# Patient Record
Sex: Male | Born: 1945 | Race: White | Hispanic: No | Marital: Married | Smoking: Current every day smoker
Health system: Southern US, Community
[De-identification: ages and names within clinical notes are randomized; demographics above are authoritative.]

## PROBLEM LIST (undated history)

## (undated) DIAGNOSIS — I10 Essential (primary) hypertension: Secondary | ICD-10-CM

## (undated) DIAGNOSIS — H269 Unspecified cataract: Secondary | ICD-10-CM

## (undated) DIAGNOSIS — I739 Peripheral vascular disease, unspecified: Secondary | ICD-10-CM

## (undated) DIAGNOSIS — M1990 Unspecified osteoarthritis, unspecified site: Secondary | ICD-10-CM

## (undated) DIAGNOSIS — K635 Polyp of colon: Secondary | ICD-10-CM

## (undated) DIAGNOSIS — E785 Hyperlipidemia, unspecified: Secondary | ICD-10-CM

## (undated) DIAGNOSIS — J302 Other seasonal allergic rhinitis: Secondary | ICD-10-CM

## (undated) DIAGNOSIS — C801 Malignant (primary) neoplasm, unspecified: Secondary | ICD-10-CM

## (undated) DIAGNOSIS — F17200 Nicotine dependence, unspecified, uncomplicated: Secondary | ICD-10-CM

## (undated) DIAGNOSIS — J439 Emphysema, unspecified: Secondary | ICD-10-CM

## (undated) HISTORY — DX: Emphysema, unspecified: J43.9

## (undated) HISTORY — DX: Peripheral vascular disease, unspecified: I73.9

## (undated) HISTORY — PX: HEMORRHOID BANDING: SHX5850

## (undated) HISTORY — DX: Unspecified osteoarthritis, unspecified site: M19.90

## (undated) HISTORY — DX: Nicotine dependence, unspecified, uncomplicated: F17.200

## (undated) HISTORY — DX: Polyp of colon: K63.5

## (undated) HISTORY — DX: Unspecified cataract: H26.9

## (undated) HISTORY — PX: CATARACT EXTRACTION, BILATERAL: SHX1313

## (undated) HISTORY — DX: Essential (primary) hypertension: I10

## (undated) HISTORY — DX: Other seasonal allergic rhinitis: J30.2

## (undated) HISTORY — DX: Hyperlipidemia, unspecified: E78.5

## (undated) HISTORY — DX: Malignant (primary) neoplasm, unspecified: C80.1

---

## 1978-12-10 HISTORY — PX: PILONIDAL CYST EXCISION: SHX744

## 1988-12-10 HISTORY — PX: KNEE ARTHROSCOPY: SUR90

## 1988-12-10 HISTORY — PX: OTHER SURGICAL HISTORY: SHX169

## 1993-12-10 DIAGNOSIS — C801 Malignant (primary) neoplasm, unspecified: Secondary | ICD-10-CM

## 1993-12-10 HISTORY — DX: Malignant (primary) neoplasm, unspecified: C80.1

## 1997-12-10 HISTORY — PX: INGUINAL HERNIA REPAIR: SUR1180

## 1997-12-10 HISTORY — PX: HERNIA REPAIR: SHX51

## 1999-01-13 ENCOUNTER — Encounter: Payer: Self-pay | Admitting: Neurology

## 1999-01-13 ENCOUNTER — Ambulatory Visit (HOSPITAL_COMMUNITY): Admission: RE | Admit: 1999-01-13 | Discharge: 1999-01-13 | Payer: Self-pay | Admitting: Neurology

## 2001-09-24 ENCOUNTER — Ambulatory Visit (HOSPITAL_COMMUNITY): Admission: RE | Admit: 2001-09-24 | Discharge: 2001-09-24 | Payer: Self-pay | Admitting: Orthopaedic Surgery

## 2001-09-24 ENCOUNTER — Encounter: Payer: Self-pay | Admitting: Orthopaedic Surgery

## 2002-01-23 ENCOUNTER — Ambulatory Visit (HOSPITAL_COMMUNITY): Admission: RE | Admit: 2002-01-23 | Discharge: 2002-01-23 | Payer: Self-pay | Admitting: *Deleted

## 2002-01-23 ENCOUNTER — Encounter (INDEPENDENT_AMBULATORY_CARE_PROVIDER_SITE_OTHER): Payer: Self-pay | Admitting: Specialist

## 2008-12-10 HISTORY — PX: KNEE ARTHROSCOPY: SUR90

## 2009-02-17 ENCOUNTER — Ambulatory Visit (HOSPITAL_BASED_OUTPATIENT_CLINIC_OR_DEPARTMENT_OTHER): Admission: RE | Admit: 2009-02-17 | Discharge: 2009-02-18 | Payer: Self-pay | Admitting: Orthopaedic Surgery

## 2011-03-22 LAB — POCT HEMOGLOBIN-HEMACUE: Hemoglobin: 15.7 g/dL (ref 13.0–17.0)

## 2011-04-24 NOTE — Op Note (Signed)
NAMERAHIEM, SCHELLINGER NO.:  [id number]   MEDICAL RECORD NO.:  [id number]          PATIENT TYPE:  AMB   LOCATION:  DSC                          FACILITY:  [facility]   PHYSICIAN:  Claude Manges. 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   PROCEDURE:  1. Diagnostic arthroscopy, left knee.  2. Partial medial and lateral meniscectomy.  3. Debridement of 3 compartments with synovectomy.   SURGEON:  Claude Manges. Cleophas Dunker, MD   ANESTHESIA:  General.   COMPLICATIONS:  None.   HISTORY:  A 65 year old gentleman visited the office recently with  recurrent episodes of left knee pain.  His work involves bending,  stooping, and squatting on a repetitive basis and feels like that is  partly responsible for the recurrent pain and swelling.  He has not had  any recent injury or trauma.  I did see him in 2066 with an MRI scan  revealing large effusion with tears of both medial and lateral menisci  without surgical intervention, but he is having recurrent pain to the  point of compromise and it is suggested that we consider arthroscopy.  He does have some evidence of degenerative change on his x-rays.   PROCEDURE:  The patient was comfortable on the operating table.  He was  placed under general anesthesia.  The left lower exam was placed in a  thigh holder.  Leg was then prepped with DuraPrep from thigh holder to  the The ankle.  Sterile draping was performed.   Diagnostic arthroscopy was performed using a medial and lateral  parapatellar tendon puncture site.   Arthroscope was initially placed  laterally.  There was a large, clear yellow joint effusion.  Diagnostic  arthroscopy revealed diffuse beefy red fronds of synovium in all 3  compartments and the intercondylar region.  There was diffuse  chondromalacia of the patella without exposed bone and without tilting.  I did not see any loose bodies.  I used the ArthroCare wand to perform a  synovectomy in the superior pouch and was able to control bleeding with  that as well.  I performed synovectomy in both gutters and the superior  pouch and debrided the patella with the Cuda shaver.   The medial compartment revealed tearing of the meniscus.  There was a  flap tear anteriorly that I debrided with the Cuda shaver and a tear  posteriorly was debrided with the basket forceps and then debrided the  entire remaining meniscus with ArthroCare wand to taper the surface.  There was an area of exposed subchondral bone about the size of a dime  posteromedially in the tibial plateau and diffuse articular cartilage  thinning of the femoral condyle.  There was also diffuse  synovitis that  I debrided with the ArthroCare wand.   The ACL appeared to be intact.  There was a negative anterior drawer  sign.   The lateral compartment revealed some fraying of the lateral meniscus  and a tear of the very posterior horn root that I debrided with the Cuda  shaver.  There was also some synovitis but less than it was medially and  this was debrided with the ArthroCare wand.  There was less synovitis  and degenerative change laterally than there was medially.  The joint  was then reinspected without evidence of loose material.  The puncture  sites were left open and infiltrated with Marcaine with epinephrine.  A  sterile bulky dressing was applied followed by an Ace bandage.   I did record the procedure with a disk and given it to the patient.   PLAN:  Crutches as necessary.  Oxycodone for pain.  Office in 1  week.      Claude Manges. Cleophas Dunker, M.D.  Electronically Signed     PWW/MEDQ  D:  02/17/2009  T:  02/18/2009  Job:  [id number]

## 2011-04-27 NOTE — Procedures (Signed)
[facility]  Patient:    [name], Edward Thornton Visit Number: [id number] MRN: [id number]          Service Type: END Location: ENDO Attending Physician:  Sabino Gasser Dictated by:   Sabino Gasser, M.D. Proc. Date: 01/23/02 Admit Date:  01/23/2002                             Procedure Report  PROCEDURE:  Colonoscopy.  INDICATIONS FOR PROCEDURE:  Rectal bleeding.  ANESTHESIA:  Demerol 100, Versed 10 mg.  DESCRIPTION OF PROCEDURE:  With the patient mildly sedated in the left lateral decubitus position, the Olympus videoscopic colonoscope was inserted in the rectum and passed under direct vision to the cecum identified by the ileocecal valve and appendiceal orifice both of which were photographed. From this point, the colonoscope was slowly withdrawn taking circumferential views of the entire colonic mucosa stopping first at 20 cm from the anal verge at which point, a small polyp was seen, photographed and removed using snare cautery technique on a setting of 20:20 blended current with pullback then to the rectum which appeared normal on direct view and showed four small polyps on retroflexed view all of which were removed some by snare cautery technique and some by hot biopsy forceps technique all with a setting of 20:20 blended current. There was good hemostasis. All the tissue was retrieved. The endoscope was straightened and withdrawn. The patients vital signs and pulse oximeter remained stable. The patient tolerated the procedure well without apparent complications.  FINDINGS:  Multiple polyps in the rectum and at 20 cm from the anal verge. Await biopsy report. The patient will call me for results and followup with me as an outpatient. Dictated by:   Sabino Gasser, M.D. Attending Physician:  Sabino Gasser DD:  01/23/02 TD:  01/23/02 Job: [id number]

## 2011-12-11 HISTORY — PX: COLONOSCOPY: SHX174

## 2012-06-05 ENCOUNTER — Encounter: Payer: Self-pay | Admitting: Gastroenterology

## 2012-07-11 ENCOUNTER — Encounter: Payer: Self-pay | Admitting: Gastroenterology

## 2012-07-11 ENCOUNTER — Ambulatory Visit (AMBULATORY_SURGERY_CENTER): Payer: Medicare Other | Admitting: *Deleted

## 2012-07-11 VITALS — Ht 73.0 in | Wt 194.6 lb

## 2012-07-11 DIAGNOSIS — Z1211 Encounter for screening for malignant neoplasm of colon: Secondary | ICD-10-CM

## 2012-07-11 MED ORDER — MOVIPREP 100 G PO SOLR: ORAL | Status: DC

## 2012-07-11 NOTE — Progress Notes (Signed)
Pt had colonoscopy in 2006 at [facility].  Pt does not remember MD name.  Family hx of colon cancer in father.  Talked with Medical Records associate at [facility]; records will have to come from warehouse.  They will call us on Tuesday 8/6 to let us status of obtaining records.  Scheduled for colonoscopy Friday 8/9 with Dr. Russella Dar.

## 2012-07-14 ENCOUNTER — Telehealth: Payer: Self-pay | Admitting: Gastroenterology

## 2012-07-14 NOTE — Telephone Encounter (Signed)
Pt notified that Movi prep is the number 1 choice of prep that Dr Christella Hartigan uses and it reduces the likely hood of having to repeat the colon because of not being cleaned out enough.  I left a 20 dollar rebate at the front desk for the pt to pick up.  The pt thanked me for my help

## 2012-07-23 ENCOUNTER — Telehealth: Payer: Self-pay | Admitting: Gastroenterology

## 2012-07-23 NOTE — Telephone Encounter (Signed)
Colonsocopy Dr. Virginia Rochester 01/2002 done for rectal bleeding; multiple polyps in rectum and at 20cm from anus; path showed all hyperplastic polyps

## 2012-07-24 ENCOUNTER — Telehealth: Payer: Self-pay | Admitting: Gastroenterology

## 2012-07-24 NOTE — Telephone Encounter (Signed)
See phone note from 07/14/12. Edward Thornton has already spoke with patient and left a rebate at the front desk for patient to pick up. The insurance company already denied the prescription for Movi prep which is why we left a voucher out front for patient to pick up.

## 2012-07-24 NOTE — Telephone Encounter (Signed)
See note from 07/24/12

## 2012-07-25 ENCOUNTER — Other Ambulatory Visit: Payer: Self-pay | Admitting: Gastroenterology

## 2012-07-25 ENCOUNTER — Ambulatory Visit (AMBULATORY_SURGERY_CENTER): Payer: Medicare Other | Admitting: Gastroenterology

## 2012-07-25 ENCOUNTER — Encounter: Payer: Self-pay | Admitting: Gastroenterology

## 2012-07-25 VITALS — BP 130/72 | HR 61 | Temp 94.9°F | Resp 18 | Ht 73.0 in | Wt 194.0 lb

## 2012-07-25 DIAGNOSIS — Z1211 Encounter for screening for malignant neoplasm of colon: Secondary | ICD-10-CM

## 2012-07-25 DIAGNOSIS — K649 Unspecified hemorrhoids: Secondary | ICD-10-CM

## 2012-07-25 DIAGNOSIS — K5730 Diverticulosis of large intestine without perforation or abscess without bleeding: Secondary | ICD-10-CM

## 2012-07-25 MED ORDER — SODIUM CHLORIDE 0.9 % IV SOLN: 500.0000 mL | INTRAVENOUS | Status: DC

## 2012-07-25 NOTE — Progress Notes (Signed)
Patient did not experience any of the following events: a burn prior to discharge; a fall within the facility; wrong site/side/patient/procedure/implant event; or a hospital transfer or hospital admission upon discharge from the facility. (G8907) Patient did not have preoperative order for IV antibiotic SSI prophylaxis. (G8918)  

## 2012-07-25 NOTE — Op Note (Signed)
[facility] [address] [place], [place]  [address]  COLONOSCOPY PROCEDURE REPORT  PATIENT:  Edward Thornton, Edward Thornton  MR#:  [id number] BIRTHDATE:  Apr 26, 1946, 66 yrs. old  GENDER:  male ENDOSCOPIST:  Rachael Fee, MD REF. BY:  Juline Patch, M.D. PROCEDURE DATE:  07/25/2012 PROCEDURE:  Colonoscopy 19147 ASA CLASS:  Class II INDICATIONS:  hyperplastic polyps 2003 MEDICATIONS:   Fentanyl 50 mcg IV, These medications were titrated to patient response per physician's verbal order, Versed 6 mg IV  DESCRIPTION OF PROCEDURE:   After the risks benefits and alternatives of the procedure were thoroughly explained, informed consent was obtained.  Digital rectal exam was performed and revealed no rectal masses.   The LB CF-[id number] endoscope was introduced through the anus and advanced to the cecum, which was identified by both the appendix and ileocecal valve, without limitations.  The quality of the prep was good..  The instrument was then slowly withdrawn as the colon was fully examined. <<PROCEDUREIMAGES>> FINDINGS:  Mild diverticulosis was found in the sigmoid to descending colon segments (see image1).  Internal and external Hemorrhoids were found.  This was otherwise a normal examination of the colon (see image3, image4, and image6).   Retroflexed views in the rectum revealed no abnormalities. COMPLICATIONS:  None  ENDOSCOPIC IMPRESSION: 1) Mild diverticulosis in the sigmoid to descending colon segments 2) Internal and external hemorrhoids 3) Otherwise normal examination; no polyps or cancers  RECOMMENDATIONS: 1) You should continue to follow colorectal cancer screening guidelines for "routine risk" patients with a repeat colonoscopy in 10 years. There is no need for FOBT (stool) testing for at least 5 years.  REPEAT EXAM:  10 years  ______________________________ Rachael Fee, MD  n. eSIGNED:   Rachael Fee at 07/25/2012 08:55 AM  Norvel Richards,  [phone]

## 2012-07-25 NOTE — Patient Instructions (Addendum)
One of your biggest health concerns is your smoking.  This increases your risk for most cancers and serious cardiovascular diseases such as strokes, heart attacks.  You should try your best to stop.  If you need assistance, please contact your PCP or Smoking Cessation Class at [facility] [phone]) or [place] (1-800-QUIT-NOW).   YOU HAD AN ENDOSCOPIC PROCEDURE TODAY AT [facility]: Refer to the procedure report that was given to you for any specific questions about what was found during the examination.  If the procedure report does not answer your questions, please call your gastroenterologist to clarify.  If you requested that your care partner not be given the details of your procedure findings, then the procedure report has been included in a sealed envelope for you to review at your convenience later.  YOU SHOULD EXPECT: Some feelings of bloating in the abdomen. Passage of more gas than usual.  Walking can help get rid of the air that was put into your GI tract during the procedure and reduce the bloating. If you had a lower endoscopy (such as a colonoscopy or flexible sigmoidoscopy) you may notice spotting of blood in your stool or on the toilet paper. If you underwent a bowel prep for your procedure, then you may not have a normal bowel movement for a few days.  DIET: Your first meal following the procedure should be a light meal and then it is ok to progress to your normal diet.  A half-sandwich or bowl of soup is an example of a good first meal.  Heavy or fried foods are harder to digest and may make you feel nauseous or bloated.  Likewise meals heavy in dairy and vegetables can cause extra gas to form and this can also increase the bloating.  Drink plenty of fluids but you should avoid alcoholic beverages for 24 hours.  ACTIVITY: Your care partner should take you home directly after the procedure.  You should plan to take it easy, moving slowly for the rest of  the day.  You can resume normal activity the day after the procedure however you should NOT DRIVE or use heavy machinery for 24 hours (because of the sedation medicines used during the test).    SYMPTOMS TO REPORT IMMEDIATELY: A gastroenterologist can be reached at any hour.  During normal business hours, 8:30 AM to 5:00 PM Monday through Friday, call [phone].  After hours and on weekends, please call the GI answering service at [phone] who will take a message and have the physician on call contact you.   Following lower endoscopy (colonoscopy or flexible sigmoidoscopy):  Excessive amounts of blood in the stool  Significant tenderness or worsening of abdominal pains  Swelling of the abdomen that is new, acute  Fever of 100F or higher  Following upper endoscopy (EGD)  Vomiting of blood or coffee ground material  New chest pain or pain under the shoulder blades  Painful or persistently difficult swallowing  New shortness of breath  Fever of 100F or higher  Black, tarry-looking stools  FOLLOW UP: If any biopsies were taken you will be contacted by phone or by letter within the next 1-3 weeks.  Call your gastroenterologist if you have not heard about the biopsies in 3 weeks.  Our staff will call the home number listed on your records the next business day following your procedure to check on you and address any questions or concerns that you may have at that time regarding  the information given to you following your procedure. This is a courtesy call and so if there is no answer at the home number and we have not heard from you through the emergency physician on call, we will assume that you have returned to your regular daily activities without incident.  SIGNATURES/CONFIDENTIALITY: You and/or your care partner have signed paperwork which will be entered into your electronic medical record.  These signatures attest to the fact that that the information above on your After Visit  Summary has been reviewed and is understood.  Full responsibility of the confidentiality of this discharge information lies with you and/or your care-partner.   HANDOUTS ON HEMORRHOIDS AND DIVERTICULOSIS, HIGH FIBER DIET

## 2012-07-28 ENCOUNTER — Telehealth: Payer: Self-pay | Admitting: *Deleted

## 2012-07-28 NOTE — Telephone Encounter (Signed)
Left message that called for f/u 

## 2012-11-24 ENCOUNTER — Other Ambulatory Visit: Payer: Self-pay | Admitting: Internal Medicine

## 2012-11-24 DIAGNOSIS — F17200 Nicotine dependence, unspecified, uncomplicated: Secondary | ICD-10-CM

## 2012-11-28 ENCOUNTER — Ambulatory Visit
Admission: RE | Admit: 2012-11-28 | Discharge: 2012-11-28 | Disposition: A | Discharge status: Home / Self Care | Payer: Medicare Other | Source: Ambulatory Visit | Attending: Internal Medicine | Admitting: Internal Medicine

## 2012-11-28 DIAGNOSIS — F17200 Nicotine dependence, unspecified, uncomplicated: Secondary | ICD-10-CM

## 2012-12-08 ENCOUNTER — Other Ambulatory Visit (HOSPITAL_COMMUNITY): Payer: Self-pay | Admitting: Internal Medicine

## 2012-12-08 DIAGNOSIS — R0602 Shortness of breath: Secondary | ICD-10-CM

## 2012-12-11 ENCOUNTER — Ambulatory Visit (HOSPITAL_COMMUNITY)
Admission: RE | Admit: 2012-12-11 | Discharge: 2012-12-11 | Disposition: A | Discharge status: Home / Self Care | Payer: Medicare Other | Source: Ambulatory Visit | Attending: Cardiovascular Disease | Admitting: Cardiovascular Disease

## 2012-12-11 DIAGNOSIS — R0602 Shortness of breath: Secondary | ICD-10-CM | POA: Insufficient documentation

## 2013-05-15 ENCOUNTER — Other Ambulatory Visit: Payer: Self-pay | Admitting: Dermatology

## 2014-06-28 ENCOUNTER — Other Ambulatory Visit: Payer: Self-pay | Admitting: Internal Medicine

## 2014-06-28 DIAGNOSIS — F17200 Nicotine dependence, unspecified, uncomplicated: Secondary | ICD-10-CM

## 2014-06-29 ENCOUNTER — Other Ambulatory Visit: Payer: Self-pay | Admitting: Internal Medicine

## 2014-06-29 DIAGNOSIS — M542 Cervicalgia: Secondary | ICD-10-CM

## 2014-06-29 DIAGNOSIS — F17200 Nicotine dependence, unspecified, uncomplicated: Secondary | ICD-10-CM

## 2014-07-02 ENCOUNTER — Ambulatory Visit
Admission: RE | Admit: 2014-07-02 | Discharge: 2014-07-02 | Disposition: A | Discharge status: Home / Self Care | Payer: Medicare Other | Source: Ambulatory Visit | Attending: Internal Medicine | Admitting: Internal Medicine

## 2014-07-02 ENCOUNTER — Encounter (INDEPENDENT_AMBULATORY_CARE_PROVIDER_SITE_OTHER): Payer: Self-pay

## 2014-07-02 ENCOUNTER — Other Ambulatory Visit: Payer: Self-pay | Admitting: Internal Medicine

## 2014-07-02 DIAGNOSIS — F17200 Nicotine dependence, unspecified, uncomplicated: Secondary | ICD-10-CM

## 2014-07-02 DIAGNOSIS — M542 Cervicalgia: Secondary | ICD-10-CM

## 2014-07-05 ENCOUNTER — Other Ambulatory Visit: Payer: Self-pay | Admitting: Internal Medicine

## 2014-07-05 DIAGNOSIS — E041 Nontoxic single thyroid nodule: Secondary | ICD-10-CM

## 2014-07-09 ENCOUNTER — Ambulatory Visit
Admission: RE | Admit: 2014-07-09 | Discharge: 2014-07-09 | Disposition: A | Discharge status: Home / Self Care | Payer: Medicare Other | Source: Ambulatory Visit | Attending: Internal Medicine | Admitting: Internal Medicine

## 2014-07-09 DIAGNOSIS — E041 Nontoxic single thyroid nodule: Secondary | ICD-10-CM

## 2014-07-12 ENCOUNTER — Other Ambulatory Visit: Payer: Self-pay | Admitting: Internal Medicine

## 2014-07-12 DIAGNOSIS — E041 Nontoxic single thyroid nodule: Secondary | ICD-10-CM

## 2014-07-15 ENCOUNTER — Other Ambulatory Visit (HOSPITAL_COMMUNITY)
Admission: RE | Admit: 2014-07-15 | Discharge: 2014-07-15 | Disposition: A | Discharge status: Home / Self Care | Payer: Medicare Other | Source: Ambulatory Visit | Attending: Interventional Radiology | Admitting: Interventional Radiology

## 2014-07-15 ENCOUNTER — Ambulatory Visit
Admission: RE | Admit: 2014-07-15 | Discharge: 2014-07-15 | Disposition: A | Discharge status: Home / Self Care | Payer: Medicare Other | Source: Ambulatory Visit | Attending: Internal Medicine | Admitting: Internal Medicine

## 2014-07-15 DIAGNOSIS — E041 Nontoxic single thyroid nodule: Secondary | ICD-10-CM | POA: Insufficient documentation

## 2015-08-23 ENCOUNTER — Encounter (INDEPENDENT_AMBULATORY_CARE_PROVIDER_SITE_OTHER): Payer: Self-pay

## 2015-08-23 ENCOUNTER — Encounter: Payer: Self-pay | Admitting: Internal Medicine

## 2015-08-23 ENCOUNTER — Ambulatory Visit (INDEPENDENT_AMBULATORY_CARE_PROVIDER_SITE_OTHER): Payer: Medicare Other | Admitting: Internal Medicine

## 2015-08-23 ENCOUNTER — Other Ambulatory Visit (INDEPENDENT_AMBULATORY_CARE_PROVIDER_SITE_OTHER): Payer: Medicare Other

## 2015-08-23 VITALS — BP 112/70 | HR 74 | Temp 98.2°F | Resp 17 | Ht 72.0 in | Wt 189.0 lb

## 2015-08-23 DIAGNOSIS — E785 Hyperlipidemia, unspecified: Secondary | ICD-10-CM

## 2015-08-23 DIAGNOSIS — Z0000 Encounter for general adult medical examination without abnormal findings: Secondary | ICD-10-CM

## 2015-08-23 DIAGNOSIS — I10 Essential (primary) hypertension: Secondary | ICD-10-CM | POA: Diagnosis not present

## 2015-08-23 DIAGNOSIS — Z23 Encounter for immunization: Secondary | ICD-10-CM

## 2015-08-23 LAB — COMPREHENSIVE METABOLIC PANEL
ALK PHOS: 62 U/L (ref 39–117)
ALT: 15 U/L (ref 0–53)
AST: 14 U/L (ref 0–37)
Albumin: 4.2 g/dL (ref 3.5–5.2)
BILIRUBIN TOTAL: 0.9 mg/dL (ref 0.2–1.2)
BUN: 18 mg/dL (ref 6–23)
CO2: 32 meq/L (ref 19–32)
Calcium: 9.9 mg/dL (ref 8.4–10.5)
Chloride: 102 mEq/L (ref 96–112)
Creatinine, Ser: 0.93 mg/dL (ref 0.40–1.50)
GFR: 85.75 mL/min (ref >60.00)
GLUCOSE: 78 mg/dL (ref 70–99)
Potassium: 3.8 mEq/L (ref 3.5–5.1)
SODIUM: 140 meq/L (ref 135–145)
TOTAL PROTEIN: 7.2 g/dL (ref 6.0–8.3)

## 2015-08-23 LAB — LIPID PANEL
CHOL/HDL RATIO: 4
Cholesterol: 148 mg/dL (ref 0–200)
HDL: 40.4 mg/dL (ref >39.00)
LDL Cholesterol: 76 mg/dL (ref 0–99)
NONHDL: 107.15
Triglycerides: 155 mg/dL — ABNORMAL HIGH (ref 0.0–149.0)
VLDL: 31 mg/dL (ref 0.0–40.0)

## 2015-08-23 NOTE — Patient Instructions (Signed)
It was a pleasure to meet you today.   We will check the labs and call you back with the results even if everything is normal.   We have given you the flu shot.  Think about stopping smoking as this is one of the best investments in your future health that you can make.   Smoking Cessation Quitting smoking is important to your health and has many advantages. However, it is not always easy to quit since nicotine is a very addictive drug. Oftentimes, people try 3 times or more before being able to quit. This document explains the best ways for you to prepare to quit smoking. Quitting takes hard work and a lot of effort, but you can do it. ADVANTAGES OF QUITTING SMOKING  You will live longer, feel better, and live better.  Your body will feel the impact of quitting smoking almost immediately.  Within 20 minutes, blood pressure decreases. Your pulse returns to its normal level.  After 8 hours, carbon monoxide levels in the blood return to normal. Your oxygen level increases.  After 24 hours, the chance of having a heart attack starts to decrease. Your breath, hair, and body stop smelling like smoke.  After 48 hours, damaged nerve endings begin to recover. Your sense of taste and smell improve.  After 72 hours, the body is virtually free of nicotine. Your bronchial tubes relax and breathing becomes easier.  After 2 to 12 weeks, lungs can hold more air. Exercise becomes easier and circulation improves.  The risk of having a heart attack, stroke, cancer, or lung disease is greatly reduced.  After 1 year, the risk of coronary heart disease is cut in half.  After 5 years, the risk of stroke falls to the same as a nonsmoker.  After 10 years, the risk of lung cancer is cut in half and the risk of other cancers decreases significantly.  After 15 years, the risk of coronary heart disease drops, usually to the level of a nonsmoker.  If you are pregnant, quitting smoking will improve your  chances of having a healthy baby.  The people you live with, especially any children, will be healthier.  You will have extra money to spend on things other than cigarettes. QUESTIONS TO THINK ABOUT BEFORE ATTEMPTING TO QUIT You may want to talk about your answers with your health care provider.  Why do you want to quit?  If you tried to quit in the past, what helped and what did not?  What will be the most difficult situations for you after you quit? How will you plan to handle them?  Who can help you through the tough times? Your family? Friends? A health care provider?  What pleasures do you get from smoking? What ways can you still get pleasure if you quit? Here are some questions to ask your health care provider:  How can you help me to be successful at quitting?  What medicine do you think would be best for me and how should I take it?  What should I do if I need more help?  What is smoking withdrawal like? How can I get information on withdrawal? GET READY  Set a quit date.  Change your environment by getting rid of all cigarettes, ashtrays, matches, and lighters in your home, car, or work. Do not let people smoke in your home.  Review your past attempts to quit. Think about what worked and what did not. GET SUPPORT AND ENCOURAGEMENT You have a  better chance of being successful if you have help. You can get support in many ways.  Tell your family, friends, and coworkers that you are going to quit and need their support. Ask them not to smoke around you.  Get individual, group, or telephone counseling and support. Programs are available at [employer] and health centers. Call your local health department for information about programs in your area.  Spiritual beliefs and practices may help some smokers quit.  Download a "quit meter" on your computer to keep track of quit statistics, such as how long you have gone without smoking, cigarettes not smoked, and money  saved.  Get a self-help book about quitting smoking and staying off tobacco. [place] yourself from urges to smoke. Talk to someone, go for a walk, or occupy your time with a task.  Change your normal routine. Take a different route to work. Drink tea instead of coffee. Eat breakfast in a different place.  Reduce your stress. Take a hot bath, exercise, or read a book.  Plan something enjoyable to do every day. Reward yourself for not smoking.  Explore interactive web-based programs that specialize in helping you quit. GET MEDICINE AND USE IT CORRECTLY Medicines can help you stop smoking and decrease the urge to smoke. Combining medicine with the above behavioral methods and support can greatly increase your chances of successfully quitting smoking.  Nicotine replacement therapy helps deliver nicotine to your body without the negative effects and risks of smoking. Nicotine replacement therapy includes nicotine gum, lozenges, inhalers, nasal sprays, and skin patches. Some may be available over-the-counter and others require a prescription.  Antidepressant medicine helps people abstain from smoking, but how this works is unknown. This medicine is available by prescription.  Nicotinic receptor partial agonist medicine simulates the effect of nicotine in your brain. This medicine is available by prescription. Ask your health care provider for advice about which medicines to use and how to use them based on your health history. Your health care provider will tell you what side effects to look out for if you choose to be on a medicine or therapy. Carefully read the information on the package. Do not use any other product containing nicotine while using a nicotine replacement product.  RELAPSE OR DIFFICULT SITUATIONS Most relapses occur within the first 3 months after quitting. Do not be discouraged if you start smoking again. Remember, most people try several times  before finally quitting. You may have symptoms of withdrawal because your body is used to nicotine. You may crave cigarettes, be irritable, feel very hungry, cough often, get headaches, or have difficulty concentrating. The withdrawal symptoms are only temporary. They are strongest when you first quit, but they will go away within 10-14 days. To reduce the chances of relapse, try to:  Avoid drinking alcohol. Drinking lowers your chances of successfully quitting.  Reduce the amount of caffeine you consume. Once you quit smoking, the amount of caffeine in your body increases and can give you symptoms, such as a rapid heartbeat, sweating, and anxiety.  Avoid smokers because they can make you want to smoke.  Do not let weight gain distract you. Many smokers will gain weight when they quit, usually less than 10 pounds. Eat a healthy diet and stay active. You can always lose the weight gained after you quit.  Find ways to improve your mood other than smoking. FOR MORE INFORMATION  www.smokefree.gov  Document Released: 11/20/2001 Document Revised: 04/12/2014 Document  Reviewed: 03/06/2012 Exit[employer] Patient Information 2015 [place], [place]. This information is not intended to replace advice given to you by your health care provider. Make sure you discuss any questions you have with your health care provider.

## 2015-08-26 ENCOUNTER — Encounter: Payer: Self-pay | Admitting: Internal Medicine

## 2015-08-26 DIAGNOSIS — I10 Essential (primary) hypertension: Secondary | ICD-10-CM | POA: Insufficient documentation

## 2015-08-26 DIAGNOSIS — E785 Hyperlipidemia, unspecified: Secondary | ICD-10-CM | POA: Insufficient documentation

## 2015-08-26 NOTE — Assessment & Plan Note (Signed)
Continue lipitor 20 mg daily and O3FA. Checking lipid panel today and adjust as indicated.

## 2015-08-26 NOTE — Progress Notes (Signed)
   Subjective:    Patient ID: Edward Thornton, male    DOB: [birth year], 69 y.o.   MRN: [id number]  HPI The patient is a 69 YO man coming in for continuation of his medical conditions with new provider. He has hyperlipidemia (taking lipitor without problems or complications), his blood pressure (taking losartan/hctz and amlodipine and BP at goal, no known complications). He denies any new complaints.   PMH, [facility], social history reviewed and updated.   Review of Systems  Constitutional: Negative for fever, activity change, appetite change, fatigue and unexpected weight change.  HENT: Negative.   Eyes: Negative.   Respiratory: Negative for cough, chest tightness, shortness of breath and wheezing.   Cardiovascular: Negative for chest pain, palpitations and leg swelling.  Gastrointestinal: Negative for nausea, abdominal pain, diarrhea, constipation and abdominal distention.  Musculoskeletal: Negative.   Skin: Negative.   Neurological: Negative.   Psychiatric/Behavioral: Negative.       Objective:   Physical Exam  Constitutional: He is oriented to person, place, and time. He appears well-developed and well-nourished.  HENT:  Head: Normocephalic and atraumatic.  Eyes: EOM are normal.  Neck: Normal range of motion.  Cardiovascular: Normal rate and regular rhythm.   No murmur heard. Pulmonary/Chest: Effort normal and breath sounds normal. No respiratory distress. He has no wheezes. He has no rales.  Abdominal: Soft. Bowel sounds are normal. He exhibits no distension. There is no tenderness. There is no rebound.  Musculoskeletal: He exhibits no edema.  Neurological: He is alert and oriented to person, place, and time. Coordination normal.  Skin: Skin is warm and dry.  Psychiatric: He has a normal mood and affect.   Filed Vitals:   08/23/15 0903  BP: 112/70  Pulse: 74  Temp: 98.2 F (36.8 C)  TempSrc: Oral  Resp: 17  Height: 6' (1.829 m)  Weight: 189 lb (85.73 kg)  SpO2: 94%       Assessment & Plan:  Flu shot given at visit.

## 2015-08-26 NOTE — Assessment & Plan Note (Signed)
BP at goal on losartan/hctz and amlodipine. Checking labs today and adjust as indicated. No side effects and no known complications.

## 2015-11-08 ENCOUNTER — Telehealth: Payer: Self-pay | Admitting: *Deleted

## 2015-11-08 MED ORDER — ATORVASTATIN CALCIUM 20 MG PO TABS: 20.0000 mg | ORAL_TABLET | Freq: Every day | ORAL | Status: DC

## 2015-11-08 NOTE — Telephone Encounter (Signed)
Received call pt states [facility] has been trying to get renewal on his Astorvastatin but haven't received response back from md. Inform pt will send new rx to [facility] .Edward Thornton

## 2016-01-18 ENCOUNTER — Telehealth: Payer: Self-pay | Admitting: *Deleted

## 2016-01-18 MED ORDER — LOSARTAN POTASSIUM-HCTZ 100-25 MG PO TABS: 1.0000 | ORAL_TABLET | Freq: Every day | ORAL | Status: DC

## 2016-01-18 MED ORDER — FISH OIL 1200 MG PO CAPS: 1200.0000 mg | ORAL_CAPSULE | Freq: Every day | ORAL | Status: DC

## 2016-01-18 MED ORDER — ATORVASTATIN CALCIUM 20 MG PO TABS: 20.0000 mg | ORAL_TABLET | Freq: Every day | ORAL | Status: DC

## 2016-01-18 MED ORDER — VITAMIN D 1000 UNITS PO TABS: 1000.0000 [IU] | ORAL_TABLET | Freq: Two times a day (BID) | ORAL | Status: DC

## 2016-01-18 NOTE — Telephone Encounter (Signed)
Left msg on triage stating mail service has change currently using [facility] needing updated scripts sent to them...Edward Thornton

## 2016-01-27 ENCOUNTER — Telehealth: Payer: Self-pay | Admitting: Internal Medicine

## 2016-01-27 ENCOUNTER — Other Ambulatory Visit: Payer: Self-pay | Admitting: Geriatric Medicine

## 2016-01-27 MED ORDER — AMLODIPINE BESYLATE 10 MG PO TABS: 10.0000 mg | ORAL_TABLET | Freq: Every day | ORAL | Status: DC

## 2016-01-27 NOTE — Telephone Encounter (Signed)
Sent to pharmacy 

## 2016-01-27 NOTE — Telephone Encounter (Signed)
Pt called in and need refill on his amLODipine (NORVASC) 10 MG tablet [place]:[id number]  .  This needs to be [facility] to [facility]

## 2016-03-14 DIAGNOSIS — D485 Neoplasm of uncertain behavior of skin: Secondary | ICD-10-CM | POA: Diagnosis not present

## 2016-03-14 DIAGNOSIS — L738 Other specified follicular disorders: Secondary | ICD-10-CM | POA: Diagnosis not present

## 2016-03-14 DIAGNOSIS — D692 Other nonthrombocytopenic purpura: Secondary | ICD-10-CM | POA: Diagnosis not present

## 2016-03-14 DIAGNOSIS — L821 Other seborrheic keratosis: Secondary | ICD-10-CM | POA: Diagnosis not present

## 2016-03-14 DIAGNOSIS — C44519 Basal cell carcinoma of skin of other part of trunk: Secondary | ICD-10-CM | POA: Diagnosis not present

## 2016-03-14 DIAGNOSIS — Z85828 Personal history of other malignant neoplasm of skin: Secondary | ICD-10-CM | POA: Diagnosis not present

## 2016-04-24 DIAGNOSIS — Z961 Presence of intraocular lens: Secondary | ICD-10-CM | POA: Diagnosis not present

## 2016-04-24 DIAGNOSIS — H52203 Unspecified astigmatism, bilateral: Secondary | ICD-10-CM | POA: Diagnosis not present

## 2016-08-01 NOTE — Progress Notes (Signed)
Subjective:   Edward Thornton is a 70 y.o. male who presents for Medicare Annual/Subsequent preventive examination.   C/o of dizziness this am/ came in with wife Took 2 (50Mg) dramamine this am thinking it may help Last night had blurred vision and dizziness; may have fallen if he was standing; resolved; A couple of months ago; similar; dizzy and very nauseated the next day  -Had dry heaves when he raises his head. Better this am but still feels dizzy;  bP 156/ 91 per the wife this am States he drinks 4 beers a week but not daily; Breath ? Sweet;  Rate regular;  Apt with Terri Piedra at 10:45 to evaluate    HRA assessment completed during this visit with Edward Thornton  The Patient was informed that the wellness visit is to identify future health risk and educate and initiate measures that can reduce risk for increased disease through the lifespan.    NO ROS; Medicare Wellness Visit  Psychosocial:  (mother had stroke; HD) Father had stroke and HD  Tobacco current every day smoker; Dr. Sharlet Salina discussed  EtOH 3 cans of beer  PMH  HTN: BP moderately elevated;  Hyperlipidemia-HDL 40; LDL 76  meds not taking Omega 3 fatty acids; Krill oil   Medications reviewed for issues; compliance; otc meds  BMI: 25   Diet Eats fairly healthy; does eat sweets excessively Fried bacon on occasion; Eat breakfast; eats fruit sand vegetables   Dental work: regular   Exercise;   Has been very active all of his life Hasn't walking; still works 2 days a week;is walking up when  [facility];  May go back to swimming x 1 per week   [place] term plan reviewed / does not have to go upstairs  Home: level; barriers; or needs identified as bathroom railing or other review; Shower is in tub; discussed long term planning to age in place  Encourage rails  Fall hx; 0   Given education on "Fall Prevention in the Home" for more safety tips the patient can apply as  appropriate.    Risk for Depression reviewed: Any emotional problems? Anxious, depressed, irritable, sad or blue?  No Denies feeling depressed or hopeless; voices pleasure in daily life How many social activities have you been engaged in within the last 2 weeks? No    Cognitive; terrible at remembering names  Manages checkbook, medications; no failures of task Ad8 score reviewed for issues;  Issues making decisions; no  Less interest in hobbies / activities" no  Repeats questions, stories; family complaining: NO  Trouble using ordinary gadgets; microwave; computer: no  Forgets the month or year: no  Mismanaging finances: no  Missing apt: no but does write them down  Daily problems with thinking of memory NO Ad8 score is 0  MMSE not appropriate unless AD8 score is > 2   Advanced Directive addressed; yes will bring a copy    Counseling Health Maintenance Gaps:  Hepatitis C /educated; deferred to next blood draw  [facility] [place] [facility] orange [facility]   PCV 13  Due/ [facility] confirms prevnar 05/2009 [facility] confirms PSV23 given 06/05/2012 Tdap  / due/ [facility] confirms Tdap given 06/05/2005 Flu shot /due  Colonoscopy; due 07/2022 EKG: 05/2012 Prostate cancer screening: 2016; to be repeated   Hearing: has hearing aids / [facility]   Ophthalmology exam/ dr. Luberta Mutter Glaucoma screening Diabetic  retinopathy if appropriate   Immunizations Due: (Vaccines reviewed and educated regarding any overdue)   Current Care Team reviewed and updated  Cardiac Risk Factors include: advanced age (>71mn, >>37women);dyslipidemia;hypertension;male gender;smoking/ tobacco exposure     Objective:    Vitals: BP 130/60   Pulse 78   Temp 98.1 F (36.7 C)   Ht _0  (1.854 m)   Wt 191 lb 4 oz (86.8 kg)   SpO2 95%   BMI 25.23 kg/m   Body mass index is 25.23 kg/m.  Tobacco History  Smoking Status  . Current Every Day Smoker  . Packs/day: 1.00  . Years:  50.00  . Types: Cigarettes  . Start date: 12/10/1964  Smokeless Tobacco  . Never Used     Ready to quit: Not Answered Counseling given: Not Answered   Past Medical History:  Diagnosis Date  . Arthritis   . [place]   mycosis fungodes  . Hyperlipidemia   . Hypertension    Past Surgical History:  Procedure Laterality Date  . adominal hernia  1990  . HERNIA REPAIR Left 12/10/1997  . KNEE ARTHROSCOPY  1990   right  . KNEE ARTHROSCOPY  2010   left  . PILONIDAL CYST EXCISION  1980   Family History  Problem Relation Age of Onset  . Stroke Mother   . Heart disease Mother   . Stroke Father   . Heart disease Father   . Colon cancer Neg Hx   . Stomach cancer Neg Hx    History  Sexual Activity  . Sexual activity: Not on file    Outpatient Encounter Prescriptions as of 08/02/2016  Medication Sig  . amLODipine (NORVASC) 10 MG tablet Take 1 tablet (10 mg total) by mouth daily.  .Marland Kitchenaspirin 81 MG tablet Take 81 mg by mouth daily.  .Marland Kitchenatorvastatin (LIPITOR) 20 MG tablet Take 1 tablet (20 mg total) by mouth daily.  . cholecalciferol (VITAMIN D) 1000 units tablet Take 1 tablet (1,000 Units total) by mouth 2 (two) times daily. Takes 2 tablets twice daily  . losartan-hydrochlorothiazide (HYZAAR) 100-25 MG tablet Take 1 tablet by mouth daily.  . Omega-3 Fatty Acids (FISH OIL) 1200 MG CAPS Take 1 capsule (1,200 mg total) by mouth daily. Take 1 capsule daily   No facility-administered encounter medications on file as of 08/02/2016.     Activities of Daily Living In your present state of health, do you have any difficulty performing the following activities: 08/02/2016  Hearing? Y  Vision? Y  Difficulty concentrating or making decisions? N  Walking or climbing stairs? N  Dressing or bathing? N  Doing errands, shopping? N  Preparing Food and eating ? N  Using the Toilet? N  In the past six months, have you accidently leaked urine? N  Do you have problems with loss of bowel  control? N  Managing your Medications? N  Managing your Finances? N  Housekeeping or managing your Housekeeping? N  Some recent data might be hidden    Patient Care Team: EHoyt Koch MD as PCP - General (Internal Medicine)   Assessment:    Established and updated Risk reviewed and appropriate referral made or health recommendations: Discussed smoking cessation and given resources;  Educated on increasing exercise. May start swimming [facility]  Assessed and given patient safety information to review as well as fall safety  To see GTerri PiedraPA for dizziness has he presented today  Exercise Activities and Dietary recommendations Current Exercise Habits: Home exercise routine, Type of exercise: walking, Time (Minutes): > 60, Frequency (Times/Week): 2, Weekly Exercise (Minutes/Week): 0  Goals    . Exercise 150 minutes per week (moderate activity)          You walks a lot 2 days a week Will get pedometer;  Older adults aged 41 or older, who are generally fit and have no health conditions that limit their mobility, should try to be active daily and should do: at least 150 minutes of moderate aerobic activity such as cycling or walking every week, and  strength exercises on two or more days a week that work all the major muscles (legs, hips, back, abdomen, chest, shoulders and arms).      OR  75 minutes of vigorous aerobic activity such as running or a game of singles tennis every week, and  strength exercises on two or more days a week that work all the major muscles (legs, hips, back, abdomen, chest, shoulders and arms).     OR  a mix of moderate and vigorous aerobic activity every week. For example, two 30-minute runs, plus 30 minutes of fast walking, equates to 150 minutes of moderate aerobic activity, and  strength exercises on two or more days a week that work all the major muscles (legs, hips, back, abdomen, chest, shoulders and  arms).  A rule of thumb is that one minute of vigorous activity provides the same health benefits as two minutes of moderate activity. You should also try to break up long periods of sitting with light activity, as sedentary behaviour is now considered an independent risk factor for ill health, no matter how much exercise you do. Find out why sitting is bad for your health. Older adults at risk of falls, such as people with weak legs, poor balance and some medical conditions, should do exercises to improve balance and co-ordination on at least two days a week. Examples include yoga, tai chi and dancing.        Fall Risk Fall Risk  08/02/2016  Falls in the past year? No   Depression Screen PHQ 2/9 Scores 08/02/2016  PHQ - 2 Score 0    Cognitive Testing MMSE - Mini Mental State Exam 08/02/2016  Not completed: (No Data)   Ad8 score 0 ; no issues with executive function  Immunization History  Administered Date(s) Administered  . Influenza,inj,Quad PF,36+ Mos 08/23/2015  . Influenza-Unspecified 10/02/2011, 09/14/2013, 09/29/2014  . Pneumococcal-Unspecified 06/05/2009  . Td 06/05/2005  . Zoster 12/10/2008   Screening Tests Health Maintenance  Topic Date Due  . Hepatitis C Screening  11/23/1946  . PNA vac Low Risk Adult (1 of 2 - PCV13) 02/24/2012  . TETANUS/TDAP  06/06/2015  . INFLUENZA VACCINE  07/10/2016  . COLONOSCOPY  07/25/2022  . ZOSTAVAX  Completed      Plan:     Consider swimming!! Discussed getting a pedometer to track exercise   [facility] cessation at [facility]: [phone]-QUIT-NOW [phone]  To see Earnstine Regal for c/o of dizziness.   During the course of the visit the patient was educated and counseled about the following appropriate screening and preventive services:   Vaccines to include Pneumoccal, Influenza, Hepatitis B, Td, Zostavax, HCV/ [facility] listing vaccines; will add to the MRN  Electrocardiogram/ deferred  Cardiovascular Disease/ denies hx / on bp  med   Colorectal cancer screening  07/2022  Diabetes screening/ neg for family hx; last bs normal   Prostate  Cancer Screening/ deferred  Glaucoma screening/ annual  Nutrition counseling / BMI 25   Smoking cessation counseling/ yes;   Always thinks about quitting; discussed coaching if interested and given the telephone number   Patient Instructions (the written plan) was given to the patient.    Wynetta Fines, RN  08/02/2016

## 2016-08-02 ENCOUNTER — Ambulatory Visit (INDEPENDENT_AMBULATORY_CARE_PROVIDER_SITE_OTHER): Payer: Medicare Other

## 2016-08-02 ENCOUNTER — Encounter: Payer: Self-pay | Admitting: Family

## 2016-08-02 ENCOUNTER — Other Ambulatory Visit (INDEPENDENT_AMBULATORY_CARE_PROVIDER_SITE_OTHER): Payer: Medicare Other

## 2016-08-02 ENCOUNTER — Ambulatory Visit (INDEPENDENT_AMBULATORY_CARE_PROVIDER_SITE_OTHER): Payer: Medicare Other | Admitting: Family

## 2016-08-02 VITALS — BP 162/82 | HR 72 | Ht 73.0 in

## 2016-08-02 VITALS — BP 130/60 | HR 78 | Temp 98.1°F | Ht 73.0 in | Wt 191.2 lb

## 2016-08-02 DIAGNOSIS — Z0000 Encounter for general adult medical examination without abnormal findings: Secondary | ICD-10-CM

## 2016-08-02 DIAGNOSIS — R42 Dizziness and giddiness: Secondary | ICD-10-CM | POA: Diagnosis not present

## 2016-08-02 DIAGNOSIS — H8113 Benign paroxysmal vertigo, bilateral: Secondary | ICD-10-CM

## 2016-08-02 DIAGNOSIS — H8110 Benign paroxysmal vertigo, unspecified ear: Secondary | ICD-10-CM | POA: Insufficient documentation

## 2016-08-02 LAB — BASIC METABOLIC PANEL
BUN: 17 mg/dL (ref 6–23)
CHLORIDE: 102 meq/L (ref 96–112)
CO2: 30 mEq/L (ref 19–32)
CREATININE: 0.98 mg/dL (ref 0.40–1.50)
Calcium: 9.3 mg/dL (ref 8.4–10.5)
GFR: 80.5 mL/min (ref >60.00)
Glucose, Bld: 85 mg/dL (ref 70–99)
POTASSIUM: 3.7 meq/L (ref 3.5–5.1)
Sodium: 138 mEq/L (ref 135–145)

## 2016-08-02 LAB — CBC
HEMATOCRIT: 48 % (ref 39.0–52.0)
HEMOGLOBIN: 16.6 g/dL (ref 13.0–17.0)
MCHC: 34.7 g/dL (ref 30.0–36.0)
MCV: 92.8 fl (ref 78.0–100.0)
Platelets: 284 10*3/uL (ref 150.0–400.0)
RBC: 5.17 Mil/uL (ref 4.22–5.81)
RDW: 13.1 % (ref 11.5–15.5)
WBC: 9.6 10*3/uL (ref 4.0–10.5)

## 2016-08-02 MED ORDER — MECLIZINE HCL 25 MG PO TABS: 25.0000 mg | ORAL_TABLET | Freq: Three times a day (TID) | ORAL | 0 refills | Status: DC | PRN

## 2016-08-02 NOTE — Assessment & Plan Note (Addendum)
Symptoms and exam consistent with BPPV. Neurological and ear nose and throat exam otherwise normal. In office EKG shows normal sinus rhythm. Start meclizine. Referral to neuro rehabilitation placed. Follow-up if symptoms worsen or do not improve.

## 2016-08-02 NOTE — Progress Notes (Signed)
Subjective:    Patient ID: Edward Thornton, male    DOB: 1946-05-04, 70 y.o.   MRN: [id number]  Chief Complaint  Patient presents with  . Dizziness    took 2 dramamine, has had an episode of extreme dizziness that caused him to have blurred vision    HPI:  Edward Thornton is a 70 y.o. male who  has a past medical history of Arthritis; Cancer ([place]) (1995); Hyperlipidemia; and Hypertension. and presents today for an acute office visit.   This is a new problem. Associated symptom of dizziness has been going on since this morning with some improvement with the modifying factors of dramamine. Has had episodes previously. Does note that he had an brief instance of blurred vision which lasted about 10-15 seconds and has not occurred since. No previous history of head trauma. Dizziness is described as the room is spinning. No nausea or vomiting. Aggravated by movement.   No Known Allergies   Outpatient Medications Prior to Visit  Medication Sig Dispense Refill  . amLODipine (NORVASC) 10 MG tablet Take 1 tablet (10 mg total) by mouth daily. 90 tablet 3  . aspirin 81 MG tablet Take 81 mg by mouth daily.    Marland Kitchen atorvastatin (LIPITOR) 20 MG tablet Take 1 tablet (20 mg total) by mouth daily. 90 tablet 2  . cholecalciferol (VITAMIN D) 1000 units tablet Take 1 tablet (1,000 Units total) by mouth 2 (two) times daily. Takes 2 tablets twice daily 180 tablet 2  . losartan-hydrochlorothiazide (HYZAAR) 100-25 MG tablet Take 1 tablet by mouth daily. 90 tablet 2  . Omega-3 Fatty Acids (FISH OIL) 1200 MG CAPS Take 1 capsule (1,200 mg total) by mouth daily. Take 1 capsule daily 90 capsule 3   No facility-administered medications prior to visit.       Past Surgical History:  Procedure Laterality Date  . adominal hernia  1990  . HERNIA REPAIR Left 12/10/1997  . KNEE ARTHROSCOPY  1990   right  . KNEE ARTHROSCOPY  2010   left  . PILONIDAL CYST EXCISION  1980      Past Medical History:  Diagnosis Date  .  Arthritis   . 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 Hyperlipidemia   . Hypertension       Review of Systems  Constitutional: Negative for chills and fever.  Respiratory: Negative for chest tightness and shortness of breath.   Cardiovascular: Negative for chest pain, palpitations and leg swelling.  Neurological: Positive for dizziness. Negative for seizures, syncope and light-headedness.      Objective:    BP (!) 162/82 (BP Location: Left Arm, Cuff Size: Normal)   Pulse 72   Ht 6\' 1"  (1.854 m)   BMI 25.23 kg/m  Nursing note and vital signs reviewed.  Physical Exam  Constitutional: He is oriented to person, place, and time. He appears well-developed and well-nourished. No distress.  Eyes: Conjunctivae and EOM are normal. Pupils are equal, round, and reactive to light.  Cardiovascular: Normal rate, regular rhythm, normal heart sounds and intact distal pulses.   Pulmonary/Chest: Effort normal and breath sounds normal.  Neurological: He is alert and oriented to person, place, and time.  Positive modified Dix-Hallpike with nystagmus noted with head straight and lying down. Some symptoms with head to the left and right.  Skin: Skin is warm and dry.  Psychiatric: He has a normal mood and affect. His behavior is normal. Judgment and thought content normal.  Assessment & Plan:   Problem List Items Addressed This Visit      Nervous and Auditory   BPV (benign positional vertigo)    Symptoms and exam consistent with BPPV. Neurological and ear nose and throat exam otherwise normal. In office EKG shows normal sinus rhythm. Start meclizine. Referral to neuro rehabilitation placed. Follow-up if symptoms worsen or do not improve.      Relevant Medications   meclizine (ANTIVERT) 25 MG tablet   Other Relevant Orders   Referral to Neuro Rehab   Basic Metabolic Panel (BMET)   CBC    Other Visit Diagnoses    Dizziness    -  Primary   Relevant Orders   EKG 12-Lead (Completed)         I am having [name] start on meclizine. I am also having him maintain his aspirin, atorvastatin, cholecalciferol, losartan-hydrochlorothiazide, Fish Oil, and amLODipine.   Meds ordered this encounter  Medications  . meclizine (ANTIVERT) 25 MG tablet    Sig: Take 1 tablet (25 mg total) by mouth 3 (three) times daily as needed for dizziness.    Dispense:  30 tablet    Refill:  0    Order Specific Question:   Supervising Provider    Answer:   Pricilla Holm A [id number]     Follow-up: No Follow-up on file.  Edward Po, FNP

## 2016-08-02 NOTE — Patient Instructions (Addendum)
Thank you for choosing [facility].  SUMMARY AND INSTRUCTIONS:  Medication:  Your prescription(s) have been submitted to your pharmacy or been printed and provided for you. Please take as directed and contact our office if you believe you are having problem(s) with the medication(s) or have any questions.  Labs:  Please stop by the lab on the lower level of the building for your blood work. Your results will be released to [place] (or called to you) after review, usually within 72 hours after test completion. If any changes need to be made, you will be notified at that same time.  1.) The lab is open from 7:30am to 5:30 pm Monday-Friday 2.) No appointment is necessary 3.) Fasting (if needed) is 6-8 hours after food and drink; black coffee and water are okay    Referrals:  They will call to schedule your appointment with physical therapy.  Follow up:  If your symptoms worsen or fail to improve, please contact our office for further instruction, or in case of emergency go directly to the emergency room at the closest medical facility.     Benign Positional Vertigo Vertigo is the feeling that you or your surroundings are moving when they are not. Benign positional vertigo is the most common form of vertigo. The cause of this condition is not serious (is benign). This condition is triggered by certain movements and positions (is positional). This condition can be dangerous if it occurs while you are doing something that could endanger you or others, such as driving.  CAUSES In many cases, the cause of this condition is not known. It may be caused by a disturbance in an area of the inner ear that helps your brain to sense movement and balance. This disturbance can be caused by a viral infection (labyrinthitis), head injury, or repetitive motion. RISK FACTORS This condition is more likely to develop in:  Women.  People who are 13 years of age or older. SYMPTOMS Symptoms of this  condition usually happen when you move your head or your eyes in different directions. Symptoms may start suddenly, and they usually last for less than a minute. Symptoms may include:  Loss of balance and falling.  Feeling like you are spinning or moving.  Feeling like your surroundings are spinning or moving.  Nausea and vomiting.  Blurred vision.  Dizziness.  Involuntary eye movement (nystagmus). Symptoms can be mild and cause only slight annoyance, or they can be severe and interfere with daily life. Episodes of benign positional vertigo may return (recur) over time, and they may be triggered by certain movements. Symptoms may improve over time. DIAGNOSIS This condition is usually diagnosed by medical history and a physical exam of the head, neck, and ears. You may be referred to a health care provider who specializes in ear, nose, and throat (ENT) problems (otolaryngologist) or a provider who specializes in disorders of the nervous system (neurologist). You may have additional testing, including:  MRI.  A CT scan.  Eye movement tests. Your health care provider may ask you to change positions quickly while he or she watches you for symptoms of benign positional vertigo, such as nystagmus. Eye movement may be tested with an electronystagmogram (ENG), caloric stimulation, the Dix-Hallpike test, or the roll test.  An electroencephalogram (EEG). This records electrical activity in your brain.  Hearing tests. TREATMENT Usually, your health care provider will treat this by moving your head in specific positions to adjust your inner ear back to normal. Surgery may  be needed in severe cases, but this is rare. In some cases, benign positional vertigo may resolve on its own in 2-4 weeks. HOME CARE INSTRUCTIONS Safety  Move slowly.Avoid sudden body or head movements.  Avoid driving.  Avoid operating heavy machinery.  Avoid doing any tasks that would be dangerous to you or others if a  vertigo episode would occur.  If you have trouble walking or keeping your balance, try using a cane for stability. If you feel dizzy or unstable, sit down right away.  Return to your normal activities as told by your health care provider. Ask your health care provider what activities are safe for you. General Instructions  Take over-the-counter and prescription medicines only as told by your health care provider.  Avoid certain positions or movements as told by your health care provider.  Drink enough fluid to keep your urine clear or pale yellow.  Keep all follow-up visits as told by your health care provider. This is important. SEEK MEDICAL CARE IF:  You have a fever.  Your condition gets worse or you develop new symptoms.  Your family or friends notice any behavioral changes.  Your nausea or vomiting gets worse.  You have numbness or a "pins and needles" sensation. SEEK IMMEDIATE MEDICAL CARE IF:  You have difficulty speaking or moving.  You are always dizzy.  You faint.  You develop severe headaches.  You have weakness in your legs or arms.  You have changes in your hearing or vision.  You develop a stiff neck.  You develop sensitivity to light.   This information is not intended to replace advice given to you by your health care provider. Make sure you discuss any questions you have with your health care provider.   Document Released: 09/03/2006 Document Revised: 08/17/2015 Document Reviewed: 03/21/2015 Elsevier Interactive Patient Education [employer].

## 2016-08-02 NOTE — Progress Notes (Signed)
Medical screening examination/treatment/procedure(s) were performed by non-physician practitioner and as supervising physician I was immediately available for consultation/collaboration. I agree with above. Hoyt Koch, MD  Needs acute visit today for dizziness symptoms.

## 2016-08-02 NOTE — Patient Instructions (Addendum)
[name] , Thank you for taking time to come for your Medicare Wellness Visit. I appreciate your ongoing commitment to your health goals. Please review the following plan we discussed and let me know if I can assist you in the future.   Apt with Terri Piedra today to review for dizziness  Consider swimming!!  Smoking cessation at [facility]: [phone] Classes offered a couple of times a month;  Will work with the patient as far as registration and location  Meds may help; chatix (Varenicline); Zyban (Bupropion SR); Nicotine Replacement (gum; lozenges; patches; etc.) 30 pack yr smoking hx: Educated regarding LDCT; To discuss with MD at next fup. Also educated on AAA screening for men 65-75 who have smoked 1-800-QUIT-NOW (1-502-352-8712   Personal safety issues reviewed:  1.  for risk such as safe community; Can start community watch per [facility] 2.  smoke detector and carbon monoxide detector should be in the home 3.  If you have firearms; keep them in a safe place 4. protection when in the sun; Always wear sunscreen or a hat; It is good to see Dermatologist or have your doctor check your skin annually  5. Driving safety; Keep in the right lane; stay 3 car lengths behind the car in front of you on the highway; look 3 times prior to pulling out; carry your cell phone everywhere you go!   Learn about the Yellow Dot program! Taken from the [facility] office online:  The program consist of a yellow dot that would be placed on the back left window of a motor vehicle and/or on the front door of a residence. The applicant then completes a document containing emergency information along with a photograph of the person of which the history is about. After completion the citizen would then place a copy of the Emergency Information Sheet, of which they have completed inside the glove compartment and/or on the refrigerator within the residence. The yellow dot identifies that  the information is available to First Responders and the common designated locations allows them to know where to retrieve the vital medical information. This information allows First Responders to know how to handle a patient that may have a certain medical condition(s) and what medications the patient may be prescribed. Often times when the patient does not have a family member present this information is unknown. Even patients involved in minor motor vehicle accidents in which the air bag has been deployed are stunned and cannot recall vital medical information for the emergency personnel. This program is also going one step further by allowing patients suffering from Dementia, Alzheimer's, Autism and Cognitive impairments to return a copy of the Information to the [place] and we will add this information to the [place]. This will allow dispatchers to notify first responders of such situations as they are being dispatched to a residence. With this information being available to first responders it allows them to assist people in automobile accidents or those that may have an at home injury or an illness when time is of the essence. This often is referred to as the "[employer] of Emergency Care". Citizens requesting the Yellow Dot Packages should contact Master Corporal Nunzio Cobbs at the [facility] [phone] for the first week of the program and beginning the week after Easter citizens should contact their Scientist, physiological.    These are the goals we discussed: Goals    . Exercise 150 minutes  per week (moderate activity)          You walks a lot 2 days a week Will get pedometer;  Older adults aged 70 or older, who are generally fit and have no health conditions that limit their mobility, should try to be active daily and should do: at least 150 minutes of moderate aerobic activity such as cycling or walking every week, and   strength exercises on two or more days a week that work all the major muscles (legs, hips, back, abdomen, chest, shoulders and arms).      OR  75 minutes of vigorous aerobic activity such as running or a game of singles tennis every week, and  strength exercises on two or more days a week that work all the major muscles (legs, hips, back, abdomen, chest, shoulders and arms).     OR  a mix of moderate and vigorous aerobic activity every week. For example, two 30-minute runs, plus 30 minutes of fast walking, equates to 150 minutes of moderate aerobic activity, and  strength exercises on two or more days a week that work all the major muscles (legs, hips, back, abdomen, chest, shoulders and arms).  A rule of thumb is that one minute of vigorous activity provides the same health benefits as two minutes of moderate activity. You should also try to break up long periods of sitting with light activity, as sedentary behaviour is now considered an independent risk factor for ill health, no matter how much exercise you do. Find out why sitting is bad for your health. Older adults at risk of falls, such as people with weak legs, poor balance and some medical conditions, should do exercises to improve balance and co-ordination on at least two days a week. Examples include yoga, tai chi and dancing.         This is a list of the screening recommended for you and due dates:  Health Maintenance  Topic Date Due  .  Hepatitis C: One time screening is recommended by Center for Disease Control  (CDC) for  adults born from [birth year] through 1964.   1946/07/29  . Pneumonia vaccines (1 of 2 - PCV13) 02/24/2012  . Tetanus Vaccine  06/06/2015  . Flu Shot  07/10/2016  . Colon Cancer Screening  07/25/2022  . Shingles Vaccine  Completed       Fall Prevention in the Home  Falls can cause injuries. They can happen to people of all ages. There are many things you can do to make your home safe and to help prevent  falls.  WHAT CAN I DO ON THE OUTSIDE OF MY HOME?  Regularly fix the edges of walkways and driveways and fix any cracks.  Remove anything that might make you trip as you walk through a door, such as a raised step or threshold.  Trim any bushes or trees on the path to your home.  Use bright outdoor lighting.  Clear any walking paths of anything that might make someone trip, such as rocks or tools.  Regularly check to see if handrails are loose or broken. Make sure that both sides of any steps have handrails.  Any raised decks and porches should have guardrails on the edges.  Have any leaves, snow, or ice cleared regularly.  Use sand or salt on walking paths during winter.  Clean up any spills in your garage right away. This includes oil or grease spills. WHAT CAN I DO IN THE BATHROOM?   Use night  lights.  Install grab bars by the toilet and in the tub and shower. Do not use towel bars as grab bars.  Use non-skid mats or decals in the tub or shower.  If you need to sit down in the shower, use a plastic, non-slip stool.  Keep the floor dry. Clean up any water that spills on the floor as soon as it happens.  Remove soap buildup in the tub or shower regularly.  Attach bath mats securely with double-sided non-slip rug tape.  Do not have throw rugs and other things on the floor that can make you trip. WHAT CAN I DO IN THE BEDROOM?  Use night lights.  Make sure that you have a light by your bed that is easy to reach.  Do not use any sheets or blankets that are too big for your bed. They should not hang down onto the floor.  Have a firm chair that has side arms. You can use this for support while you get dressed.  Do not have throw rugs and other things on the floor that can make you trip. WHAT CAN I DO IN THE KITCHEN?  Clean up any spills right away.  Avoid walking on wet floors.  Keep items that you use a lot in easy-to-reach places.  If you need to reach something  above you, use a strong step stool that has a grab bar.  Keep electrical cords out of the way.  Do not use floor polish or wax that makes floors slippery. If you must use wax, use non-skid floor wax.  Do not have throw rugs and other things on the floor that can make you trip. WHAT CAN I DO WITH MY STAIRS?  Do not leave any items on the stairs.  Make sure that there are handrails on both sides of the stairs and use them. Fix handrails that are broken or loose. Make sure that handrails are as long as the stairways.  Check any carpeting to make sure that it is firmly attached to the stairs. Fix any carpet that is loose or worn.  Avoid having throw rugs at the top or bottom of the stairs. If you do have throw rugs, attach them to the floor with carpet tape.  Make sure that you have a light switch at the top of the stairs and the bottom of the stairs. If you do not have them, ask someone to add them for you. WHAT ELSE CAN I DO TO HELP PREVENT FALLS?  Wear shoes that:  Do not have high heels.  Have rubber bottoms.  Are comfortable and fit you well.  Are closed at the toe. Do not wear sandals.  If you use a stepladder:  Make sure that it is fully opened. Do not climb a closed stepladder.  Make sure that both sides of the stepladder are locked into place.  Ask someone to hold it for you, if possible.  Clearly mark and make sure that you can see:  Any grab bars or handrails.  First and last steps.  Where the edge of each step is.  Use tools that help you move around (mobility aids) if they are needed. These include:  Canes.  Walkers.  Scooters.  Crutches.  Turn on the lights when you go into a dark area. Replace any light bulbs as soon as they burn out.  Set up your furniture so you have a clear path. Avoid moving your furniture around.  If any of your floors  are uneven, fix them.  If there are any pets around you, be aware of where they are.  Review your  medicines with your doctor. Some medicines can make you feel dizzy. This can increase your chance of falling. Ask your doctor what other things that you can do to help prevent falls.   This information is not intended to replace advice given to you by your health care provider. Make sure you discuss any questions you have with your health care provider.   Document Released: 09/22/2009 Document Revised: 04/12/2015 Document Reviewed: 12/31/2014 Elsevier Interactive Patient Education 2016 [place] Maintenance, Male A healthy lifestyle and preventative care can promote health and wellness.  Maintain regular health, dental, and eye exams.  Eat a healthy diet. Foods like vegetables, fruits, whole grains, low-fat dairy products, and lean protein foods contain the nutrients you need and are low in calories. Decrease your intake of foods high in solid fats, added sugars, and salt. Get information about a proper diet from your health care provider, if necessary.  Regular physical exercise is one of the most important things you can do for your health. Most adults should get at least 150 minutes of moderate-intensity exercise (any activity that increases your heart rate and causes you to sweat) each week. In addition, most adults need muscle-strengthening exercises on 2 or more days a week.   Maintain a healthy weight. The body mass index (BMI) is a screening tool to identify possible weight problems. It provides an estimate of body fat based on height and weight. Your health care provider can find your BMI and can help you achieve or maintain a healthy weight. For males 20 years and older:  A BMI below 18.5 is considered underweight.  A BMI of 18.5 to 24.9 is normal.  A BMI of 25 to 29.9 is considered overweight.  A BMI of 30 and above is considered obese.  Maintain normal blood lipids and cholesterol by exercising and minimizing your intake of saturated fat. Eat a balanced diet with  plenty of fruits and vegetables. Blood tests for lipids and cholesterol should begin at age 49 and be repeated every 5 years. If your lipid or cholesterol levels are high, you are over age 24, or you are at high risk for heart disease, you may need your cholesterol levels checked more frequently.Ongoing high lipid and cholesterol levels should be treated with medicines if diet and exercise are not working.  If you smoke, find out from your health care provider how to quit. If you do not use tobacco, do not start.  Lung cancer screening is recommended for adults aged 79-80 years who are at high risk for developing lung cancer because of a history of smoking. A yearly low-dose CT scan of the lungs is recommended for people who have at least a 30-pack-year history of smoking and are current smokers or have quit within the past 15 years. A pack year of smoking is smoking an average of 1 pack of cigarettes a day for 1 year (for example, a 30-pack-year history of smoking could mean smoking 1 pack a day for 30 years or 2 packs a day for 15 years). Yearly screening should continue until the smoker has stopped smoking for at least 15 years. Yearly screening should be stopped for people who develop a health problem that would prevent them from having lung cancer treatment.  If you choose to drink alcohol, do not have more than 2 drinks per day. One  drink is considered to be 12 oz (360 mL) of beer, 5 oz (150 mL) of wine, or 1.5 oz (45 mL) of liquor.  Avoid the use of street drugs. Do not share needles with anyone. Ask for help if you need support or instructions about stopping the use of drugs.  High blood pressure causes heart disease and increases the risk of stroke. High blood pressure is more likely to develop in:  People who have blood pressure in the end of the normal range (100-139/85-89 mm Hg).  People who are overweight or obese.  People who are African American.  If you are 36-37 years of age, have  your blood pressure checked every 3-5 years. If you are 23 years of age or older, have your blood pressure checked every year. You should have your blood pressure measured twice--once when you are at a hospital or clinic, and once when you are not at a hospital or clinic. Record the average of the two measurements. To check your blood pressure when you are not at a hospital or clinic, you can use:  An automated blood pressure machine at a pharmacy.  A home blood pressure monitor.  If you are 75-75 years old, ask your health care provider if you should take aspirin to prevent heart disease.  Diabetes screening involves taking a blood sample to check your fasting blood sugar level. This should be done once every 3 years after age 72 if you are at a normal weight and without risk factors for diabetes. Testing should be considered at a younger age or be carried out more frequently if you are overweight and have at least 1 risk factor for diabetes.  Colorectal cancer can be detected and often prevented. Most routine colorectal cancer screening begins at the age of 10 and continues through age 36. However, your health care provider may recommend screening at an earlier age if you have risk factors for colon cancer. On a yearly basis, your health care provider may provide home test kits to check for hidden blood in the stool. A small camera at the end of a tube may be used to directly examine the colon (sigmoidoscopy or colonoscopy) to detect the earliest forms of colorectal cancer. Talk to your health care provider about this at age 47 when routine screening begins. A direct exam of the colon should be repeated every 5-10 years through age 6, unless early forms of precancerous polyps or small growths are found.  People who are at an increased risk for hepatitis B should be screened for this virus. You are considered at high risk for hepatitis B if:  You were born in a country where hepatitis B occurs often.  Talk with your health care provider about which countries are considered high risk.  Your parents were born in a high-risk country and you have not received a shot to protect against hepatitis B (hepatitis B vaccine).  You have HIV or AIDS.  You use needles to inject street drugs.  You live with, or have sex with, someone who has hepatitis B.  You are a man who has sex with other men (MSM).  You get hemodialysis treatment.  You take certain medicines for conditions like cancer, organ transplantation, and autoimmune conditions.  Hepatitis C blood testing is recommended for all people born from [birth year] through 1964 and any individual with known risk factors for hepatitis C.  Healthy men should no longer receive prostate-specific antigen (PSA) blood tests as part of routine  cancer screening. Talk to your health care provider about prostate cancer screening.  Testicular cancer screening is not recommended for adolescents or adult males who have no symptoms. Screening includes self-exam, a health care provider exam, and other screening tests. Consult with your health care provider about any symptoms you have or any concerns you have about testicular cancer.  Practice safe sex. Use condoms and avoid high-risk sexual practices to reduce the spread of sexually transmitted infections (STIs).  You should be screened for STIs, including gonorrhea and chlamydia if:  You are sexually active and are younger than 24 years.  You are older than 24 years, and your health care provider tells you that you are at risk for this type of infection.  Your sexual activity has changed since you were last screened, and you are at an increased risk for chlamydia or gonorrhea. Ask your health care provider if you are at risk.  If you are at risk of being infected with HIV, it is recommended that you take a prescription medicine daily to prevent HIV infection. This is called pre-exposure prophylaxis (PrEP). You are  considered at risk if:  You are a man who has sex with other men (MSM).  You are a heterosexual man who is sexually active with multiple partners.  You take drugs by injection.  You are sexually active with a partner who has HIV.  Talk with your health care provider about whether you are at high risk of being infected with HIV. If you choose to begin PrEP, you should first be tested for HIV. You should then be tested every 3 months for as long as you are taking PrEP.  Use sunscreen. Apply sunscreen liberally and repeatedly throughout the day. You should seek shade when your shadow is shorter than you. Protect yourself by wearing long sleeves, pants, a wide-brimmed hat, and sunglasses year round whenever you are outdoors.  Tell your health care provider of new moles or changes in moles, especially if there is a change in shape or color. Also, tell your health care provider if a mole is larger than the size of a pencil eraser.  A one-time screening for abdominal aortic aneurysm (AAA) and surgical repair of large AAAs by ultrasound is recommended for men aged 65-75 years who are current or former smokers.  Stay current with your vaccines (immunizations).   This information is not intended to replace advice given to you by your health care provider. Make sure you discuss any questions you have with your health care provider.   Document Released: 05/24/2008 Document Revised: 12/17/2014 Document Reviewed: 04/23/2011 Elsevier Interactive Patient Education [employer].

## 2016-08-07 ENCOUNTER — Other Ambulatory Visit: Payer: Self-pay | Admitting: Internal Medicine

## 2016-08-10 DIAGNOSIS — L820 Inflamed seborrheic keratosis: Secondary | ICD-10-CM | POA: Diagnosis not present

## 2016-08-14 ENCOUNTER — Telehealth: Payer: Self-pay | Admitting: Internal Medicine

## 2016-08-14 NOTE — Telephone Encounter (Signed)
Pt called in and has some question about his last visit with Marya Amsler, Can you call him when you get a chance?

## 2016-08-14 NOTE — Telephone Encounter (Signed)
Returned pts call. Was concerned about referral to neuro rehab. Checked on the status and it has now been forwarded to neuro rehabs work queue. Pt is aware that it could take a couple more days.

## 2016-08-22 ENCOUNTER — Encounter: Payer: Self-pay | Admitting: Physical Therapy

## 2016-08-22 ENCOUNTER — Ambulatory Visit: Payer: Medicare Other | Attending: Family | Admitting: Physical Therapy

## 2016-08-22 DIAGNOSIS — H8111 Benign paroxysmal vertigo, right ear: Secondary | ICD-10-CM | POA: Diagnosis not present

## 2016-08-22 DIAGNOSIS — R42 Dizziness and giddiness: Secondary | ICD-10-CM | POA: Insufficient documentation

## 2016-08-22 NOTE — Patient Instructions (Signed)
Tip Card 1.The goal of habituation training is to assist in decreasing symptoms of vertigo, dizziness, or nausea provoked by specific head and body motions. 2.These exercises may initially increase symptoms; however, be persistent and work through symptoms. With repetition and time, the exercises will assist in reducing or eliminating symptoms. 3.Exercises should be stopped and discussed with the therapist if you experience any of the following: - Sudden change or fluctuation in hearing - New onset of ringing in the ears, or increase in current intensity - Any fluid discharge from the ear - Severe pain in neck or back - Extreme nausea  Copyright  VHI. All rights reserved.  Sit to Side-Lying   Sit on edge of bed. Lie down onto the right side and hold until dizziness stops, plus 20 seconds.  Return to sitting and wait until dizziness stops, plus 20 seconds.  Repeat to the left side. Repeat sequence 5 times per session. Do 2 sessions per day.  Copyright  VHI. All rights reserved.   

## 2016-08-22 NOTE — Therapy (Addendum)
[place] [address] [place], [place], [address] Phone: [phone]   Fax:  [phone]  Physical Therapy Evaluation and Discharge Summary  Patient Details  Name: Edward Thornton MRN: [id number] Date of Birth: Sep 20, 1946 Referring Provider: Mauricio Po, FNP  Encounter Date: 08/22/2016      PT End of Session - 08/22/16 1557    Visit Number 1   Number of Visits 3  eval + 2 visits   Date for PT Re-Evaluation 09/21/16   Authorization Type [employer] Medicare   Authorization Time Period G Codes required   PT Start Time 1443   PT Stop Time 1540   PT Time Calculation (min) 57 min   Activity Tolerance Patient tolerated treatment well   Behavior During Therapy [facility] for tasks assessed/performed      Past Medical History:  Diagnosis Date  . Arthritis   . 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 Hyperlipidemia   . Hypertension     Past Surgical History:  Procedure Laterality Date  . adominal hernia  1990  . HERNIA REPAIR Left 12/10/1997  . KNEE ARTHROSCOPY  1990   right  . KNEE ARTHROSCOPY  2010   left  . PILONIDAL CYST EXCISION  1980    There were no vitals filed for this visit.       Subjective Assessment - 08/22/16 1444    Subjective Vertigo can come on suddenly, but more often can come on gradually. Happens when pt on ladder. "A couple of times during the first bout that bothered me the worst - this was back in the summer - I went to bed and looked at the ceiling and it was spinning. The next morning I was as sick as I've ever been and I could barely walk."   Pertinent History Likes to be called "Edward Thornton".  PMH significant for: HTN, HLD, OA, T-Cell Lymphoma in 1995 (in remission since 1997)   Patient Stated Goals "I don't know a thing about it...so I'm curious to see if this is what I need."   Currently in Pain? No/denies            [facility] PT Assessment - 08/22/16 0001      Assessment   Medical Diagnosis  BPV (benign positional vertigo), bilateral   Referring Provider Mauricio Po, FNP   Onset Date/Surgical Date 08/11/15     Precautions   Precautions None     Restrictions   Weight Bearing Restrictions No     Balance Screen   Has the patient fallen in the past 6 months No   Has the patient had a decrease in activity level because of a fear of falling?  No   Is the patient reluctant to leave their home because of a fear of falling?  No     Home Environment   Living Environment Private residence   Living Arrangements Spouse/significant other   Type of [place]     Prior Function   Level of [place] Retired            Vestibular Assessment - 08/22/16 0001      Vestibular Assessment   General Observation no meclizine for weeks     Symptom Behavior   Type of Dizziness Spinning   Frequency of Dizziness sometimes happens days in a row; but sometimes weeks to months between episodes   Duration of Dizziness within a minute   Aggravating Factors Looking up to the  ceiling;Activity in general   Relieving Factors Closing eyes;Comments;Head stationary  getting out of provoking position     Occulomotor Exam   Occulomotor Alignment Normal   Spontaneous Absent   Gaze-induced Absent   Smooth Pursuits Intact   Saccades Intact   Comment (+) R Head Thrust Test     Vestibulo-Occular Reflex   VOR 1 Head Only (x 1 viewing) Symptomatic with horizontal x1 viewing.   Comment Convergence appears WNL.     Positional Testing   Dix-Hallpike Dix-Hallpike Right   Sidelying Test Sidelying Right;Sidelying Left   Horizontal Canal Testing Horizontal Canal Right;Horizontal Canal Left;Horizontal Canal Right Intensity;Horizontal Canal Left Intensity     Dix-Hallpike Right   Dix-Hallpike Right Duration 15 seconds   Dix-Hallpike Right Symptoms Upbeat, right rotatory nystagmus     Sidelying Right   Sidelying Right Duration 10 seconds   Sidelying Right Symptoms  Upbeat, right rotatory nystagmus     Sidelying Left   Sidelying Left Duration NA   Sidelying Left Symptoms No nystagmus     Horizontal Canal Right   Horizontal Canal Right Duration NA   Horizontal Canal Right Symptoms Normal     Horizontal Canal Left   Horizontal Canal Left Duration NA   Horizontal Canal Left Symptoms Normal               [facility] Adult PT Treatment/Exercise - 08/22/16 0001      Bed Mobility   Bed Mobility Rolling Right;Rolling Left;Sit to Supine;Supine to Sit   Rolling Right 7: Independent   Rolling Left 7: Independent   Supine to Sit 7: Independent     Transfers   Transfers Sit to Stand;Stand to Sit   Sit to Stand 6: Modified independent (Device/Increase time)   Stand to Sit 6: Modified independent (Device/Increase time)     Ambulation/Gait   Ambulation/Gait Yes   Ambulation/Gait Assistance 6: Modified independent (Device/Increase time)   Ambulation Distance (Feet) 50 Feet  x2   Assistive device None   Gait Pattern Within Functional Limits         Vestibular Treatment/Exercise - 08/22/16 0001      Vestibular Treatment/Exercise   Vestibular Treatment Provided Canalith Repositioning;Habituation   Canalith Repositioning Epley Manuever Right   Habituation Exercises Laruth Bouchard Daroff      EPLEY MANUEVER RIGHT   Number of Reps  2   Overall Response Improved Symptoms   Response Details  After R Epley x2, R Sidelying Test: (-) and asymptomatic     Nestor Lewandowsky   Number of Reps  2   Symptom Description  With verbal/tactile cueing for technique, pt effectively performed Nestor Lewandowsky x2 reps               PT Education - 08/22/16 1545    Education provided Yes   Education Details Pt eval findings, goals, and POC. Explained BPPV, what to expect after this session. Initiated habituation for HEP.    Person(s) Educated Patient   Methods Explanation;Demonstration;Handout   Comprehension Verbalized understanding;Returned demonstration           PT Short Term Goals - 08/22/16 1601      PT SHORT TERM GOAL #1   Title STG's = LTG's           PT Long Term Goals - 08/22/16 1602      PT LONG TERM GOAL #1   Title Positional vertigo testing will be negative to indicate resolved BPPV.  (Target: 09/19/16)   Status New  PT LONG TERM GOAL #2   Title Pt will decrease DHI score from 16 to 0 to indicate significant decrease in pt-perceived disability due to dizziness.  (09/19/16)   Status New     PT LONG TERM GOAL #3   Title Pt will independently initiated habituation HEP to maximize functional gains made in PT.  (09/19/16)   Status New     PT LONG TERM GOAL #4   Title Assess strength and balance, if indicated, after vertigo clears.  (09/19/16)   Status New               Plan - 08/29/16 1558    Clinical Impression Statement Pt is a 70 y/o M referred to vestibular PT to address vertigo.  PMH significant for: HTN, HLD, OA, T-Cell Lymphoma in 1995 (in remission since 1997). PT evaluation reveals the following: (+) R Head Thrust Test; impaired VOR; R Dix-Hallpike Test with R upbeating, torsional nystagmus x15 seconds accompanied by vertigo. Based on findings, unable to rule out R posterior canalithiasis and vestibular hypofunction. Therefore, treated with R Epley maneuver x2. Upon reassessment, R Sidelying Test (-) and asymptomatic. Initiated HEP for habituation. Pt will benefit from skilled outpatient PT for up to 2 sessions over a 4-week period to address said impairments.    Rehab Potential Good   Clinical Impairments Affecting Rehab Potential favorable response to initial treatment   PT Frequency Other (comment)  2 visits total   PT Duration 4 weeks   PT Treatment/Interventions ADLs/Self Care Home Management;Canalith Repostioning;Vestibular;Therapeutic activities;Patient/family education;Neuromuscular re-education;Balance training;Therapeutic exercise   PT Next Visit Plan Reassess for BPPV (R PC) and treat prn. Progress  vestibular HEP (consider adding gaze stabilization), as appropriate   Consulted and Agree with Plan of Care Patient      Patient will benefit from skilled therapeutic intervention in order to improve the following deficits and impairments:  Dizziness  Visit Diagnosis: BPPV (benign paroxysmal positional vertigo), right - Plan: PT plan of care cert/re-cert  Dizziness and giddiness - Plan: PT plan of care cert/re-cert      G-Codes - 88/91/69 1605    Functional Assessment Tool Used DHI = 16   Functional Limitation Self care   Self Care Current Status ([id number]) At least 1 percent but less than 20 percent impaired, limited or restricted   Self Care Goal Status (U8280) 0 percent impaired, limited or restricted       Problem List Patient Active Problem List   Diagnosis Date Noted  . BPV (benign positional vertigo) 08/02/2016  . Essential hypertension 08/26/2015  . Hyperlipidemia 08/26/2015    Billie Ruddy, PT, DPT [facility] [address] [place], [place], [address] Phone: [phone]   Fax:  [phone] 08-29-2016, 4:07 PM  Name: JAMELL LAYMON MRN: [id number] Date of Birth: [birth year]    Addendum: PHYSICAL THERAPY DISCHARGE SUMMARY  Visits from Start of Care: 1  Current functional level related to goals / functional outcomes: Unknown, as patient did not return to PT after initial evaluation/treatment.    Remaining deficits: Unknown, as patient did not return to PT after initial evaluation/treatment.   Education / Equipment: See above.  Plan: Patient agrees to discharge.  Patient goals were not met. Patient is being discharged due to being pleased with the current functional level.  ?????            G-Codes - 29-Aug-2016 1605    Functional Assessment Tool Used DHI not captured; pt cancelled remaining PT visits  due to resolved symptoms   Functional Limitation Self care   Self Care Goalt Status [phone]) 0 percent impaired,  limited or restricted   Self Care Discharge Status [phone]) At least 1 percent but less than 20 percent impaired, limited or restricted      Billie Ruddy, PT, [place] [address] [place], [place], [address] Phone: [phone]   Fax:  [phone] 10/25/16, 8:42 AM

## 2016-08-24 ENCOUNTER — Other Ambulatory Visit (INDEPENDENT_AMBULATORY_CARE_PROVIDER_SITE_OTHER): Payer: Medicare Other

## 2016-08-24 ENCOUNTER — Ambulatory Visit (INDEPENDENT_AMBULATORY_CARE_PROVIDER_SITE_OTHER): Payer: Medicare Other | Admitting: Internal Medicine

## 2016-08-24 ENCOUNTER — Encounter: Payer: Self-pay | Admitting: Internal Medicine

## 2016-08-24 VITALS — BP 130/68 | HR 71 | Temp 97.9°F | Ht 73.0 in | Wt 194.0 lb

## 2016-08-24 DIAGNOSIS — Z0000 Encounter for general adult medical examination without abnormal findings: Secondary | ICD-10-CM

## 2016-08-24 DIAGNOSIS — Z23 Encounter for immunization: Secondary | ICD-10-CM | POA: Diagnosis not present

## 2016-08-24 DIAGNOSIS — Z1159 Encounter for screening for other viral diseases: Secondary | ICD-10-CM

## 2016-08-24 DIAGNOSIS — I10 Essential (primary) hypertension: Secondary | ICD-10-CM | POA: Diagnosis not present

## 2016-08-24 DIAGNOSIS — E785 Hyperlipidemia, unspecified: Secondary | ICD-10-CM

## 2016-08-24 LAB — LIPID PANEL
CHOL/HDL RATIO: 4
Cholesterol: 144 mg/dL (ref 0–200)
HDL: 38.1 mg/dL — AB (ref >39.00)
LDL CALC: 70 mg/dL (ref 0–99)
NONHDL: 106.01
TRIGLYCERIDES: 178 mg/dL — AB (ref 0.0–149.0)
VLDL: 35.6 mg/dL (ref 0.0–40.0)

## 2016-08-24 NOTE — Progress Notes (Signed)
   Subjective:    Patient ID: Edward Thornton, male    DOB: May 20, 1946, 70 y.o.   MRN: [id number]  HPI The patient is a 70 YO man coming in for CPE. Already had medicare wellness.   PMH, [facility], social history reviewed and updated.   Review of Systems  Constitutional: Negative for activity change, appetite change, fatigue, fever and unexpected weight change.  HENT: Negative.   Eyes: Negative.   Respiratory: Negative for cough, chest tightness, shortness of breath and wheezing.   Cardiovascular: Negative for chest pain, palpitations and leg swelling.  Gastrointestinal: Negative for abdominal distention, abdominal pain, constipation, diarrhea and nausea.  Musculoskeletal: Negative.   Skin: Negative.   Neurological: Negative.   Psychiatric/Behavioral: Negative.       Objective:   Physical Exam  Constitutional: He is oriented to person, place, and time. He appears well-developed and well-nourished.  HENT:  Head: Normocephalic and atraumatic.  Eyes: EOM are normal.  Neck: Normal range of motion.  Cardiovascular: Normal rate and regular rhythm.   No murmur heard. Pulmonary/Chest: Effort normal and breath sounds normal. No respiratory distress. He has no wheezes. He has no rales.  Abdominal: Soft. Bowel sounds are normal. He exhibits no distension. There is no tenderness. There is no rebound.  Musculoskeletal: He exhibits no edema.  Neurological: He is alert and oriented to person, place, and time. Coordination normal.  Skin: Skin is warm and dry.  Psychiatric: He has a normal mood and affect.   Vitals:   08/24/16 1043  BP: 130/68  Pulse: 71  Temp: 97.9 F (36.6 C)  TempSrc: Oral  SpO2: 94%  Weight: 194 lb (88 kg)  Height: 6\' 1"  (1.854 m)      Assessment & Plan:  Flu shot given at visit.

## 2016-08-24 NOTE — Assessment & Plan Note (Signed)
Checking labs today, adjust as needed. Counseled about sun safety and mole surveillance. Colonoscopy due in 2023. Declines his pneumonia and tetanus shots today. Checking hep c screening.

## 2016-08-24 NOTE — Patient Instructions (Signed)
We are checking the blood work today and have given you the flu shot.   Health Maintenance, Male A healthy lifestyle and preventative care can promote health and wellness.  Maintain regular health, dental, and eye exams.  Eat a healthy diet. Foods like vegetables, fruits, whole grains, low-fat dairy products, and lean protein foods contain the nutrients you need and are low in calories. Decrease your intake of foods high in solid fats, added sugars, and salt. Get information about a proper diet from your health care provider, if necessary.  Regular physical exercise is one of the most important things you can do for your health. Most adults should get at least 150 minutes of moderate-intensity exercise (any activity that increases your heart rate and causes you to sweat) each week. In addition, most adults need muscle-strengthening exercises on 2 or more days a week.   Maintain a healthy weight. The body mass index (BMI) is a screening tool to identify possible weight problems. It provides an estimate of body fat based on height and weight. Your health care provider can find your BMI and can help you achieve or maintain a healthy weight. For males 20 years and older:  A BMI below 18.5 is considered underweight.  A BMI of 18.5 to 24.9 is normal.  A BMI of 25 to 29.9 is considered overweight.  A BMI of 30 and above is considered obese.  Maintain normal blood lipids and cholesterol by exercising and minimizing your intake of saturated fat. Eat a balanced diet with plenty of fruits and vegetables. Blood tests for lipids and cholesterol should begin at age 73 and be repeated every 5 years. If your lipid or cholesterol levels are high, you are over age 17, or you are at high risk for heart disease, you may need your cholesterol levels checked more frequently.Ongoing high lipid and cholesterol levels should be treated with medicines if diet and exercise are not working.  If you smoke, find out from  your health care provider how to quit. If you do not use tobacco, do not start.  Lung cancer screening is recommended for adults aged 62-80 years who are at high risk for developing lung cancer because of a history of smoking. A yearly low-dose CT scan of the lungs is recommended for people who have at least a 30-pack-year history of smoking and are current smokers or have quit within the past 15 years. A pack year of smoking is smoking an average of 1 pack of cigarettes a day for 1 year (for example, a 30-pack-year history of smoking could mean smoking 1 pack a day for 30 years or 2 packs a day for 15 years). Yearly screening should continue until the smoker has stopped smoking for at least 15 years. Yearly screening should be stopped for people who develop a health problem that would prevent them from having lung cancer treatment.  If you choose to drink alcohol, do not have more than 2 drinks per day. One drink is considered to be 12 oz (360 mL) of beer, 5 oz (150 mL) of wine, or 1.5 oz (45 mL) of liquor.  Avoid the use of street drugs. Do not share needles with anyone. Ask for help if you need support or instructions about stopping the use of drugs.  High blood pressure causes heart disease and increases the risk of stroke. High blood pressure is more likely to develop in:  People who have blood pressure in the end of the normal range (100-139/85-89  mm Hg).  People who are overweight or obese.  People who are African American.  If you are 25-36 years of age, have your blood pressure checked every 3-5 years. If you are 33 years of age or older, have your blood pressure checked every year. You should have your blood pressure measured twice--once when you are at a hospital or clinic, and once when you are not at a hospital or clinic. Record the average of the two measurements. To check your blood pressure when you are not at a hospital or clinic, you can use:  An automated blood pressure machine  at a pharmacy.  A home blood pressure monitor.  If you are 30-28 years old, ask your health care provider if you should take aspirin to prevent heart disease.  Diabetes screening involves taking a blood sample to check your fasting blood sugar level. This should be done once every 3 years after age 53 if you are at a normal weight and without risk factors for diabetes. Testing should be considered at a younger age or be carried out more frequently if you are overweight and have at least 1 risk factor for diabetes.  Colorectal cancer can be detected and often prevented. Most routine colorectal cancer screening begins at the age of 6 and continues through age 8. However, your health care provider may recommend screening at an earlier age if you have risk factors for colon cancer. On a yearly basis, your health care provider may provide home test kits to check for hidden blood in the stool. A small camera at the end of a tube may be used to directly examine the colon (sigmoidoscopy or colonoscopy) to detect the earliest forms of colorectal cancer. Talk to your health care provider about this at age 92 when routine screening begins. A direct exam of the colon should be repeated every 5-10 years through age 69, unless early forms of precancerous polyps or small growths are found.  People who are at an increased risk for hepatitis B should be screened for this virus. You are considered at high risk for hepatitis B if:  You were born in a country where hepatitis B occurs often. Talk with your health care provider about which countries are considered high risk.  Your parents were born in a high-risk country and you have not received a shot to protect against hepatitis B (hepatitis B vaccine).  You have HIV or AIDS.  You use needles to inject street drugs.  You live with, or have sex with, someone who has hepatitis B.  You are a man who has sex with other men (MSM).  You get hemodialysis  treatment.  You take certain medicines for conditions like cancer, organ transplantation, and autoimmune conditions.  Hepatitis C blood testing is recommended for all people born from [birth year] through 1965 and any individual with known risk factors for hepatitis C.  Healthy men should no longer receive prostate-specific antigen (PSA) blood tests as part of routine cancer screening. Talk to your health care provider about prostate cancer screening.  Testicular cancer screening is not recommended for adolescents or adult males who have no symptoms. Screening includes self-exam, a health care provider exam, and other screening tests. Consult with your health care provider about any symptoms you have or any concerns you have about testicular cancer.  Practice safe sex. Use condoms and avoid high-risk sexual practices to reduce the spread of sexually transmitted infections (STIs).  You should be screened for STIs, including gonorrhea  and chlamydia if:  You are sexually active and are younger than 24 years.  You are older than 24 years, and your health care provider tells you that you are at risk for this type of infection.  Your sexual activity has changed since you were last screened, and you are at an increased risk for chlamydia or gonorrhea. Ask your health care provider if you are at risk.  If you are at risk of being infected with HIV, it is recommended that you take a prescription medicine daily to prevent HIV infection. This is called pre-exposure prophylaxis (PrEP). You are considered at risk if:  You are a man who has sex with other men (MSM).  You are a heterosexual man who is sexually active with multiple partners.  You take drugs by injection.  You are sexually active with a partner who has HIV.  Talk with your health care provider about whether you are at high risk of being infected with HIV. If you choose to begin PrEP, you should first be tested for HIV. You should then be tested  every 3 months for as long as you are taking PrEP.  Use sunscreen. Apply sunscreen liberally and repeatedly throughout the day. You should seek shade when your shadow is shorter than you. Protect yourself by wearing long sleeves, pants, a wide-brimmed hat, and sunglasses year round whenever you are outdoors.  Tell your health care provider of new moles or changes in moles, especially if there is a change in shape or color. Also, tell your health care provider if a mole is larger than the size of a pencil eraser.  A one-time screening for abdominal aortic aneurysm (AAA) and surgical repair of large AAAs by ultrasound is recommended for men aged 8-75 years who are current or former smokers.  Stay current with your vaccines (immunizations).   This information is not intended to replace advice given to you by your health care provider. Make sure you discuss any questions you have with your health care provider.   Document Released: 05/24/2008 Document Revised: 12/17/2014 Document Reviewed: 04/23/2011 Elsevier Interactive Patient Education [employer].

## 2016-08-24 NOTE — Assessment & Plan Note (Signed)
BP at goal on his losartan/hctz and amlodipine.

## 2016-08-25 LAB — HEPATITIS C ANTIBODY: HCV AB: NEGATIVE

## 2016-08-27 ENCOUNTER — Ambulatory Visit: Payer: Medicare Other | Admitting: Internal Medicine

## 2016-09-04 ENCOUNTER — Encounter: Payer: Self-pay | Admitting: Physical Therapy

## 2016-09-05 ENCOUNTER — Telehealth: Payer: Self-pay | Admitting: Internal Medicine

## 2016-09-05 NOTE — Telephone Encounter (Signed)
Patient states he is suppose to be referred for a lung scan.  Please follow up in regard.

## 2016-09-06 NOTE — Telephone Encounter (Signed)
I didn't see anything about a referral in the last office note and I also didn't see where any lung problems were found. Was this something that you wanted done?

## 2016-09-06 NOTE — Telephone Encounter (Signed)
I thought he was going to check and see if the [place] would do this?

## 2016-09-12 NOTE — Telephone Encounter (Signed)
Tried to reach patient. No answer.  

## 2016-09-20 ENCOUNTER — Telehealth: Payer: Self-pay | Admitting: Emergency Medicine

## 2016-09-20 DIAGNOSIS — F17200 Nicotine dependence, unspecified, uncomplicated: Secondary | ICD-10-CM

## 2016-09-20 NOTE — Telephone Encounter (Signed)
Patients wife called and said that during the last office visit with you on 08/24/16, you discussed a lung scan due to the fact that the patient has been a long term smoker. I looked back at the last visit and checked the chart and I didn't see any orders. Do you remember discussing this with the patient?

## 2016-09-20 NOTE — Telephone Encounter (Signed)
I think he was going to check to see if the [place] would do this.

## 2016-09-20 NOTE — Telephone Encounter (Signed)
Pts wife called and has question about a scan her husband is suppose to have done. She asked that you give her a call back about this. Thanks.

## 2016-09-24 NOTE — Telephone Encounter (Signed)
Patient does not want to go through the [place], he wants you to handle it.

## 2016-09-25 NOTE — Telephone Encounter (Signed)
Placed order for referral and he will hear back in the next 1-2 weeks.

## 2016-09-25 NOTE — Addendum Note (Signed)
Addended by: Pricilla Holm A on: 09/25/2016 12:47 PM   Modules accepted: Orders

## 2016-09-26 NOTE — Telephone Encounter (Signed)
Are you able to do this in Dr. Nathanial Millman absence?

## 2016-09-26 NOTE — Addendum Note (Signed)
Addended by: Pricilla Holm A on: 09/26/2016 12:32 PM   Modules accepted: Orders

## 2016-09-26 NOTE — Telephone Encounter (Signed)
[facility] to be sure pt qualifies and they will schedule CT. The referral needs to be REF [id number].

## 2016-09-27 NOTE — Addendum Note (Signed)
Addended by: Mauricio Po D on: 09/27/2016 09:37 AM   Modules accepted: Orders

## 2016-09-27 NOTE — Telephone Encounter (Signed)
CT scan cancelled. Order for lung cancer screening already placed which should take care of it.

## 2016-10-01 ENCOUNTER — Ambulatory Visit: Payer: Medicare Other

## 2016-10-25 ENCOUNTER — Ambulatory Visit (INDEPENDENT_AMBULATORY_CARE_PROVIDER_SITE_OTHER): Payer: Medicare Other | Admitting: Internal Medicine

## 2016-10-25 ENCOUNTER — Encounter: Payer: Self-pay | Admitting: Internal Medicine

## 2016-10-25 DIAGNOSIS — H6092 Unspecified otitis externa, left ear: Secondary | ICD-10-CM | POA: Insufficient documentation

## 2016-10-25 DIAGNOSIS — H60392 Other infective otitis externa, left ear: Secondary | ICD-10-CM

## 2016-10-25 MED ORDER — NEOMYCIN-POLYMYXIN-HC 3.5-10000-1 OT SUSP: 3.0000 [drp] | Freq: Three times a day (TID) | OTIC | 0 refills | Status: DC

## 2016-10-25 NOTE — Patient Instructions (Signed)
We have sent in the ear drops called corticosporin. Use 3 drops in the left ear 3 times per day for 5 days.

## 2016-10-25 NOTE — Assessment & Plan Note (Signed)
Rx for corticosporin ear drops to use.

## 2016-10-25 NOTE — Progress Notes (Signed)
   Subjective:    Patient ID: Edward Thornton, male    DOB: 1946/06/10, 70 y.o.   MRN: [id number]  HPI The patient is a 70 YO man coming in for left ear pain for 10 days. Denies fevers or chills. No extra nose drainage. Right ear is normal. Decreased hearing. Took some aleve for the pain which was helpful. Wears hearing aids and they hurt his ear now so he has not been wearing it as often in the last 1-2 weeks.   Review of Systems  Constitutional: Negative for activity change, appetite change, fatigue, fever and unexpected weight change.  HENT: Positive for ear discharge and ear pain. Negative for congestion, mouth sores, postnasal drip, rhinorrhea, sinus pain, sinus pressure, sore throat and trouble swallowing.   Eyes: Negative.   Respiratory: Negative.   Cardiovascular: Negative.   Gastrointestinal: Negative.       Objective:   Physical Exam  Constitutional: He is oriented to person, place, and time. He appears well-developed and well-nourished.  HENT:  Head: Normocephalic and atraumatic.  Left ear with wax obstructing, when removed the TM is bulging and redness in the ear canal, right ear normal  Eyes: EOM are normal.  Neck: Normal range of motion.  Cardiovascular: Normal rate and regular rhythm.   Pulmonary/Chest: Effort normal and breath sounds normal.  Abdominal: Soft.  Lymphadenopathy:    He has no cervical adenopathy.  Neurological: He is alert and oriented to person, place, and time.  Skin: Skin is warm and dry.   Vitals:   10/25/16 0936  BP: (!) 160/70  Pulse: 81  Resp: 16  Temp: 98.8 F (37.1 C)  TempSrc: Oral  SpO2: 96%  Weight: 196 lb 1.9 oz (89 kg)  Height: 6' (1.829 m)      Assessment & Plan:

## 2016-10-25 NOTE — Progress Notes (Signed)
Pre visit review using our clinic review tool, if applicable. No additional management support is needed unless otherwise documented below in the visit note. 

## 2016-11-05 ENCOUNTER — Ambulatory Visit (INDEPENDENT_AMBULATORY_CARE_PROVIDER_SITE_OTHER): Payer: Medicare Other | Admitting: Acute Care

## 2016-11-05 ENCOUNTER — Encounter: Payer: Self-pay | Admitting: Acute Care

## 2016-11-05 ENCOUNTER — Ambulatory Visit (INDEPENDENT_AMBULATORY_CARE_PROVIDER_SITE_OTHER)
Admission: RE | Admit: 2016-11-05 | Discharge: 2016-11-05 | Disposition: A | Discharge status: Home / Self Care | Payer: Medicare Other | Source: Ambulatory Visit | Attending: Acute Care | Admitting: Acute Care

## 2016-11-05 ENCOUNTER — Other Ambulatory Visit: Payer: Self-pay | Admitting: Acute Care

## 2016-11-05 DIAGNOSIS — F17210 Nicotine dependence, cigarettes, uncomplicated: Secondary | ICD-10-CM

## 2016-11-05 DIAGNOSIS — Z87891 Personal history of nicotine dependence: Secondary | ICD-10-CM | POA: Diagnosis not present

## 2016-11-05 DIAGNOSIS — F17200 Nicotine dependence, unspecified, uncomplicated: Secondary | ICD-10-CM

## 2016-11-05 NOTE — Progress Notes (Signed)
Shared Decision Making Visit Lung Cancer Screening Program [phone])   Eligibility:  Age 70 y.o.  Pack Years Smoking History Calculation 49 pack year smoking history (# packs/per year x # years smoked)  Recent History of coughing up blood  no  Unexplained weight loss? no ( >Than 15 pounds within the last 6 months )  Prior History Lung / other cancer no (Diagnosis within the last 5 years already requiring surveillance chest CT Scans).  Smoking Status Current Smoker  Former Smokers: Years since quit:NA  Quit Date: NA  Visit Components:  Discussion included one or more decision making aids. yes  Discussion included risk/benefits of screening. yes  Discussion included potential follow up diagnostic testing for abnormal scans. yes  Discussion included meaning and risk of over diagnosis. yes  Discussion included meaning and risk of False Positives. yes  Discussion included meaning of total radiation exposure. yes  Counseling Included:  Importance of adherence to annual lung cancer LDCT screening. yes  Impact of comorbidities on ability to participate in the program. yes  Ability and willingness to under diagnostic treatment. yes  Smoking Cessation Counseling:  Current Smokers:   Discussed importance of smoking cessation. yes  Information about tobacco cessation classes and interventions provided to patient. yes  Patient provided with "ticket" for LDCT Scan. yes  Symptomatic Patient. no  Counseling  Diagnosis Code: Tobacco Use Z72.0  Asymptomatic Patient yes  Counseling (Intermediate counseling: > three minutes counseling) [id number]  Former Smokers:   Discussed the importance of maintaining cigarette abstinence. yes  Diagnosis Code: Personal History of Nicotine Dependence. [id number]  Information about tobacco cessation classes and interventions provided to patient. Yes  Patient provided with "ticket" for LDCT Scan. yes  Written Order for Lung Cancer  Screening with LDCT placed in Epic. Yes (CT Chest Lung Cancer Screening Low Dose W/O CM) [id number] Z12.2-Screening of respiratory organs Z87.891-Personal history of nicotine dependence  I have spent 20 minutes of face to face time with Edward Thornton discussing the risks and benefits of lung cancer screening. We viewed a power point together that explained in detail the above noted topics. We paused at intervals to allow for questions to be asked and answered to ensure understanding.We discussed that the single most powerful action that he can take to decrease his risk of developing lung cancer is to quit smoking. We discussed whether or not he is ready to commit to setting a quit date. He is currently not ready to set a quit date. We discussed options for tools to aid in quitting smoking including nicotine replacement therapy, non-nicotine medications, support groups, Quit Smart classes, and behavior modification. We discussed that often times setting smaller, more achievable goals, such as eliminating 1 cigarette a day for a week and then 2 cigarettes a day for a week can be helpful in slowly decreasing the number of cigarettes smoked. This allows for a sense of accomplishment as well as providing a clinical benefit. I gave him the " Be Stronger Than Your Excuses" card with contact information for community resources, classes, free nicotine replacement therapy, and access to mobile apps, text messaging, and on-line smoking cessation help. I have also given him my card and contact information in the event he needs to contact me. We discussed the time and location of the scan, and that either Edward Thornton, CMA, or I will call with the results within 24-48 hours of receiving them. I have provided him  with a copy of the power point we viewed  as a resource in the event they need reinforcement of the concepts we discussed today in the office. The patient verbalized understanding of all of  the above and had no further  questions upon leaving the office. They have my contact information in the event they have any further questions.   Edward Spatz, NP 11/05/2016

## 2016-11-06 ENCOUNTER — Telehealth: Payer: Self-pay | Admitting: Acute Care

## 2016-11-06 DIAGNOSIS — F17210 Nicotine dependence, cigarettes, uncomplicated: Secondary | ICD-10-CM

## 2016-11-06 NOTE — Telephone Encounter (Signed)
I have called [name] with the results of his low-dose screening CT. His scan was read as a lung RADS 1, which is a negative screening scan. Recommendation per radiology is for continued annual screening with low-dose chest CT in November 2018. I explained that we will order and schedule the scan for November 2018. I did discuss that the scan also indicated that the patient had emphysema. He stated that he was on the aware of this, and he is aware that smoking is the cause. Additionally we discussed that he has aortic atherosclerosis and coronary artery calcification. He is currently on a statin medication per his primary care physician. I will fax a copy of this result to his primary care physician for completeness of his medical care. [name] verbalized understanding of the above and had no further questions at completion of the phone call. He does have my contact information in the event he has any questions in the future.

## 2016-11-10 ENCOUNTER — Other Ambulatory Visit: Payer: Self-pay | Admitting: Internal Medicine

## 2016-12-04 ENCOUNTER — Encounter: Payer: Self-pay | Admitting: Internal Medicine

## 2016-12-04 ENCOUNTER — Ambulatory Visit (INDEPENDENT_AMBULATORY_CARE_PROVIDER_SITE_OTHER): Payer: Medicare Other | Admitting: Internal Medicine

## 2016-12-04 VITALS — BP 124/58 | HR 82 | Temp 98.0°F | Ht 72.0 in | Wt 194.5 lb

## 2016-12-04 DIAGNOSIS — Z23 Encounter for immunization: Secondary | ICD-10-CM | POA: Diagnosis not present

## 2016-12-04 DIAGNOSIS — I10 Essential (primary) hypertension: Secondary | ICD-10-CM

## 2016-12-04 NOTE — Progress Notes (Signed)
   Subjective:    Patient ID: Edward Thornton, male    DOB: 06/24/1946, 70 y.o.   MRN: [id number]  HPI The patient is a 70 YO man coming in for high blood pressure at home. He brought his readings in with him. Some are mildly elevated but most are at goal. He is still taking his losartan/hctz and amlodipine. Admits to some dietary indiscretion in the last several weeks with the holidays. Does eat lunch meats once a day or so. Does not add a lot of salt to things. Denies headaches or chest pains or SOB.   Review of Systems  Constitutional: Negative for activity change, appetite change, diaphoresis, fatigue, fever and unexpected weight change.  Respiratory: Negative.   Cardiovascular: Negative.   Gastrointestinal: Negative.   Musculoskeletal: Negative.   Neurological: Negative.       Objective:   Physical Exam  Constitutional: He is oriented to person, place, and time. He appears well-developed and well-nourished.  HENT:  Head: Normocephalic and atraumatic.  Eyes: EOM are normal.  Cardiovascular: Normal rate and regular rhythm.   Pulmonary/Chest: Effort normal. No respiratory distress. He has no wheezes. He has no rales.  Abdominal: Soft. He exhibits no distension. There is no tenderness. There is no rebound.  Neurological: He is alert and oriented to person, place, and time.  Skin: Skin is warm and dry.   Vitals:   12/04/16 0929  BP: (!) 124/58  Pulse: 82  Temp: 98 F (36.7 C)  TempSrc: Oral  SpO2: 98%  Weight: 194 lb 8 oz (88.2 kg)  Height: 6' (1.829 m)      Assessment & Plan:  Tdap given at visit.

## 2016-12-04 NOTE — Assessment & Plan Note (Signed)
BP at goal in the office and mildly elevated at home. We talked about low salt options and decreasing preserved meats and canned things at home. Continue losartan/hctz and amlodipine.

## 2016-12-04 NOTE — Patient Instructions (Signed)
Think about bringing the meter in to be checked.   Our goal is to have the blood pressure in the 140s for the top number and 80s for the bottom number.   DASH Eating Plan DASH stands for "Dietary Approaches to Stop Hypertension." The DASH eating plan is a healthy eating plan that has been shown to reduce high blood pressure (hypertension). Additional health benefits may include reducing the risk of type 2 diabetes mellitus, heart disease, and stroke. The DASH eating plan may also help with weight loss. What do I need to know about the DASH eating plan? For the DASH eating plan, you will follow these general guidelines:  Choose foods with less than 150 milligrams of sodium per serving (as listed on the food label).  Use salt-free seasonings or herbs instead of table salt or sea salt.  Check with your health care provider or pharmacist before using salt substitutes.  Eat lower-sodium products. These are often labeled as "low-sodium" or "no salt added."  Eat fresh foods. Avoid eating a lot of canned foods.  Eat more vegetables, fruits, and low-fat dairy products.  Choose whole grains. Look for the word "whole" as the first word in the ingredient list.  Choose fish and skinless chicken or [place] more often than red meat. Limit fish, poultry, and meat to 6 oz (170 g) each day.  Limit sweets, desserts, sugars, and sugary drinks.  Choose heart-healthy fats.  Eat more home-cooked food and less restaurant, buffet, and fast food.  Limit fried foods.  Do not fry foods. Cook foods using methods such as baking, boiling, grilling, and broiling instead.  When eating at a restaurant, ask that your food be prepared with less salt, or no salt if possible. What foods can I eat? Seek help from a dietitian for individual calorie needs. Grains  Whole grain or whole wheat bread. Brown rice. Whole grain or whole wheat pasta. Quinoa, bulgur, and whole grain cereals. Low-sodium cereals. Corn or whole  wheat flour tortillas. Whole grain cornbread. Whole grain crackers. Low-sodium crackers. Vegetables  Fresh or frozen vegetables (raw, steamed, roasted, or grilled). Low-sodium or reduced-sodium tomato and vegetable juices. Low-sodium or reduced-sodium tomato sauce and paste. Low-sodium or reduced-sodium canned vegetables. Fruits  All fresh, canned (in natural juice), or frozen fruits. Meat and Other Protein Products  Ground beef (85% or leaner), grass-fed beef, or beef trimmed of fat. Skinless chicken or [place]. Ground chicken or [place]. Pork trimmed of fat. All fish and seafood. Eggs. Dried beans, peas, or lentils. Unsalted nuts and seeds. Unsalted canned beans. Dairy  Low-fat dairy products, such as skim or 1% milk, 2% or reduced-fat cheeses, low-fat ricotta or cottage cheese, or plain low-fat yogurt. Low-sodium or reduced-sodium cheeses. Fats and Oils  Tub margarines without trans fats. Light or reduced-fat mayonnaise and salad dressings (reduced sodium). Avocado. Safflower, olive, or canola oils. Natural peanut or almond butter. Other  Unsalted popcorn and pretzels. The items listed above may not be a complete list of recommended foods or beverages. Contact your dietitian for more options.  What foods are not recommended? Grains  White bread. White pasta. White rice. Refined cornbread. Bagels and croissants. Crackers that contain trans fat. Vegetables  Creamed or fried vegetables. Vegetables in a cheese sauce. Regular canned vegetables. Regular canned tomato sauce and paste. Regular tomato and vegetable juices. Fruits  Canned fruit in light or heavy syrup. Fruit juice. Meat and Other Protein Products  Fatty cuts of meat. Ribs, chicken wings, bacon, sausage, bologna, salami,  chitterlings, fatback, hot dogs, bratwurst, and packaged luncheon meats. Salted nuts and seeds. Canned beans with salt. Dairy  Whole or 2% milk, cream, half-and-half, and cream cheese. Whole-fat or sweetened yogurt.  Full-fat cheeses or blue cheese. Nondairy creamers and whipped toppings. Processed cheese, cheese spreads, or cheese curds. Condiments  Onion and garlic salt, seasoned salt, table salt, and sea salt. Canned and packaged gravies. Worcestershire sauce. Tartar sauce. Barbecue sauce. Teriyaki sauce. Soy sauce, including reduced sodium. Steak sauce. Fish sauce. Oyster sauce. Cocktail sauce. Horseradish. Ketchup and mustard. Meat flavorings and tenderizers. Bouillon cubes. Hot sauce. Tabasco sauce. Marinades. Taco seasonings. Relishes. Fats and Oils  Butter, stick margarine, lard, shortening, ghee, and bacon fat. Coconut, palm kernel, or palm oils. Regular salad dressings. Other  Pickles and olives. Salted popcorn and pretzels. The items listed above may not be a complete list of foods and beverages to avoid. Contact your dietitian for more information.  Where can I find more information? National Heart, Lung, and Blood Institute: travelstabloid.com This information is not intended to replace advice given to you by your health care provider. Make sure you discuss any questions you have with your health care provider. Document Released: 11/15/2011 Document Revised: 05/03/2016 Document Reviewed: 09/30/2013 Elsevier Interactive Patient Education  2017 [employer].

## 2016-12-04 NOTE — Progress Notes (Signed)
Pre visit review using our clinic review tool, if applicable. No additional management support is needed unless otherwise documented below in the visit note. 

## 2017-05-07 DIAGNOSIS — Z961 Presence of intraocular lens: Secondary | ICD-10-CM | POA: Diagnosis not present

## 2017-05-07 DIAGNOSIS — H52203 Unspecified astigmatism, bilateral: Secondary | ICD-10-CM | POA: Diagnosis not present

## 2017-05-07 DIAGNOSIS — H353111 Nonexudative age-related macular degeneration, right eye, early dry stage: Secondary | ICD-10-CM | POA: Diagnosis not present

## 2017-05-10 DIAGNOSIS — D2262 Melanocytic nevi of left upper limb, including shoulder: Secondary | ICD-10-CM | POA: Diagnosis not present

## 2017-05-10 DIAGNOSIS — C44319 Basal cell carcinoma of skin of other parts of face: Secondary | ICD-10-CM | POA: Diagnosis not present

## 2017-05-10 DIAGNOSIS — L821 Other seborrheic keratosis: Secondary | ICD-10-CM | POA: Diagnosis not present

## 2017-05-10 DIAGNOSIS — L570 Actinic keratosis: Secondary | ICD-10-CM | POA: Diagnosis not present

## 2017-05-10 DIAGNOSIS — Z85828 Personal history of other malignant neoplasm of skin: Secondary | ICD-10-CM | POA: Diagnosis not present

## 2017-05-10 DIAGNOSIS — L720 Epidermal cyst: Secondary | ICD-10-CM | POA: Diagnosis not present

## 2017-05-20 DIAGNOSIS — H6123 Impacted cerumen, bilateral: Secondary | ICD-10-CM | POA: Diagnosis not present

## 2017-06-03 DIAGNOSIS — C44319 Basal cell carcinoma of skin of other parts of face: Secondary | ICD-10-CM | POA: Diagnosis not present

## 2017-07-06 ENCOUNTER — Other Ambulatory Visit: Payer: Self-pay | Admitting: Internal Medicine

## 2017-08-26 ENCOUNTER — Encounter: Payer: Medicare Other | Admitting: Internal Medicine

## 2017-09-05 ENCOUNTER — Encounter: Payer: Medicare Other | Admitting: Internal Medicine

## 2017-09-10 ENCOUNTER — Other Ambulatory Visit: Payer: Self-pay | Admitting: Internal Medicine

## 2017-09-10 ENCOUNTER — Other Ambulatory Visit: Payer: Self-pay | Admitting: Family

## 2017-09-16 ENCOUNTER — Other Ambulatory Visit (INDEPENDENT_AMBULATORY_CARE_PROVIDER_SITE_OTHER): Payer: Medicare Other

## 2017-09-16 ENCOUNTER — Ambulatory Visit (INDEPENDENT_AMBULATORY_CARE_PROVIDER_SITE_OTHER): Payer: Medicare Other | Admitting: Internal Medicine

## 2017-09-16 ENCOUNTER — Encounter: Payer: Self-pay | Admitting: Internal Medicine

## 2017-09-16 VITALS — BP 118/70 | HR 73 | Temp 98.3°F | Ht 72.0 in | Wt 193.0 lb

## 2017-09-16 DIAGNOSIS — I10 Essential (primary) hypertension: Secondary | ICD-10-CM | POA: Diagnosis not present

## 2017-09-16 DIAGNOSIS — E785 Hyperlipidemia, unspecified: Secondary | ICD-10-CM | POA: Diagnosis not present

## 2017-09-16 DIAGNOSIS — Z0000 Encounter for general adult medical examination without abnormal findings: Secondary | ICD-10-CM

## 2017-09-16 DIAGNOSIS — Z23 Encounter for immunization: Secondary | ICD-10-CM | POA: Diagnosis not present

## 2017-09-16 LAB — LIPID PANEL
CHOL/HDL RATIO: 4
CHOLESTEROL: 138 mg/dL (ref 0–200)
HDL: 35.7 mg/dL — ABNORMAL LOW (ref >39.00)
NONHDL: 102.18
TRIGLYCERIDES: 208 mg/dL — AB (ref 0.0–149.0)
VLDL: 41.6 mg/dL — AB (ref 0.0–40.0)

## 2017-09-16 LAB — COMPREHENSIVE METABOLIC PANEL
ALT: 13 U/L (ref 0–53)
AST: 12 U/L (ref 0–37)
Albumin: 3.9 g/dL (ref 3.5–5.2)
Alkaline Phosphatase: 56 U/L (ref 39–117)
BUN: 17 mg/dL (ref 6–23)
CALCIUM: 9.5 mg/dL (ref 8.4–10.5)
CHLORIDE: 105 meq/L (ref 96–112)
CO2: 28 meq/L (ref 19–32)
Creatinine, Ser: 1 mg/dL (ref 0.40–1.50)
GFR: 78.39 mL/min (ref >60.00)
GLUCOSE: 114 mg/dL — AB (ref 70–99)
POTASSIUM: 3.7 meq/L (ref 3.5–5.1)
Sodium: 139 mEq/L (ref 135–145)
Total Bilirubin: 0.6 mg/dL (ref 0.2–1.2)
Total Protein: 6.8 g/dL (ref 6.0–8.3)

## 2017-09-16 LAB — CBC
HCT: 45.3 % (ref 39.0–52.0)
HEMOGLOBIN: 15.2 g/dL (ref 13.0–17.0)
MCHC: 33.6 g/dL (ref 30.0–36.0)
MCV: 95.7 fl (ref 78.0–100.0)
PLATELETS: 268 10*3/uL (ref 150.0–400.0)
RBC: 4.73 Mil/uL (ref 4.22–5.81)
RDW: 12.6 % (ref 11.5–15.5)
WBC: 8.9 10*3/uL (ref 4.0–10.5)

## 2017-09-16 LAB — LDL CHOLESTEROL, DIRECT: Direct LDL: 85 mg/dL

## 2017-09-16 NOTE — Patient Instructions (Signed)
We are checking the labs today and have given you the flu shot and the pneumonia shot.    Health Maintenance, Male A healthy lifestyle and preventive care is important for your health and wellness. Ask your health care provider about what schedule of regular examinations is right for you. What should I know about weight and diet? Eat a Healthy Diet  Eat plenty of vegetables, fruits, whole grains, low-fat dairy products, and lean protein.  Do not eat a lot of foods high in solid fats, added sugars, or salt.  Maintain a Healthy Weight Regular exercise can help you achieve or maintain a healthy weight. You should:  Do at least 150 minutes of exercise each week. The exercise should increase your heart rate and make you sweat (moderate-intensity exercise).  Do strength-training exercises at least twice a week.  Watch Your Levels of Cholesterol and Blood Lipids  Have your blood tested for lipids and cholesterol every 5 years starting at 70 years of age. If you are at high risk for heart disease, you should start having your blood tested when you are 70 years old. You may need to have your cholesterol levels checked more often if: ? Your lipid or cholesterol levels are high. ? You are older than 70 years of age. ? You are at high risk for heart disease.  What should I know about cancer screening? Many types of cancers can be detected early and may often be prevented. Lung Cancer  You should be screened every year for lung cancer if: ? You are a current smoker who has smoked for at least 30 years. ? You are a former smoker who has quit within the past 15 years.  Talk to your health care provider about your screening options, when you should start screening, and how often you should be screened.  Colorectal Cancer  Routine colorectal cancer screening usually begins at 70 years of age and should be repeated every 5-10 years until you are 70 years old. You may need to be screened more  often if early forms of precancerous polyps or small growths are found. Your health care provider may recommend screening at an earlier age if you have risk factors for colon cancer.  Your health care provider may recommend using home test kits to check for hidden blood in the stool.  A small camera at the end of a tube can be used to examine your colon (sigmoidoscopy or colonoscopy). This checks for the earliest forms of colorectal cancer.  Prostate and Testicular Cancer  Depending on your age and overall health, your health care provider may do certain tests to screen for prostate and testicular cancer.  Talk to your health care provider about any symptoms or concerns you have about testicular or prostate cancer.  Skin Cancer  Check your skin from head to toe regularly.  Tell your health care provider about any new moles or changes in moles, especially if: ? There is a change in a mole's size, shape, or color. ? You have a mole that is larger than a pencil eraser.  Always use sunscreen. Apply sunscreen liberally and repeat throughout the day.  Protect yourself by wearing long sleeves, pants, a wide-brimmed hat, and sunglasses when outside.  What should I know about heart disease, diabetes, and high blood pressure?  If you are 9-51 years of age, have your blood pressure checked every 3-5 years. If you are 62 years of age or older, have your blood pressure checked every  year. You should have your blood pressure measured twice-once when you are at a hospital or clinic, and once when you are not at a hospital or clinic. Record the average of the two measurements. To check your blood pressure when you are not at a hospital or clinic, you can use: ? An automated blood pressure machine at a pharmacy. ? A home blood pressure monitor.  Talk to your health care provider about your target blood pressure.  If you are between 47-9 years old, ask your health care provider if you should take  aspirin to prevent heart disease.  Have regular diabetes screenings by checking your fasting blood sugar level. ? If you are at a normal weight and have a low risk for diabetes, have this test once every three years after the age of 33. ? If you are overweight and have a high risk for diabetes, consider being tested at a younger age or more often.  A one-time screening for abdominal aortic aneurysm (AAA) by ultrasound is recommended for men aged 32-75 years who are current or former smokers. What should I know about preventing infection? Hepatitis B If you have a higher risk for hepatitis B, you should be screened for this virus. Talk with your health care provider to find out if you are at risk for hepatitis B infection. Hepatitis C Blood testing is recommended for:  Everyone born from [birth year] through 1964.  Anyone with known risk factors for hepatitis C.  Sexually Transmitted Diseases (STDs)  You should be screened each year for STDs including gonorrhea and chlamydia if: ? You are sexually active and are younger than 70 years of age. ? You are older than 70 years of age and your health care provider tells you that you are at risk for this type of infection. ? Your sexual activity has changed since you were last screened and you are at an increased risk for chlamydia or gonorrhea. Ask your health care provider if you are at risk.  Talk with your health care provider about whether you are at high risk of being infected with HIV. Your health care provider may recommend a prescription medicine to help prevent HIV infection.  What else can I do?  Schedule regular health, dental, and eye exams.  Stay current with your vaccines (immunizations).  Do not use any tobacco products, such as cigarettes, chewing tobacco, and e-cigarettes. If you need help quitting, ask your health care provider.  Limit alcohol intake to no more than 2 drinks per day. One drink equals 12 ounces of beer, 5 ounces of  wine, or 1 ounces of hard liquor.  Do not use street drugs.  Do not share needles.  Ask your health care provider for help if you need support or information about quitting drugs.  Tell your health care provider if you often feel depressed.  Tell your health care provider if you have ever been abused or do not feel safe at home. This information is not intended to replace advice given to you by your health care provider. Make sure you discuss any questions you have with your health care provider. Document Released: 05/24/2008 Document Revised: 07/25/2016 Document Reviewed: 08/30/2015 Elsevier Interactive Patient Education  [employer].

## 2017-09-16 NOTE — Progress Notes (Signed)
   Subjective:    Patient ID: Edward Thornton, male    DOB: 04/26/1946, 71 y.o.   MRN: [id number]  HPI Here for medicare wellness and physical, no new complaints. Please see A/P for status and treatment of chronic medical problems.   Diet: heart healthy Physical activity: sedentary Depression/mood screen: negative Hearing: intact to whispered voice with aids from [place] Visual acuity: grossly normal, performs annual eye exam  ADLs: capable Fall risk: none Home safety: good Cognitive evaluation: intact to orientation, naming, recall and repetition EOL planning: adv directives discussed  I have personally reviewed and have noted 1. The patient's medical and social history - reviewed today no changes 2. Their use of alcohol, tobacco or illicit drugs 3. Their current medications and supplements 4. The patient's functional ability including ADL's, fall risks, home safety risks and hearing or visual impairment. 5. Diet and physical activities 6. Evidence for depression or mood disorders 7. Care team reviewed and updated (available in snapshot)  Review of Systems  Constitutional: Negative.   HENT: Negative.   Eyes: Negative.   Respiratory: Negative for cough, chest tightness and shortness of breath.   Cardiovascular: Negative for chest pain, palpitations and leg swelling.  Gastrointestinal: Negative for abdominal distention, abdominal pain, constipation, diarrhea, nausea and vomiting.  Musculoskeletal: Positive for arthralgias. Negative for back pain, gait problem, joint swelling and myalgias.  Skin: Negative.   Neurological: Negative.   Psychiatric/Behavioral: Negative.       Objective:   Physical Exam  Constitutional: He is oriented to person, place, and time. He appears well-developed and well-nourished.  HENT:  Head: Normocephalic and atraumatic.  Eyes: EOM are normal.  Neck: Normal range of motion.  Cardiovascular: Normal rate and regular rhythm.   Pulmonary/Chest: Effort normal  and breath sounds normal. No respiratory distress. He has no wheezes. He has no rales.  Abdominal: Soft. Bowel sounds are normal. He exhibits no distension. There is no tenderness. There is no rebound.  Musculoskeletal: He exhibits no edema.  Neurological: He is alert and oriented to person, place, and time. Coordination normal.  Skin: Skin is warm and dry.  Psychiatric: He has a normal mood and affect.   Vitals:   09/16/17 1415  BP: 118/70  Pulse: 73  Temp: 98.3 F (36.8 C)  TempSrc: Oral  SpO2: 97%  Weight: 193 lb (87.5 kg)  Height: 6' (1.829 m)      Assessment & Plan:  Flu shot and prevnar 13 given at visit.

## 2017-09-17 NOTE — Assessment & Plan Note (Signed)
BP at goal and adjust losartan/hctz as needed after CMP.

## 2017-09-17 NOTE — Assessment & Plan Note (Addendum)
Checking lipid panel and adjust lipitor 20 mg daily as needed. 

## 2017-09-17 NOTE — Assessment & Plan Note (Signed)
Flu and prevnar 13 given at visit. Colonoscopy up to date. Tetanus is up to date. Counseled about shingrix. Counseled about sun safety and mole surveillance. Given 10 year screening recommendations.

## 2017-10-15 DIAGNOSIS — H6123 Impacted cerumen, bilateral: Secondary | ICD-10-CM | POA: Diagnosis not present

## 2017-10-22 ENCOUNTER — Telehealth: Payer: Self-pay | Admitting: Internal Medicine

## 2017-10-22 NOTE — Telephone Encounter (Signed)
Rec'd from [facility] forwarded 4 pages to [employer]

## 2017-11-06 ENCOUNTER — Ambulatory Visit (INDEPENDENT_AMBULATORY_CARE_PROVIDER_SITE_OTHER)
Admission: RE | Admit: 2017-11-06 | Discharge: 2017-11-06 | Disposition: A | Discharge status: Home / Self Care | Payer: Medicare Other | Source: Ambulatory Visit | Attending: Acute Care | Admitting: Acute Care

## 2017-11-06 DIAGNOSIS — Z87891 Personal history of nicotine dependence: Secondary | ICD-10-CM | POA: Diagnosis not present

## 2017-11-06 DIAGNOSIS — F17210 Nicotine dependence, cigarettes, uncomplicated: Secondary | ICD-10-CM

## 2017-11-06 DIAGNOSIS — J439 Emphysema, unspecified: Secondary | ICD-10-CM | POA: Diagnosis not present

## 2017-11-14 ENCOUNTER — Telehealth: Payer: Self-pay | Admitting: Acute Care

## 2017-11-14 DIAGNOSIS — Z122 Encounter for screening for malignant neoplasm of respiratory organs: Secondary | ICD-10-CM

## 2017-11-14 DIAGNOSIS — F17210 Nicotine dependence, cigarettes, uncomplicated: Secondary | ICD-10-CM

## 2017-11-14 NOTE — Telephone Encounter (Signed)
Pt informed of CT results per Sarah Groce, NP.  PT verbalized understanding.  Copy sent to PCP.  Order placed for 1 yr f/u CT.  

## 2017-12-30 ENCOUNTER — Other Ambulatory Visit: Payer: Self-pay | Admitting: Internal Medicine

## 2018-03-19 ENCOUNTER — Other Ambulatory Visit: Payer: Self-pay | Admitting: Internal Medicine

## 2018-05-12 ENCOUNTER — Telehealth: Payer: Self-pay | Admitting: Emergency Medicine

## 2018-05-12 NOTE — Telephone Encounter (Signed)
Called patient to schedule AWV. Patient will call back at later date to schedule. 

## 2018-05-19 DIAGNOSIS — H353111 Nonexudative age-related macular degeneration, right eye, early dry stage: Secondary | ICD-10-CM | POA: Diagnosis not present

## 2018-05-19 DIAGNOSIS — Z961 Presence of intraocular lens: Secondary | ICD-10-CM | POA: Diagnosis not present

## 2018-05-19 DIAGNOSIS — H52203 Unspecified astigmatism, bilateral: Secondary | ICD-10-CM | POA: Diagnosis not present

## 2018-06-04 ENCOUNTER — Other Ambulatory Visit: Payer: Self-pay | Admitting: Internal Medicine

## 2018-06-20 DIAGNOSIS — D225 Melanocytic nevi of trunk: Secondary | ICD-10-CM | POA: Diagnosis not present

## 2018-06-20 DIAGNOSIS — L570 Actinic keratosis: Secondary | ICD-10-CM | POA: Diagnosis not present

## 2018-06-20 DIAGNOSIS — Z85828 Personal history of other malignant neoplasm of skin: Secondary | ICD-10-CM | POA: Diagnosis not present

## 2018-06-20 DIAGNOSIS — L821 Other seborrheic keratosis: Secondary | ICD-10-CM | POA: Diagnosis not present

## 2018-06-20 DIAGNOSIS — D2261 Melanocytic nevi of right upper limb, including shoulder: Secondary | ICD-10-CM | POA: Diagnosis not present

## 2018-06-23 ENCOUNTER — Other Ambulatory Visit: Payer: Medicare Other

## 2018-06-23 ENCOUNTER — Ambulatory Visit (INDEPENDENT_AMBULATORY_CARE_PROVIDER_SITE_OTHER): Payer: Medicare Other | Admitting: Internal Medicine

## 2018-06-23 ENCOUNTER — Encounter: Payer: Self-pay | Admitting: Internal Medicine

## 2018-06-23 ENCOUNTER — Ambulatory Visit (INDEPENDENT_AMBULATORY_CARE_PROVIDER_SITE_OTHER)
Admission: RE | Admit: 2018-06-23 | Discharge: 2018-06-23 | Disposition: A | Discharge status: Home / Self Care | Payer: Medicare Other | Source: Ambulatory Visit | Attending: Internal Medicine | Admitting: Internal Medicine

## 2018-06-23 VITALS — BP 120/60 | HR 72 | Temp 97.5°F | Ht 72.0 in | Wt 190.0 lb

## 2018-06-23 DIAGNOSIS — M79641 Pain in right hand: Secondary | ICD-10-CM

## 2018-06-23 NOTE — Patient Instructions (Signed)
Turmeric is a vitamin to think about trying to help with arthritis.   We are checking the labs and the x-ray today.

## 2018-06-23 NOTE — Progress Notes (Signed)
   Subjective:    Patient ID: Edward Thornton, male    DOB: [birth year], 72 y.o.   MRN: [id number]  HPI The patient is a 72 YO man coming in for right 3rd finger pain. Started about 2-3 months ago. Usually worse in the morning and stiff. Improves as the day goes on. He has not taken anything specific for it. Mother had RA. He has never been tested. Mostly the PIP joints. He does see some swelling in the finger itself and around the PIP joint. He denies swelling in the wrist or hand. He has rarely had the finger to lock up with pain to loosen. He denies cold change or positional pain. Overall it is improved in the last 2 days but previous to that was stable and moderate in nature. He is some weaker in the morning and as the day goes on he is able to do all activities.   Review of Systems  Constitutional: Positive for activity change. Negative for appetite change, fatigue, fever and unexpected weight change.  Respiratory: Negative.   Cardiovascular: Negative.   Musculoskeletal: Positive for arthralgias, joint swelling and myalgias. Negative for back pain.  Skin: Negative.   Neurological: Negative for syncope, weakness and numbness.      Objective:   Physical Exam  Constitutional: He is oriented to person, place, and time. He appears well-developed and well-nourished.  HENT:  Head: Normocephalic and atraumatic.  Eyes: EOM are normal.  Neck: Normal range of motion.  Cardiovascular: Normal rate and regular rhythm.  Pulmonary/Chest: Effort normal and breath sounds normal. No respiratory distress. He has no wheezes. He has no rales.  Abdominal: Soft. He exhibits no distension. There is no tenderness. There is no rebound.  Musculoskeletal: He exhibits no edema.  Some increase in size of the PIP joint right 3rd finger, no pain on exam, ROM full.   Neurological: He is alert and oriented to person, place, and time. Coordination normal.  Skin: Skin is warm and dry.  Psychiatric: He has a normal mood and  affect.   Vitals:   06/23/18 0916  BP: 120/60  Pulse: 72  Temp: (!) 97.5 F (36.4 C)  TempSrc: Oral  SpO2: 95%  Weight: 190 lb (86.2 kg)  Height: 6' (1.829 m)      Assessment & Plan:

## 2018-06-23 NOTE — Assessment & Plan Note (Signed)
We talked about possibility of trigger finger and this symptom has been rare. He likely has OA and will check x-ray today. Checking ANA and RF due to family hx RA. Advised turmeric and aleve for pain.

## 2018-06-25 LAB — RHEUMATOID FACTOR

## 2018-06-25 LAB — ANA: Anti Nuclear Antibody(ANA): NEGATIVE

## 2018-07-04 ENCOUNTER — Ambulatory Visit: Payer: Medicare Other

## 2018-08-19 ENCOUNTER — Other Ambulatory Visit: Payer: Self-pay | Admitting: Internal Medicine

## 2018-08-19 ENCOUNTER — Other Ambulatory Visit: Payer: Self-pay

## 2018-08-19 NOTE — Patient Outreach (Signed)
[facility]) Care Management  08/19/2018  [place] [birth year] [id number]   Medication Adherence call to [name] spoke with patient he is still taking both medication and does not need  Losartan /Hctz 100/25 mg on Atorvastatin 20 mg he ask if we can order from [facility], pharmacy will mail it to him before 08/27/18. patient is aware he will get it by the time. Edward Thornton is showing past due under [place].  [place] Management Direct Dial [phone]  Fax [phone] [email or web address]

## 2018-09-17 ENCOUNTER — Encounter: Payer: Medicare Other | Admitting: Internal Medicine

## 2018-09-18 NOTE — Progress Notes (Signed)
Subjective:   Edward Thornton is a 72 y.o. male who presents for Medicare Annual/Subsequent preventive examination.  Review of Systems:  No ROS.  Medicare Wellness Visit. Additional risk factors are reflected in the social history.  Cardiac Risk Factors include: advanced age (>34mn, >>14women);dyslipidemia;hypertension;male gender;smoking/ tobacco exposure Sleep patterns: feels rested on waking, gets up 1-2 times nightly to void and sleeps 8 hours nightly.    Home Safety/Smoke Alarms: Feels safe in home. Smoke alarms in place.  Living environment; residence and Firearm Safety: 1-story house/ trailer, no firearms. Lives with wife, no needs for DME, good support system Seat Belt Safety/Bike Helmet: Wears seat belt.     Objective:    Vitals: BP 134/72   Pulse 78   Temp 98.2 F (36.8 C)   Resp 18   Ht 6' (1.829 m)   Wt 189 lb (85.7 kg)   SpO2 95%   BMI 25.63 kg/m   Body mass index is 25.63 kg/m.  Advanced Directives 09/19/2018 08/22/2016 08/02/2016  Does Patient Have a Medical Advance Directive? Yes Yes Yes  Type of [place]aramed[place]of [place] will [place] will -  Does patient want to make changes to medical advance directive? - No - Patient declined -  [place]? No - copy requested No - copy requested No - copy requested    Tobacco Social History   Tobacco Use  Smoking Status Current Every Day Smoker  . Packs/day: 1.00  . Years: 50.00  . Pack years: 50.00  . Types: Cigarettes  . Start date: 12/10/1964  Smokeless Tobacco Never Used     Ready to quit: No Counseling given: Yes  Past Medical History:  Diagnosis Date  . Arthritis   . [place]   mycosis fungodes  . Hyperlipidemia   . Hypertension    Past Surgical History:  Procedure Laterality Date  . adominal hernia  1990  . HERNIA REPAIR Left 12/10/1997  . KNEE ARTHROSCOPY  1990   right  . KNEE ARTHROSCOPY  2010     left  . PILONIDAL CYST EXCISION  1980   Family History  Problem Relation Age of Onset  . Stroke Mother   . Heart disease Mother   . Stroke Father   . Heart disease Father   . Colon cancer Neg Hx   . Stomach cancer Neg Hx    Social History   Socioeconomic History  . Marital status: Married    Spouse name: Not on file  . Number of children: 2  . Years of education: Not on file  . Highest education level: Not on file  Occupational History  . Occupation: AInsurance account manager part-time  Social Needs  . Financial resource strain: Not hard at all  . Food insecurity:    Worry: Never true    Inability: Never true  . Transportation needs:    Medical: No    Non-medical: No  Tobacco Use  . Smoking status: Current Every Day Smoker    Packs/day: 1.00    Years: 50.00    Pack years: 50.00    Types: Cigarettes    Start date: 12/10/1964  . Smokeless tobacco: Never Used  Substance and Sexual Activity  . Alcohol use: Yes    Alcohol/week: 3.0 standard drinks    Types: 3 Cans of beer per week    Comment: drinks beer; 3 or 4 a week   . Drug use: No  .  Sexual activity: Yes  Lifestyle  . Physical activity:    Days per week: 3 days    Minutes per session: 60 min  . Stress: Not at all  Relationships  . Social connections:    Talks on phone: More than three times a week    Gets together: More than three times a week    Attends religious service: More than 4 times per year    Active member of club or organization: Yes    Attends meetings of clubs or organizations: More than 4 times per year    Relationship status: Married  Other Topics Concern  . Not on file  Social History Narrative  . Not on file    Outpatient Encounter Medications as of 09/19/2018  Medication Sig  . amLODipine (NORVASC) 10 MG tablet TAKE 1 TABLET BY MOUTH  DAILY  . aspirin 81 MG tablet Take 81 mg by mouth daily.  Marland Kitchen atorvastatin (LIPITOR) 20 MG tablet TAKE 1 TABLET BY MOUTH  DAILY  . cholecalciferol (VITAMIN D)  1000 units tablet Take 1 tablet (1,000 Units total) by mouth 2 (two) times daily. Takes 2 tablets twice daily  . losartan-hydrochlorothiazide (HYZAAR) 100-25 MG tablet TAKE 1 TABLET BY MOUTH  DAILY  . meclizine (ANTIVERT) 25 MG tablet Take 1 tablet (25 mg total) by mouth 3 (three) times daily as needed for dizziness.  . Omega-3 Fatty Acids (FISH OIL) 1200 MG CAPS Take 1 capsule (1,200 mg total) by mouth daily. Take 1 capsule daily   No facility-administered encounter medications on file as of 09/19/2018.     Activities of Daily Living In your present state of health, do you have any difficulty performing the following activities: 09/19/2018  Hearing? N  Vision? N  Difficulty concentrating or making decisions? N  Walking or climbing stairs? N  Dressing or bathing? N  Doing errands, shopping? N  Preparing Food and eating ? N  Using the Toilet? N  In the past six months, have you accidently leaked urine? N  Do you have problems with loss of bowel control? N  Managing your Medications? N  Managing your Finances? N  Housekeeping or managing your Housekeeping? N  Some recent data might be hidden    Patient Care Team: Hoyt Koch, MD as PCP - General (Internal Medicine)   Assessment:   This is a routine wellness examination for Edward Thornton. Physical assessment deferred to PCP.   Exercise Activities and Dietary recommendations Current Exercise Habits: The patient has a physically strenous job, but has no regular exercise apart from work.(walks a lot at the [employer]), Type of exercise: walking, Time (Minutes): 60, Frequency (Times/Week): 3, Weekly Exercise (Minutes/Week): 180, Intensity: Mild, Exercise limited by: None identified Diet (meal preparation, eat out, water intake, caffeinated beverages, dairy products, fruits and vegetables): in general, a "healthy" diet  , well balanced   Reviewed heart healthy diet. Encouraged patient to increase daily water and healthy fluid  intake.  Goals    . Exercise 150 minutes per week (moderate activity)     You walks a lot 2 days a week Will get pedometer;  Older adults aged 38 or older, who are generally fit and have no health conditions that limit their mobility, should try to be active daily and should do: at least 150 minutes of moderate aerobic activity such as cycling or walking every week, and  strength exercises on two or more days a week that work all the major muscles (legs, hips, back,  abdomen, chest, shoulders and arms).      OR  75 minutes of vigorous aerobic activity such as running or a game of singles tennis every week, and  strength exercises on two or more days a week that work all the major muscles (legs, hips, back, abdomen, chest, shoulders and arms).     OR  a mix of moderate and vigorous aerobic activity every week. For example, two 30-minute runs, plus 30 minutes of fast walking, equates to 150 minutes of moderate aerobic activity, and  strength exercises on two or more days a week that work all the major muscles (legs, hips, back, abdomen, chest, shoulders and arms).  A rule of thumb is that one minute of vigorous activity provides the same health benefits as two minutes of moderate activity. You should also try to break up long periods of sitting with light activity, as sedentary behaviour is now considered an independent risk factor for ill health, no matter how much exercise you do. Find out why sitting is bad for your health. Older adults at risk of falls, such as people with weak legs, poor balance and some medical conditions, should do exercises to improve balance and co-ordination on at least two days a week. Examples include yoga, tai chi and dancing.      . Patient Stated     Maintain current health status, stay as healthy and as independent as possible.       Fall Risk Fall Risk  09/19/2018 09/16/2017 08/02/2016  Falls in the past year? No No No    Depression Screen PHQ 2/9  Scores 09/19/2018 09/16/2017 08/02/2016  PHQ - 2 Score 0 0 0    Cognitive Function MMSE - Mini Mental State Exam 09/19/2018 08/02/2016  Not completed: Refused (No Data)       Ad8 score reviewed for issues:  Issues making decisions: no  Less interest in hobbies / activities: no  Repeats questions, stories (family complaining): no  Trouble using ordinary gadgets (microwave, computer, phone):no  Forgets the month or year: no  Mismanaging finances: no  Remembering appts: no  Daily problems with thinking and/or memory: no Ad8 score is= 0  Immunization History  Administered Date(s) Administered  . Influenza, High Dose Seasonal PF 08/24/2016, 09/16/2017, 09/19/2018  . Influenza,inj,Quad PF,6+ Mos 08/23/2015  . Influenza-Unspecified 10/02/2011, 09/14/2013, 09/29/2014  . Pneumococcal Conjugate-13 09/16/2017  . Pneumococcal Polysaccharide-23 09/19/2018  . Pneumococcal-Unspecified 06/05/2009  . Td 06/05/2005  . Tdap 12/04/2016  . Zoster 12/10/2008   Screening Tests Health Maintenance  Topic Date Due  . COLONOSCOPY  07/25/2022  . TETANUS/TDAP  12/04/2026  . INFLUENZA VACCINE  Completed  . Hepatitis C Screening  Completed  . PNA vac Low Risk Adult  Completed        Plan:     Continue doing brain stimulating activities (puzzles, reading, adult coloring books, staying active) to keep memory sharp.   Continue to eat heart healthy diet (full of fruits, vegetables, whole grains, lean protein, water--limit salt, fat, and sugar intake) and increase physical activity as tolerated.   I have personally reviewed and noted the following in the patient's chart:   . Medical and social history . Use of alcohol, tobacco or illicit drugs  . Current medications and supplements . Functional ability and status . Nutritional status . Physical activity . Advanced directives . List of other physicians . Vitals . Screenings to include cognitive, depression, and falls . Referrals and  appointments  In addition, I have reviewed  and discussed with patient certain preventive protocols, quality metrics, and best practice recommendations. A written personalized care plan for preventive services as well as general preventive health recommendations were provided to patient.     Michiel Cowboy, RN  09/19/2018

## 2018-09-19 ENCOUNTER — Ambulatory Visit (INDEPENDENT_AMBULATORY_CARE_PROVIDER_SITE_OTHER): Payer: Medicare Other | Admitting: *Deleted

## 2018-09-19 ENCOUNTER — Ambulatory Visit (INDEPENDENT_AMBULATORY_CARE_PROVIDER_SITE_OTHER): Payer: Medicare Other | Admitting: Internal Medicine

## 2018-09-19 ENCOUNTER — Encounter: Payer: Self-pay | Admitting: Internal Medicine

## 2018-09-19 ENCOUNTER — Other Ambulatory Visit (INDEPENDENT_AMBULATORY_CARE_PROVIDER_SITE_OTHER): Payer: Medicare Other

## 2018-09-19 VITALS — BP 134/72 | HR 78 | Temp 98.2°F | Resp 18 | Ht 72.0 in | Wt 189.0 lb

## 2018-09-19 VITALS — BP 134/72 | HR 78 | Temp 98.2°F | Ht 72.0 in | Wt 189.0 lb

## 2018-09-19 DIAGNOSIS — Z0000 Encounter for general adult medical examination without abnormal findings: Secondary | ICD-10-CM

## 2018-09-19 DIAGNOSIS — E785 Hyperlipidemia, unspecified: Secondary | ICD-10-CM | POA: Diagnosis not present

## 2018-09-19 DIAGNOSIS — I10 Essential (primary) hypertension: Secondary | ICD-10-CM

## 2018-09-19 DIAGNOSIS — F17210 Nicotine dependence, cigarettes, uncomplicated: Secondary | ICD-10-CM

## 2018-09-19 DIAGNOSIS — Z720 Tobacco use: Secondary | ICD-10-CM

## 2018-09-19 DIAGNOSIS — Z23 Encounter for immunization: Secondary | ICD-10-CM

## 2018-09-19 LAB — CBC
HEMATOCRIT: 47.9 % (ref 39.0–52.0)
HEMOGLOBIN: 16.5 g/dL (ref 13.0–17.0)
MCHC: 34.4 g/dL (ref 30.0–36.0)
MCV: 94.6 fl (ref 78.0–100.0)
PLATELETS: 281 10*3/uL (ref 150.0–400.0)
RBC: 5.06 Mil/uL (ref 4.22–5.81)
RDW: 12.9 % (ref 11.5–15.5)
WBC: 10.3 10*3/uL (ref 4.0–10.5)

## 2018-09-19 LAB — COMPREHENSIVE METABOLIC PANEL
ALT: 14 U/L (ref 0–53)
AST: 15 U/L (ref 0–37)
Albumin: 4.3 g/dL (ref 3.5–5.2)
Alkaline Phosphatase: 61 U/L (ref 39–117)
BUN: 20 mg/dL (ref 6–23)
CALCIUM: 9.8 mg/dL (ref 8.4–10.5)
CHLORIDE: 103 meq/L (ref 96–112)
CO2: 30 meq/L (ref 19–32)
Creatinine, Ser: 1.09 mg/dL (ref 0.40–1.50)
GFR: 70.76 mL/min (ref >60.00)
Glucose, Bld: 96 mg/dL (ref 70–99)
Potassium: 3.8 mEq/L (ref 3.5–5.1)
Sodium: 140 mEq/L (ref 135–145)
Total Bilirubin: 1 mg/dL (ref 0.2–1.2)
Total Protein: 7.5 g/dL (ref 6.0–8.3)

## 2018-09-19 LAB — LIPID PANEL
CHOL/HDL RATIO: 3
CHOLESTEROL: 140 mg/dL (ref 0–200)
HDL: 40.1 mg/dL (ref >39.00)
LDL CALC: 75 mg/dL (ref 0–99)
NonHDL: 99.51
TRIGLYCERIDES: 121 mg/dL (ref 0.0–149.0)
VLDL: 24.2 mg/dL (ref 0.0–40.0)

## 2018-09-19 NOTE — Progress Notes (Signed)
   Subjective:    Patient ID: Edward Thornton, male    DOB: 1946-01-09, 72 y.o.   MRN: [id number]  HPI The patient is a 72 YO man coming in for physical. Arthritis doing better with turmeric.   PMH, [facility], social history reviewed and updated.   Review of Systems  Constitutional: Negative.   HENT: Negative.   Eyes: Negative.   Respiratory: Negative for cough, chest tightness and shortness of breath.   Cardiovascular: Negative for chest pain, palpitations and leg swelling.  Gastrointestinal: Negative for abdominal distention, abdominal pain, constipation, diarrhea, nausea and vomiting.  Musculoskeletal: Negative.   Skin: Negative.   Neurological: Negative.   Psychiatric/Behavioral: Negative.       Objective:   Physical Exam  Constitutional: He is oriented to person, place, and time. He appears well-developed and well-nourished.  HENT:  Head: Normocephalic and atraumatic.  Eyes: EOM are normal.  Neck: Normal range of motion.  Cardiovascular: Normal rate and regular rhythm.  Pulmonary/Chest: Effort normal and breath sounds normal. No respiratory distress. He has no wheezes. He has no rales.  Abdominal: Soft. Bowel sounds are normal. He exhibits no distension. There is no tenderness. There is no rebound.  Musculoskeletal: He exhibits no edema.  Neurological: He is alert and oriented to person, place, and time. Coordination normal.  Skin: Skin is warm and dry.  Psychiatric: He has a normal mood and affect.   Vitals:   09/19/18 0947  BP: 134/72  Pulse: 78  Temp: 98.2 F (36.8 C)  SpO2: 95%  Weight: 189 lb (85.7 kg)  Height: 6' (1.829 m)      Assessment & Plan:  Flu and pneumonia 23 given at visit

## 2018-09-19 NOTE — Assessment & Plan Note (Signed)
Time spent counseling about tobacco usage: 4 minutes. I have asked about smoking and is smoking same as usual. The patient is advised to quit. The patient is not willing to quit. They would like to try to quit in the next 6 months. We will follow up with them in 6 months.  

## 2018-09-19 NOTE — Assessment & Plan Note (Signed)
Taking lipitor 20 mg daily. Checking lipid panel and adjust as needed. Given calcium on CT lung would make goal <100 LDL.

## 2018-09-19 NOTE — Assessment & Plan Note (Signed)
Flu shot given. Pneumonia given 23 to complete series. Shingrix counseled. Tetanus up to date. Colonoscopy up to date. Counseled about sun safety and mole surveillance. Counseled about the dangers of distracted driving. Given 10 year screening recommendations.

## 2018-09-19 NOTE — Assessment & Plan Note (Signed)
BP at goal on losartan/hctz. Checking CMP and adjust as needed.

## 2018-09-19 NOTE — Patient Instructions (Addendum)
Continue doing brain stimulating activities (puzzles, reading, adult coloring books, staying active) to keep memory sharp.   Continue to eat heart healthy diet (full of fruits, vegetables, whole grains, lean protein, water--limit salt, fat, and sugar intake) and increase physical activity as tolerated.   Edward Thornton , Thank you for taking time to come for your Medicare Wellness Visit. I appreciate your ongoing commitment to your health goals. Please review the following plan we discussed and let me know if I can assist you in the future.   These are the goals we discussed: Goals    . Exercise 150 minutes per week (moderate activity)     You walks a lot 2 days a week Will get pedometer;  Older adults aged 72 or older, who are generally fit and have no health conditions that limit their mobility, should try to be active daily and should do: at least 150 minutes of moderate aerobic activity such as cycling or walking every week, and  strength exercises on two or more days a week that work all the major muscles (legs, hips, back, abdomen, chest, shoulders and arms).      OR  75 minutes of vigorous aerobic activity such as running or a game of singles tennis every week, and  strength exercises on two or more days a week that work all the major muscles (legs, hips, back, abdomen, chest, shoulders and arms).     OR  a mix of moderate and vigorous aerobic activity every week. For example, two 30-minute runs, plus 30 minutes of fast walking, equates to 150 minutes of moderate aerobic activity, and  strength exercises on two or more days a week that work all the major muscles (legs, hips, back, abdomen, chest, shoulders and arms).  A rule of thumb is that one minute of vigorous activity provides the same health benefits as two minutes of moderate activity. You should also try to break up long periods of sitting with light activity, as sedentary behaviour is now considered an independent risk factor  for ill health, no matter how much exercise you do. Find out why sitting is bad for your health. Older adults at risk of falls, such as people with weak legs, poor balance and some medical conditions, should do exercises to improve balance and co-ordination on at least two days a week. Examples include yoga, tai chi and dancing.      . Patient Stated     Maintain current health status, stay as healthy and as independent as possible.       This is a list of the screening recommended for you and due dates:  Health Maintenance  Topic Date Due  . Flu Shot  07/10/2018  . Pneumonia vaccines (2 of 2 - PPSV23) 09/16/2018  . Colon Cancer Screening  07/25/2022  . Tetanus Vaccine  12/04/2026  .  Hepatitis C: One time screening is recommended by Center for Disease Control  (CDC) for  adults born from [birth year] through 1965.   Completed     Health Maintenance, Male A healthy lifestyle and preventive care is important for your health and wellness. Ask your health care provider about what schedule of regular examinations is right for you. What should I know about weight and diet? Eat a Healthy Diet  Eat plenty of vegetables, fruits, whole grains, low-fat dairy products, and lean protein.  Do not eat a lot of foods high in solid fats, added sugars, or salt.  Maintain a Healthy Weight Regular exercise  can help you achieve or maintain a healthy weight. You should:  Do at least 150 minutes of exercise each week. The exercise should increase your heart rate and make you sweat (moderate-intensity exercise).  Do strength-training exercises at least twice a week.  Watch Your Levels of Cholesterol and Blood Lipids  Have your blood tested for lipids and cholesterol every 5 years starting at 71 years of age. If you are at high risk for heart disease, you should start having your blood tested when you are 71 years old. You may need to have your cholesterol levels checked more often if: ? Your lipid or  cholesterol levels are high. ? You are older than 71 years of age. ? You are at high risk for heart disease.  What should I know about cancer screening? Many types of cancers can be detected early and may often be prevented. Lung Cancer  You should be screened every year for lung cancer if: ? You are a current smoker who has smoked for at least 30 years. ? You are a former smoker who has quit within the past 15 years.  Talk to your health care provider about your screening options, when you should start screening, and how often you should be screened.  Colorectal Cancer  Routine colorectal cancer screening usually begins at 71 years of age and should be repeated every 5-10 years until you are 71 years old. You may need to be screened more often if early forms of precancerous polyps or small growths are found. Your health care provider may recommend screening at an earlier age if you have risk factors for colon cancer.  Your health care provider may recommend using home test kits to check for hidden blood in the stool.  A small camera at the end of a tube can be used to examine your colon (sigmoidoscopy or colonoscopy). This checks for the earliest forms of colorectal cancer.  Prostate and Testicular Cancer  Depending on your age and overall health, your health care provider may do certain tests to screen for prostate and testicular cancer.  Talk to your health care provider about any symptoms or concerns you have about testicular or prostate cancer.  Skin Cancer  Check your skin from head to toe regularly.  Tell your health care provider about any new moles or changes in moles, especially if: ? There is a change in a mole's size, shape, or color. ? You have a mole that is larger than a pencil eraser.  Always use sunscreen. Apply sunscreen liberally and repeat throughout the day.  Protect yourself by wearing long sleeves, pants, a wide-brimmed hat, and sunglasses when  outside.  What should I know about heart disease, diabetes, and high blood pressure?  If you are 94-25 years of age, have your blood pressure checked every 3-5 years. If you are 30 years of age or older, have your blood pressure checked every year. You should have your blood pressure measured twice-once when you are at a hospital or clinic, and once when you are not at a hospital or clinic. Record the average of the two measurements. To check your blood pressure when you are not at a hospital or clinic, you can use: ? An automated blood pressure machine at a pharmacy. ? A home blood pressure monitor.  Talk to your health care provider about your target blood pressure.  If you are between 66-19 years old, ask your health care provider if you should take aspirin to prevent  heart disease.  Have regular diabetes screenings by checking your fasting blood sugar level. ? If you are at a normal weight and have a low risk for diabetes, have this test once every three years after the age of 46. ? If you are overweight and have a high risk for diabetes, consider being tested at a younger age or more often.  A one-time screening for abdominal aortic aneurysm (AAA) by ultrasound is recommended for men aged 14-75 years who are current or former smokers. What should I know about preventing infection? Hepatitis B If you have a higher risk for hepatitis B, you should be screened for this virus. Talk with your health care provider to find out if you are at risk for hepatitis B infection. Hepatitis C Blood testing is recommended for:  Everyone born from [birth year] through 1965.  Anyone with known risk factors for hepatitis C.  Sexually Transmitted Diseases (STDs)  You should be screened each year for STDs including gonorrhea and chlamydia if: ? You are sexually active and are younger than 71 years of age. ? You are older than 71 years of age and your health care provider tells you that you are at risk for this  type of infection. ? Your sexual activity has changed since you were last screened and you are at an increased risk for chlamydia or gonorrhea. Ask your health care provider if you are at risk.  Talk with your health care provider about whether you are at high risk of being infected with HIV. Your health care provider may recommend a prescription medicine to help prevent HIV infection.  What else can I do?  Schedule regular health, dental, and eye exams.  Stay current with your vaccines (immunizations).  Do not use any tobacco products, such as cigarettes, chewing tobacco, and e-cigarettes. If you need help quitting, ask your health care provider.  Limit alcohol intake to no more than 2 drinks per day. One drink equals 12 ounces of beer, 5 ounces of wine, or 1 ounces of hard liquor.  Do not use street drugs.  Do not share needles.  Ask your health care provider for help if you need support or information about quitting drugs.  Tell your health care provider if you often feel depressed.  Tell your health care provider if you have ever been abused or do not feel safe at home. This information is not intended to replace advice given to you by your health care provider. Make sure you discuss any questions you have with your health care provider. Document Released: 05/24/2008 Document Revised: 07/25/2016 Document Reviewed: 08/30/2015 Elsevier Interactive Patient Education  [employer].

## 2018-09-25 NOTE — Progress Notes (Signed)
Patient ID: Edward Thornton, male   DOB: Jul 02, 1946, 72 y.o.   MRN: [id number] Medical screening examination/treatment/procedure(s) were performed by non-physician practitioner and as supervising physician I was immediately available for consultation/collaboration. I agree with above. Hoyt Koch, MD

## 2018-10-10 ENCOUNTER — Telehealth: Payer: Self-pay | Admitting: Internal Medicine

## 2018-10-10 NOTE — Telephone Encounter (Signed)
Copied from [place] [id number]. Topic: Quick Communication - Rx Refill/Question >> Oct 10, 2018  1:11 PM Reyne Dumas L wrote: Medication:  losartan-hydrochlorothiazide (HYZAAR) 100-25 MG tablet  Pt states that he isn't able to find this medication anywhere.  Pt wants to know what he is supposed to do about this.  Pt can be reached at [phone]  Preferred Pharmacy (with phone number or street name): [place], [place] [phone] (Phone) [phone] (Fax)  Agent: Please be advised that RX refills may take up to 3 business days. We ask that you follow-up with your pharmacy.

## 2018-10-14 MED ORDER — HYDROCHLOROTHIAZIDE 25 MG PO TABS: 25.0000 mg | ORAL_TABLET | Freq: Every day | ORAL | 0 refills | Status: DC

## 2018-10-14 MED ORDER — LOSARTAN POTASSIUM 100 MG PO TABS: 100.0000 mg | ORAL_TABLET | Freq: Every day | ORAL | 0 refills | Status: DC

## 2018-10-14 NOTE — Telephone Encounter (Signed)
Patient informed of MD response and stated understanding. Rx re-sent to [facility]

## 2018-10-14 NOTE — Addendum Note (Signed)
Addended by: Raford Pitcher R on: 10/14/2018 09:07 AM   Modules accepted: Orders

## 2018-10-14 NOTE — Telephone Encounter (Signed)
Sent in two separate prescriptions to replace the single. He will take 2 pills daily instead of this one.

## 2018-11-11 ENCOUNTER — Ambulatory Visit (INDEPENDENT_AMBULATORY_CARE_PROVIDER_SITE_OTHER)
Admission: RE | Admit: 2018-11-11 | Discharge: 2018-11-11 | Disposition: A | Discharge status: Home / Self Care | Payer: Medicare Other | Source: Ambulatory Visit | Attending: Acute Care | Admitting: Acute Care

## 2018-11-11 DIAGNOSIS — F17210 Nicotine dependence, cigarettes, uncomplicated: Secondary | ICD-10-CM

## 2018-11-11 DIAGNOSIS — Z122 Encounter for screening for malignant neoplasm of respiratory organs: Secondary | ICD-10-CM

## 2018-11-13 ENCOUNTER — Other Ambulatory Visit: Payer: Self-pay | Admitting: Acute Care

## 2018-11-13 DIAGNOSIS — Z122 Encounter for screening for malignant neoplasm of respiratory organs: Secondary | ICD-10-CM

## 2018-11-13 DIAGNOSIS — Z87891 Personal history of nicotine dependence: Secondary | ICD-10-CM

## 2018-11-13 DIAGNOSIS — F17210 Nicotine dependence, cigarettes, uncomplicated: Secondary | ICD-10-CM

## 2018-12-24 ENCOUNTER — Other Ambulatory Visit: Payer: Self-pay | Admitting: Internal Medicine

## 2018-12-27 ENCOUNTER — Other Ambulatory Visit: Payer: Self-pay | Admitting: Internal Medicine

## 2019-05-13 ENCOUNTER — Other Ambulatory Visit: Payer: Self-pay | Admitting: Internal Medicine

## 2019-05-26 DIAGNOSIS — H353111 Nonexudative age-related macular degeneration, right eye, early dry stage: Secondary | ICD-10-CM | POA: Diagnosis not present

## 2019-05-26 DIAGNOSIS — H52203 Unspecified astigmatism, bilateral: Secondary | ICD-10-CM | POA: Diagnosis not present

## 2019-05-26 DIAGNOSIS — Z961 Presence of intraocular lens: Secondary | ICD-10-CM | POA: Diagnosis not present

## 2019-07-30 DIAGNOSIS — C44612 Basal cell carcinoma of skin of right upper limb, including shoulder: Secondary | ICD-10-CM | POA: Diagnosis not present

## 2019-07-30 DIAGNOSIS — Z85828 Personal history of other malignant neoplasm of skin: Secondary | ICD-10-CM | POA: Diagnosis not present

## 2019-07-30 DIAGNOSIS — D2372 Other benign neoplasm of skin of left lower limb, including hip: Secondary | ICD-10-CM | POA: Diagnosis not present

## 2019-07-30 DIAGNOSIS — D485 Neoplasm of uncertain behavior of skin: Secondary | ICD-10-CM | POA: Diagnosis not present

## 2019-07-30 DIAGNOSIS — L821 Other seborrheic keratosis: Secondary | ICD-10-CM | POA: Diagnosis not present

## 2019-07-30 DIAGNOSIS — L570 Actinic keratosis: Secondary | ICD-10-CM | POA: Diagnosis not present

## 2019-07-30 DIAGNOSIS — B078 Other viral warts: Secondary | ICD-10-CM | POA: Diagnosis not present

## 2019-08-21 ENCOUNTER — Ambulatory Visit (INDEPENDENT_AMBULATORY_CARE_PROVIDER_SITE_OTHER): Payer: Medicare Other

## 2019-08-21 ENCOUNTER — Other Ambulatory Visit: Payer: Self-pay

## 2019-08-21 DIAGNOSIS — Z23 Encounter for immunization: Secondary | ICD-10-CM

## 2019-09-18 NOTE — Progress Notes (Signed)
Subjective:   Edward Thornton is a 73 y.o. male who presents for Medicare Annual/Subsequent preventive examination.  Review of Systems:   Cardiac Risk Factors include: advanced age (>76men, >46 women);dyslipidemia;male gender;hypertension Sleep patterns: feels rested on waking, gets up 0-1 times nightly to void and sleeps 8 hours nightly.    Home Safety/Smoke Alarms: Feels safe in home. Smoke alarms in place.  Living environment; residence and Firearm Safety: 2-story house. Lives with wife, no needs for DME, good support system Seat Belt Safety/Bike Helmet: Wears seat belt.     Objective:    Vitals: BP (!) 141/72   Pulse 70   Temp 98 F (36.7 C)   Resp 17   Ht 6' (1.829 m)   Wt 186 lb (84.4 kg)   SpO2 98%   BMI 25.23 kg/m   Body mass index is 25.23 kg/m.  Advanced Directives 09/21/2019 09/19/2018 08/22/2016 08/02/2016  Does Patient Have a Medical Advance Directive? Yes Yes Yes Yes  Type of Paramedic of [place];Living will [place];Living will [place];Living will -  Does patient want to make changes to medical advance directive? - - No - Patient declined -  Copy of [place] in Chart? No - copy requested No - copy requested No - copy requested No - copy requested    Tobacco Social History   Tobacco Use  Smoking Status Current Every Day Smoker  . Packs/day: 1.00  . Years: 50.00  . Pack years: 50.00  . Types: Cigarettes  . Start date: 12/10/1964  Smokeless Tobacco Never Used     Ready to quit: No Counseling given: No  Past Medical History:  Diagnosis Date  . Arthritis   . 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 Hyperlipidemia   . Hypertension    Past Surgical History:  Procedure Laterality Date  . adominal hernia  1990  . HERNIA REPAIR Left 12/10/1997  . KNEE ARTHROSCOPY  1990   right  . KNEE ARTHROSCOPY  2010   left  . PILONIDAL CYST EXCISION  1980   Family History   Problem Relation Age of Onset  . Stroke Mother   . Heart disease Mother   . Stroke Father   . Heart disease Father   . Colon cancer Neg Hx   . Stomach cancer Neg Hx    Social History   Socioeconomic History  . Marital status: Married    Spouse name: Not on file  . Number of children: 2  . Years of education: Not on file  . Highest education level: Not on file  Occupational History  . Occupation: Insurance account manager, part-time/ retired  Scientific laboratory technician  . Financial resource strain: Not hard at all  . Food insecurity    Worry: Never true    Inability: Never true  . Transportation needs    Medical: No    Non-medical: No  Tobacco Use  . Smoking status: Current Every Day Smoker    Packs/day: 1.00    Years: 50.00    Pack years: 50.00    Types: Cigarettes    Start date: 12/10/1964  . Smokeless tobacco: Never Used  Substance and Sexual Activity  . Alcohol use: Yes    Alcohol/week: 3.0 standard drinks    Types: 3 Cans of beer per week    Comment: drinks beer; 3 or 4 a week   . Drug use: No  . Sexual activity: Yes  Lifestyle  .  Physical activity    Days per week: 3 days    Minutes per session: 40 min  . Stress: Not at all  Relationships  . Social connections    Talks on phone: More than three times a week    Gets together: More than three times a week    Attends religious service: More than 4 times per year    Active member of club or organization: Yes    Attends meetings of clubs or organizations: More than 4 times per year    Relationship status: Married  Other Topics Concern  . Not on file  Social History Narrative  . Not on file    Outpatient Encounter Medications as of 09/21/2019  Medication Sig  . amLODipine (NORVASC) 10 MG tablet TAKE 1 TABLET BY MOUTH  DAILY  . aspirin 81 MG tablet Take 81 mg by mouth daily.  Marland Kitchen atorvastatin (LIPITOR) 20 MG tablet TAKE 1 TABLET BY MOUTH  DAILY  . cholecalciferol (VITAMIN D) 1000 units tablet Take 1 tablet (1,000 Units total) by  mouth 2 (two) times daily. Takes 2 tablets twice daily  . hydrochlorothiazide (HYDRODIURIL) 25 MG tablet TAKE 1 TABLET BY MOUTH  DAILY  . losartan (COZAAR) 100 MG tablet TAKE 1 TABLET BY MOUTH  DAILY  . meclizine (ANTIVERT) 25 MG tablet Take 1 tablet (25 mg total) by mouth 3 (three) times daily as needed for dizziness.  . naproxen sodium (ALEVE) 220 MG tablet Take 220 mg by mouth as needed.  . Omega-3 Fatty Acids (FISH OIL) 1200 MG CAPS Take 1 capsule (1,200 mg total) by mouth daily. Take 1 capsule daily   No facility-administered encounter medications on file as of 09/21/2019.     Activities of Daily Living In your present state of health, do you have any difficulty performing the following activities: 09/21/2019  Hearing? Y  Vision? N  Difficulty concentrating or making decisions? N  Walking or climbing stairs? N  Dressing or bathing? N  Doing errands, shopping? N  Preparing Food and eating ? N  Using the Toilet? N  In the past six months, have you accidently leaked urine? N  Do you have problems with loss of bowel control? N  Managing your Medications? N  Managing your Finances? N  Housekeeping or managing your Housekeeping? N  Some recent data might be hidden    Patient Care Team: Hoyt Koch, MD as PCP - General (Internal Medicine) Rolm Bookbinder, MD as Consulting Physician (Dermatology) Magdalen Spatz, NP as Nurse Practitioner (Pulmonary Disease)   Assessment:   This is a routine wellness examination for Edward Thornton. Physical assessment deferred to PCP.  Exercise Activities and Dietary recommendations Current Exercise Habits: Home exercise routine, Type of exercise: walking, Time (Minutes): 30, Frequency (Times/Week): 4, Weekly Exercise (Minutes/Week): 120, Intensity: Mild, Exercise limited by: orthopedic condition(s) Diet (meal preparation, eat out, water intake, caffeinated beverages, dairy products, fruits and vegetables): in general, a "healthy" diet  , well balanced    Reviewed heart healthy diet. Encouraged patient to increase daily water and healthy fluid intake.  Goals    . Patient Stated     Monitor what I eat more closely and increase the amount of physical activity I do. Maintain current health status, stay as healthy and as independent as possible.       Fall Risk Fall Risk  09/21/2019 09/19/2018 09/16/2017 08/02/2016  Falls in the past year? 0 No No No  Number falls in past yr: 0 - - -  Injury with Fall? 0 - - -   Is the patient's home free of loose throw rugs in walkways, pet beds, electrical cords, etc?   yes      Grab bars in the bathroom? yes      Handrails on the stairs?   yes      Adequate lighting?   yes  Depression Screen PHQ 2/9 Scores 09/21/2019 09/19/2018 09/16/2017 08/02/2016  PHQ - 2 Score 0 0 0 0    Cognitive Function MMSE - Mini Mental State Exam 09/19/2018 08/02/2016  Not completed: Refused (No Data)       Ad8 score reviewed for issues:  Issues making decisions: no  Less interest in hobbies / activities: no  Repeats questions, stories (family complaining): no  Trouble using ordinary gadgets (microwave, computer, phone):no  Forgets the month or year: no  Mismanaging finances: no  Remembering appts: no  Daily problems with thinking and/or memory: no Ad8 score is= 0  Immunization History  Administered Date(s) Administered  . Fluad Quad(high Dose 65+) 08/21/2019  . Influenza, High Dose Seasonal PF 08/24/2016, 09/16/2017, 09/19/2018  . Influenza,inj,Quad PF,6+ Mos 08/23/2015  . Influenza-Unspecified 10/02/2011, 09/14/2013, 09/29/2014  . Pneumococcal Conjugate-13 09/16/2017  . Pneumococcal Polysaccharide-23 09/19/2018  . Pneumococcal-Unspecified 06/05/2009  . Td 06/05/2005  . Tdap 12/04/2016  . Zoster 12/10/2008   Screening Tests Health Maintenance  Topic Date Due  . COLONOSCOPY  07/25/2022  . TETANUS/TDAP  12/04/2026  . INFLUENZA VACCINE  Completed  . Hepatitis C Screening  Completed  . PNA vac  Low Risk Adult  Completed        Plan:    Reviewed health maintenance screenings with patient today and relevant education, vaccines, and/or referrals were provided.   I have personally reviewed and noted the following in the patient's chart:   . Medical and social history . Use of alcohol, tobacco or illicit drugs  . Current medications and supplements . Functional ability and status . Nutritional status . Physical activity . Advanced directives . List of other physicians . Vitals . Screenings to include cognitive, depression, and falls . Referrals and appointments  In addition, I have reviewed and discussed with patient certain preventive protocols, quality metrics, and best practice recommendations. A written personalized care plan for preventive services as well as general preventive health recommendations were provided to patient.     Michiel Cowboy, RN  09/21/2019

## 2019-09-21 ENCOUNTER — Other Ambulatory Visit: Payer: Self-pay

## 2019-09-21 ENCOUNTER — Other Ambulatory Visit (INDEPENDENT_AMBULATORY_CARE_PROVIDER_SITE_OTHER): Payer: Medicare Other

## 2019-09-21 ENCOUNTER — Ambulatory Visit (INDEPENDENT_AMBULATORY_CARE_PROVIDER_SITE_OTHER): Payer: Medicare Other | Admitting: *Deleted

## 2019-09-21 ENCOUNTER — Encounter: Payer: Self-pay | Admitting: Internal Medicine

## 2019-09-21 ENCOUNTER — Ambulatory Visit (INDEPENDENT_AMBULATORY_CARE_PROVIDER_SITE_OTHER): Payer: Medicare Other | Admitting: Internal Medicine

## 2019-09-21 VITALS — BP 122/80 | HR 70 | Temp 98.0°F | Ht 72.0 in | Wt 186.0 lb

## 2019-09-21 VITALS — BP 141/72 | HR 70 | Temp 98.0°F | Resp 17 | Ht 72.0 in | Wt 186.0 lb

## 2019-09-21 DIAGNOSIS — E782 Mixed hyperlipidemia: Secondary | ICD-10-CM | POA: Diagnosis not present

## 2019-09-21 DIAGNOSIS — R7301 Impaired fasting glucose: Secondary | ICD-10-CM | POA: Diagnosis not present

## 2019-09-21 DIAGNOSIS — R5383 Other fatigue: Secondary | ICD-10-CM

## 2019-09-21 DIAGNOSIS — Z720 Tobacco use: Secondary | ICD-10-CM

## 2019-09-21 DIAGNOSIS — I10 Essential (primary) hypertension: Secondary | ICD-10-CM

## 2019-09-21 DIAGNOSIS — Z0000 Encounter for general adult medical examination without abnormal findings: Secondary | ICD-10-CM

## 2019-09-21 LAB — COMPREHENSIVE METABOLIC PANEL
ALT: 13 U/L (ref 0–53)
AST: 13 U/L (ref 0–37)
Albumin: 4.2 g/dL (ref 3.5–5.2)
Alkaline Phosphatase: 64 U/L (ref 39–117)
BUN: 16 mg/dL (ref 6–23)
CO2: 26 mEq/L (ref 19–32)
Calcium: 10.3 mg/dL (ref 8.4–10.5)
Chloride: 105 mEq/L (ref 96–112)
Creatinine, Ser: 0.88 mg/dL (ref 0.40–1.50)
GFR: 84.99 mL/min (ref >60.00)
Glucose, Bld: 94 mg/dL (ref 70–99)
Potassium: 3.8 mEq/L (ref 3.5–5.1)
Sodium: 140 mEq/L (ref 135–145)
Total Bilirubin: 0.8 mg/dL (ref 0.2–1.2)
Total Protein: 7.2 g/dL (ref 6.0–8.3)

## 2019-09-21 LAB — CBC
HCT: 46.7 % (ref 39.0–52.0)
Hemoglobin: 16.1 g/dL (ref 13.0–17.0)
MCHC: 34.5 g/dL (ref 30.0–36.0)
MCV: 95.4 fl (ref 78.0–100.0)
Platelets: 291 10*3/uL (ref 150.0–400.0)
RBC: 4.89 Mil/uL (ref 4.22–5.81)
RDW: 12.9 % (ref 11.5–15.5)
WBC: 8.3 10*3/uL (ref 4.0–10.5)

## 2019-09-21 LAB — TSH: TSH: 2 u[IU]/mL (ref 0.35–4.50)

## 2019-09-21 LAB — LIPID PANEL
Cholesterol: 146 mg/dL (ref 0–200)
HDL: 40.1 mg/dL (ref >39.00)
LDL Cholesterol: 84 mg/dL (ref 0–99)
NonHDL: 105.5
Total CHOL/HDL Ratio: 4
Triglycerides: 108 mg/dL (ref 0.0–149.0)
VLDL: 21.6 mg/dL (ref 0.0–40.0)

## 2019-09-21 LAB — MICROALBUMIN / CREATININE URINE RATIO
Creatinine,U: 96.7 mg/dL
Microalb Creat Ratio: 0.7 mg/g (ref 0.0–30.0)
Microalb, Ur: <0.7 mg/dL (ref 0.0–1.9)

## 2019-09-21 LAB — HEMOGLOBIN A1C: Hgb A1c MFr Bld: 5.9 % (ref 4.6–6.5)

## 2019-09-21 NOTE — Progress Notes (Signed)
Medical screening examination/treatment/procedure(s) were performed by non-physician practitioner and as supervising physician I was immediately available for consultation/collaboration. I agree with above. Elizabeth A Crawford, MD 

## 2019-09-21 NOTE — Assessment & Plan Note (Signed)
Checking CMP and adjust amlodipine and hctz and losartan as needed.

## 2019-09-21 NOTE — Patient Instructions (Signed)

## 2019-09-21 NOTE — Assessment & Plan Note (Signed)
Checking lipid panel and adjust lipitor 20 mg daily as needed. 

## 2019-09-21 NOTE — Patient Instructions (Signed)
Continue doing brain stimulating activities (puzzles, reading, adult coloring books, staying active) to keep memory sharp.   Continue to eat heart healthy diet (full of fruits, vegetables, whole grains, lean protein, water--limit salt, fat, and sugar intake) and increase physical activity as tolerated.   Edward Thornton , Thank you for taking time to come for your Medicare Wellness Visit. I appreciate your ongoing commitment to your health goals. Please review the following plan we discussed and let me know if I can assist you in the future.   These are the goals we discussed: Goals    . Exercise 150 minutes per week (moderate activity)     You walks a lot 2 days a week Will get pedometer;  Older adults aged 80 or older, who are generally fit and have no health conditions that limit their mobility, should try to be active daily and should do: at least 150 minutes of moderate aerobic activity such as cycling or walking every week, and  strength exercises on two or more days a week that work all the major muscles (legs, hips, back, abdomen, chest, shoulders and arms).      OR  75 minutes of vigorous aerobic activity such as running or a game of singles tennis every week, and  strength exercises on two or more days a week that work all the major muscles (legs, hips, back, abdomen, chest, shoulders and arms).     OR  a mix of moderate and vigorous aerobic activity every week. For example, two 30-minute runs, plus 30 minutes of fast walking, equates to 150 minutes of moderate aerobic activity, and  strength exercises on two or more days a week that work all the major muscles (legs, hips, back, abdomen, chest, shoulders and arms).  A rule of thumb is that one minute of vigorous activity provides the same health benefits as two minutes of moderate activity. You should also try to break up long periods of sitting with light activity, as sedentary behaviour is now considered an independent risk factor  for ill health, no matter how much exercise you do. Find out why sitting is bad for your health. Older adults at risk of falls, such as people with weak legs, poor balance and some medical conditions, should do exercises to improve balance and co-ordination on at least two days a week. Examples include yoga, tai chi and dancing.      . Patient Stated     Monitor what I eat more closely and increase the amount of physical activity I do. Maintain current health status, stay as healthy and as independent as possible.       This is a list of the screening recommended for you and due dates:  Health Maintenance  Topic Date Due  . Colon Cancer Screening  07/25/2022  . Tetanus Vaccine  12/04/2026  . Flu Shot  Completed  .  Hepatitis C: One time screening is recommended by Center for Disease Control  (CDC) for  adults born from [birth year] through 1965.   Completed  . Pneumonia vaccines  Completed    Preventive Care 50 Years and Older, Male Preventive care refers to lifestyle choices and visits with your health care provider that can promote health and wellness. This includes:  A yearly physical exam. This is also called an annual well check.  Regular dental and eye exams.  Immunizations.  Screening for certain conditions.  Healthy lifestyle choices, such as diet and exercise. What can I expect for my preventive  care visit? Physical exam Your health care provider will check:  Height and weight. These may be used to calculate body mass index (BMI), which is a measurement that tells if you are at a healthy weight.  Heart rate and blood pressure.  Your skin for abnormal spots. Counseling Your health care provider may ask you questions about:  Alcohol, tobacco, and drug use.  Emotional well-being.  Home and relationship well-being.  Sexual activity.  Eating habits.  History of falls.  Memory and ability to understand (cognition).  Work and work Statistician. What immunizations  do I need?  Influenza (flu) vaccine  This is recommended every year. Tetanus, diphtheria, and pertussis (Tdap) vaccine  You may need a Td booster every 10 years. Varicella (chickenpox) vaccine  You may need this vaccine if you have not already been vaccinated. Zoster (shingles) vaccine  You may need this after age 75. Pneumococcal conjugate (PCV13) vaccine  One dose is recommended after age 55. Pneumococcal polysaccharide (PPSV23) vaccine  One dose is recommended after age 75. Measles, mumps, and rubella (MMR) vaccine  You may need at least one dose of MMR if you were born in 1957 or later. You may also need a second dose. Meningococcal conjugate (MenACWY) vaccine  You may need this if you have certain conditions. Hepatitis A vaccine  You may need this if you have certain conditions or if you travel or work in places where you may be exposed to hepatitis A. Hepatitis B vaccine  You may need this if you have certain conditions or if you travel or work in places where you may be exposed to hepatitis B. Haemophilus influenzae type b (Hib) vaccine  You may need this if you have certain conditions. You may receive vaccines as individual doses or as more than one vaccine together in one shot (combination vaccines). Talk with your health care provider about the risks and benefits of combination vaccines. What tests do I need? Blood tests  Lipid and cholesterol levels. These may be checked every 5 years, or more frequently depending on your overall health.  Hepatitis C test.  Hepatitis B test. Screening  Lung cancer screening. You may have this screening every year starting at age 52 if you have a 30-pack-year history of smoking and currently smoke or have quit within the past 15 years.  Colorectal cancer screening. All adults should have this screening starting at age 38 and continuing until age 89. Your health care provider may recommend screening at age 38 if you are at  increased risk. You will have tests every 1-10 years, depending on your results and the type of screening test.  Prostate cancer screening. Recommendations will vary depending on your family history and other risks.  Diabetes screening. This is done by checking your blood sugar (glucose) after you have not eaten for a while (fasting). You may have this done every 1-3 years.  Abdominal aortic aneurysm (AAA) screening. You may need this if you are a current or former smoker.  Sexually transmitted disease (STD) testing. Follow these instructions at home: Eating and drinking  Eat a diet that includes fresh fruits and vegetables, whole grains, lean protein, and low-fat dairy products. Limit your intake of foods with high amounts of sugar, saturated fats, and salt.  Take vitamin and mineral supplements as recommended by your health care provider.  Do not drink alcohol if your health care provider tells you not to drink.  If you drink alcohol: ? Limit how much you have  to 0-2 drinks a day. ? Be aware of how much alcohol is in your drink. In the [place] one drink equals one 12 oz bottle of beer (355 mL), one 5 oz glass of Kiyo Heal (148 mL), or one 1 oz glass of hard liquor (44 mL). Lifestyle  Take daily care of your teeth and gums.  Stay active. Exercise for at least 30 minutes on 5 or more days each week.  Do not use any products that contain nicotine or tobacco, such as cigarettes, e-cigarettes, and chewing tobacco. If you need help quitting, ask your health care provider.  If you are sexually active, practice safe sex. Use a condom or other form of protection to prevent STIs (sexually transmitted infections).  Talk with your health care provider about taking a low-dose aspirin or statin. What's next?  Visit your health care provider once a year for a well check visit.  Ask your health care provider how often you should have your eyes and teeth checked.  Stay up to date on all vaccines.  This information is not intended to replace advice given to you by your health care provider. Make sure you discuss any questions you have with your health care provider. Document Released: 12/23/2015 Document Revised: 11/20/2018 Document Reviewed: 11/20/2018 Elsevier Patient Education  2020 [employer].

## 2019-09-21 NOTE — Progress Notes (Signed)
   Subjective:   Patient ID: Edward Thornton, male    DOB: November 10, 1946, 73 y.o.   MRN: [id number]  HPI The patient is a 73 YO man coming in for physical.  PMH, [place], social history reviewed and updated  Review of Systems  Constitutional: Negative.   HENT: Negative.   Eyes: Negative.   Respiratory: Negative for cough, chest tightness and shortness of breath.   Cardiovascular: Negative for chest pain, palpitations and leg swelling.  Gastrointestinal: Negative for abdominal distention, abdominal pain, constipation, diarrhea, nausea and vomiting.  Musculoskeletal: Negative.   Skin: Negative.   Neurological: Negative.   Psychiatric/Behavioral: Negative.     Objective:  Physical Exam Constitutional:      Appearance: He is well-developed.  HENT:     Head: Normocephalic and atraumatic.  Neck:     Musculoskeletal: Normal range of motion.  Cardiovascular:     Rate and Rhythm: Normal rate and regular rhythm.  Pulmonary:     Effort: Pulmonary effort is normal. No respiratory distress.     Breath sounds: Normal breath sounds. No wheezing or rales.     Comments: Smells of tobacco smoke Abdominal:     General: Bowel sounds are normal. There is no distension.     Palpations: Abdomen is soft.     Tenderness: There is no abdominal tenderness. There is no rebound.  Skin:    General: Skin is warm and dry.  Neurological:     Mental Status: He is alert and oriented to person, place, and time.     Coordination: Coordination normal.     Vitals:   09/21/19 0931 09/21/19 0957  BP: (!) 141/72 122/80  Pulse: 70   Temp: 98 F (36.7 C)   TempSrc: Oral   SpO2: 98%   Weight: 186 lb (84.4 kg)   Height: 6' (1.829 m)     Assessment & Plan:

## 2019-09-21 NOTE — Assessment & Plan Note (Signed)
Flu shot up to date. Pneumonia complete. Shingrix counseled. Tetanus up to date. Colonoscopy up to date. Counseled about sun safety and mole surveillance. Counseled about the dangers of distracted driving. Given 10 year screening recommendations.

## 2019-09-21 NOTE — Assessment & Plan Note (Signed)
Advised to quit and he is not sure if now is a good time with pandemic but will think about it. Denies significant SOB.

## 2019-10-12 ENCOUNTER — Telehealth: Payer: Self-pay | Admitting: Acute Care

## 2019-10-12 NOTE — Telephone Encounter (Signed)
Edward Thornton, can you call pt to schedule f/u low dose ct?

## 2019-10-13 NOTE — Telephone Encounter (Signed)
I have [name] LCS CT scheduled on 11/13/2019 @ 2:30pm and Mrs. Edward Thornton is aware of the appt and location

## 2019-11-02 ENCOUNTER — Other Ambulatory Visit: Payer: Self-pay | Admitting: Internal Medicine

## 2019-11-13 ENCOUNTER — Other Ambulatory Visit: Payer: Self-pay

## 2019-11-13 ENCOUNTER — Ambulatory Visit (INDEPENDENT_AMBULATORY_CARE_PROVIDER_SITE_OTHER)
Admission: RE | Admit: 2019-11-13 | Discharge: 2019-11-13 | Disposition: A | Discharge status: Home / Self Care | Payer: Medicare Other | Source: Ambulatory Visit | Attending: Acute Care | Admitting: Acute Care

## 2019-11-13 DIAGNOSIS — Z87891 Personal history of nicotine dependence: Secondary | ICD-10-CM

## 2019-11-13 DIAGNOSIS — Z122 Encounter for screening for malignant neoplasm of respiratory organs: Secondary | ICD-10-CM

## 2019-11-13 DIAGNOSIS — F17210 Nicotine dependence, cigarettes, uncomplicated: Secondary | ICD-10-CM

## 2019-11-18 ENCOUNTER — Other Ambulatory Visit: Payer: Self-pay | Admitting: *Deleted

## 2019-11-18 DIAGNOSIS — Z122 Encounter for screening for malignant neoplasm of respiratory organs: Secondary | ICD-10-CM

## 2019-11-18 DIAGNOSIS — F17210 Nicotine dependence, cigarettes, uncomplicated: Secondary | ICD-10-CM

## 2019-11-18 DIAGNOSIS — Z87891 Personal history of nicotine dependence: Secondary | ICD-10-CM

## 2019-11-19 ENCOUNTER — Telehealth: Payer: Self-pay

## 2019-11-19 NOTE — Telephone Encounter (Signed)
LVM with MD response  

## 2019-11-19 NOTE — Telephone Encounter (Signed)
I don't see any such screening. Are they trying to send [place] a report about a screening that they did?

## 2019-11-19 NOTE — Telephone Encounter (Signed)
Copied from [place] [id number]. Topic: General - Other >> Nov 19, 2019  1:31 PM Rainey Pines A wrote: Brayton Layman with [facility] is requesting callback from nurse at  [phone] in regards to Patients PAD screening that showed that the right foot was 0.98 mild and the left foot was 0.68 moderate. Please advise

## 2019-11-20 NOTE — Telephone Encounter (Signed)
This is a screening done once a year.   This is an Pharmacist, hospital for the provider. Will have faxed over today.

## 2019-12-31 DIAGNOSIS — L570 Actinic keratosis: Secondary | ICD-10-CM | POA: Diagnosis not present

## 2019-12-31 DIAGNOSIS — L821 Other seborrheic keratosis: Secondary | ICD-10-CM | POA: Diagnosis not present

## 2020-01-15 ENCOUNTER — Ambulatory Visit: Payer: Medicare Other | Attending: Internal Medicine

## 2020-01-15 DIAGNOSIS — Z23 Encounter for immunization: Secondary | ICD-10-CM | POA: Insufficient documentation

## 2020-01-15 NOTE — Progress Notes (Signed)
   Covid-19 Vaccination Clinic  Name:  Edward Thornton    MRN: [id number] DOB: [birth year]  01/15/2020  [name] was observed post Covid-19 immunization for 15 minutes without incidence. He was provided with Vaccine Information Sheet and instruction to access the V-Safe system.   [name] was instructed to call 911 with any severe reactions post vaccine: Marland Kitchen Difficulty breathing  . Swelling of your face and throat  . A fast heartbeat  . A bad rash all over your body  . Dizziness and weakness    Immunizations Administered    Name Date Dose VIS Date Route   Pfizer COVID-19 Vaccine 01/15/2020  3:45 PM 0.3 mL 11/20/2019 Intramuscular   Manufacturer: [place]   Lot: [id number]   [place]: [id number]

## 2020-01-18 ENCOUNTER — Ambulatory Visit: Payer: Medicare Other

## 2020-02-05 ENCOUNTER — Ambulatory Visit: Payer: Medicare Other

## 2020-02-09 ENCOUNTER — Ambulatory Visit: Payer: Medicare Other | Attending: Internal Medicine

## 2020-02-09 DIAGNOSIS — Z23 Encounter for immunization: Secondary | ICD-10-CM

## 2020-02-09 NOTE — Progress Notes (Signed)
   Covid-19 Vaccination Clinic  Name:  Edward Thornton    MRN: [id number] DOB: 1946-12-06  02/09/2020  [name] was observed post Covid-19 immunization for 15 minutes without incident. He was provided with Vaccine Information Sheet and instruction to access the V-Safe system.   [name] was instructed to call 911 with any severe reactions post vaccine: Marland Kitchen Difficulty breathing  . Swelling of face and throat  . A fast heartbeat  . A bad rash all over body  . Dizziness and weakness   Immunizations Administered    Name Date Dose VIS Date Route   Pfizer COVID-19 Vaccine 02/09/2020  3:15 PM 0.3 mL 11/20/2019 Intramuscular   Manufacturer: [place]   Lot: [id number]   [place]: [id number]

## 2020-06-03 DIAGNOSIS — H353111 Nonexudative age-related macular degeneration, right eye, early dry stage: Secondary | ICD-10-CM | POA: Diagnosis not present

## 2020-06-03 DIAGNOSIS — H52203 Unspecified astigmatism, bilateral: Secondary | ICD-10-CM | POA: Diagnosis not present

## 2020-06-03 DIAGNOSIS — Z961 Presence of intraocular lens: Secondary | ICD-10-CM | POA: Diagnosis not present

## 2020-08-09 DIAGNOSIS — L720 Epidermal cyst: Secondary | ICD-10-CM | POA: Diagnosis not present

## 2020-09-10 ENCOUNTER — Encounter: Payer: Self-pay | Admitting: Internal Medicine

## 2020-09-22 ENCOUNTER — Encounter: Payer: Medicare Other | Admitting: Internal Medicine

## 2020-09-22 ENCOUNTER — Ambulatory Visit: Payer: Medicare Other

## 2020-10-12 DIAGNOSIS — D1801 Hemangioma of skin and subcutaneous tissue: Secondary | ICD-10-CM | POA: Diagnosis not present

## 2020-10-12 DIAGNOSIS — D225 Melanocytic nevi of trunk: Secondary | ICD-10-CM | POA: Diagnosis not present

## 2020-10-12 DIAGNOSIS — L821 Other seborrheic keratosis: Secondary | ICD-10-CM | POA: Diagnosis not present

## 2020-10-12 DIAGNOSIS — L814 Other melanin hyperpigmentation: Secondary | ICD-10-CM | POA: Diagnosis not present

## 2020-10-12 DIAGNOSIS — Z85828 Personal history of other malignant neoplasm of skin: Secondary | ICD-10-CM | POA: Diagnosis not present

## 2020-10-12 DIAGNOSIS — L720 Epidermal cyst: Secondary | ICD-10-CM | POA: Diagnosis not present

## 2020-10-21 ENCOUNTER — Telehealth: Payer: Self-pay | Admitting: Internal Medicine

## 2020-10-24 NOTE — Telephone Encounter (Signed)
I have spoken with the Wife and we have the LCS CT scheduled for 11/14/2020 @10 :30am at [facility] Patient is aware of the appt and location

## 2020-10-24 NOTE — Telephone Encounter (Signed)
Edward Thornton, pt is calling to schedule yearly lung screening CT.

## 2020-11-08 ENCOUNTER — Ambulatory Visit (INDEPENDENT_AMBULATORY_CARE_PROVIDER_SITE_OTHER): Payer: Medicare Other | Admitting: Internal Medicine

## 2020-11-08 ENCOUNTER — Encounter: Payer: Self-pay | Admitting: Internal Medicine

## 2020-11-08 ENCOUNTER — Ambulatory Visit (INDEPENDENT_AMBULATORY_CARE_PROVIDER_SITE_OTHER): Payer: Medicare Other

## 2020-11-08 ENCOUNTER — Other Ambulatory Visit: Payer: Self-pay

## 2020-11-08 VITALS — BP 130/70 | HR 66 | Temp 98.2°F | Ht 72.0 in | Wt 183.2 lb

## 2020-11-08 VITALS — BP 130/70 | HR 66 | Temp 98.2°F | Ht 72.0 in | Wt 183.0 lb

## 2020-11-08 DIAGNOSIS — I10 Essential (primary) hypertension: Secondary | ICD-10-CM | POA: Diagnosis not present

## 2020-11-08 DIAGNOSIS — Z0000 Encounter for general adult medical examination without abnormal findings: Secondary | ICD-10-CM | POA: Diagnosis not present

## 2020-11-08 DIAGNOSIS — Z720 Tobacco use: Secondary | ICD-10-CM | POA: Diagnosis not present

## 2020-11-08 DIAGNOSIS — E782 Mixed hyperlipidemia: Secondary | ICD-10-CM

## 2020-11-08 LAB — COMPREHENSIVE METABOLIC PANEL
ALT: 13 U/L (ref 0–53)
AST: 14 U/L (ref 0–37)
Albumin: 4.3 g/dL (ref 3.5–5.2)
Alkaline Phosphatase: 65 U/L (ref 39–117)
BUN: 14 mg/dL (ref 6–23)
CO2: 31 mEq/L (ref 19–32)
Calcium: 9.5 mg/dL (ref 8.4–10.5)
Chloride: 102 mEq/L (ref 96–112)
Creatinine, Ser: 0.86 mg/dL (ref 0.40–1.50)
GFR: 85.78 mL/min (ref >60.00)
Glucose, Bld: 90 mg/dL (ref 70–99)
Potassium: 3.7 mEq/L (ref 3.5–5.1)
Sodium: 139 mEq/L (ref 135–145)
Total Bilirubin: 0.8 mg/dL (ref 0.2–1.2)
Total Protein: 7.4 g/dL (ref 6.0–8.3)

## 2020-11-08 LAB — LIPID PANEL
Cholesterol: 145 mg/dL (ref 0–200)
HDL: 45.8 mg/dL (ref >39.00)
LDL Cholesterol: 76 mg/dL (ref 0–99)
NonHDL: 99.24
Total CHOL/HDL Ratio: 3
Triglycerides: 116 mg/dL (ref 0.0–149.0)
VLDL: 23.2 mg/dL (ref 0.0–40.0)

## 2020-11-08 LAB — TSH: TSH: 2.02 u[IU]/mL (ref 0.35–4.50)

## 2020-11-08 LAB — HEMOGLOBIN A1C: Hgb A1c MFr Bld: 5.8 % (ref 4.6–6.5)

## 2020-11-08 LAB — MICROALBUMIN / CREATININE URINE RATIO
Creatinine,U: 51.7 mg/dL
Microalb Creat Ratio: 1.4 mg/g (ref 0.0–30.0)
Microalb, Ur: <0.7 mg/dL (ref 0.0–1.9)

## 2020-11-08 LAB — CBC
HCT: 48.6 % (ref 39.0–52.0)
Hemoglobin: 16.3 g/dL (ref 13.0–17.0)
MCHC: 33.6 g/dL (ref 30.0–36.0)
MCV: 94.5 fl (ref 78.0–100.0)
Platelets: 284 10*3/uL (ref 150.0–400.0)
RBC: 5.14 Mil/uL (ref 4.22–5.81)
RDW: 12.8 % (ref 11.5–15.5)
WBC: 7.2 10*3/uL (ref 4.0–10.5)

## 2020-11-08 NOTE — Patient Instructions (Signed)

## 2020-11-08 NOTE — Progress Notes (Signed)
° °  Subjective:   Patient ID: Edward Thornton, male    DOB: 10-26-1946, 74 y.o.   MRN: [id number]  HPI The patient is a 74 YO man coming in for physical.   PMH, [place], social history reviewed and updated  Review of Systems  Constitutional: Negative.   HENT: Negative.   Eyes: Negative.   Respiratory: Positive for cough. Negative for chest tightness and shortness of breath.        Chronic  Cardiovascular: Negative for chest pain, palpitations and leg swelling.  Gastrointestinal: Negative for abdominal distention, abdominal pain, constipation, diarrhea, nausea and vomiting.  Musculoskeletal: Positive for arthralgias.  Skin: Negative.   Neurological: Negative.   Psychiatric/Behavioral: Negative.     Objective:  Physical Exam Constitutional:      Appearance: He is well-developed.  HENT:     Head: Normocephalic and atraumatic.  Cardiovascular:     Rate and Rhythm: Normal rate and regular rhythm.  Pulmonary:     Effort: Pulmonary effort is normal. No respiratory distress.     Breath sounds: Normal breath sounds. No wheezing or rales.  Abdominal:     General: Bowel sounds are normal. There is no distension.     Palpations: Abdomen is soft.     Tenderness: There is no abdominal tenderness. There is no rebound.  Musculoskeletal:     Cervical back: Normal range of motion.  Skin:    General: Skin is warm and dry.  Neurological:     Mental Status: He is alert and oriented to person, place, and time.     Coordination: Coordination normal.     Vitals:   11/08/20 0936  BP: 130/70  Pulse: 66  Temp: 98.2 F (36.8 C)  TempSrc: Oral  SpO2: 94%  Weight: 183 lb (83 kg)  Height: 6' (1.829 m)    This visit occurred during the SARS-CoV-2 public health emergency.  Safety protocols were in place, including screening questions prior to the visit, additional usage of staff PPE, and extensive cleaning of exam room while observing appropriate contact time as indicated for disinfecting  solutions.   Assessment & Plan:

## 2020-11-08 NOTE — Progress Notes (Signed)
Subjective:   Edward Thornton is a 74 y.o. male who presents for Medicare Annual/Subsequent preventive examination.  Review of Systems    No ROS. Medicare Wellness Visit. Additional risk factors are reflected in social history. Cardiac Risk Factors include: advanced age (>3men, >17 women);dyslipidemia;family history of premature cardiovascular disease;hypertension;male gender Sleep Patterns: No sleep issues, feels rested on waking and sleeps 8 hours nightly. Home Safety/Smoke Alarms: Feels safe in home; uses home alarm. Smoke alarms in place. Living environment: 2-story home; Lives with spouse; no needs for DME; good support system. Seat Belt Safety/Bike Helmet: Wears seat belt.    Objective:    Today's Vitals   11/08/20 0931  BP: 130/70  Pulse: 66  Temp: 98.2 F (36.8 C)  SpO2: 94%  Weight: 183 lb 3.2 oz (83.1 kg)  Height: 6' (1.829 m)  PainSc: 0-No pain   Body mass index is 24.85 kg/m.  Advanced Directives 11/08/2020 09/21/2019 09/19/2018 08/22/2016 08/02/2016  Does Patient Have a Medical Advance Directive? Yes Yes Yes Yes Yes  Type of Paramedic of [place];Living will [place];Living will [place];Living will [place];Living will -  Does patient want to make changes to medical advance directive? No - Patient declined - - No - Patient declined -  Copy of [place] Hacker in Chart? No - copy requested No - copy requested No - copy requested No - copy requested No - copy requested    Current Medications (verified) Outpatient Encounter Medications as of 11/08/2020  Medication Sig  . amLODipine (NORVASC) 10 MG tablet TAKE 1 TABLET BY MOUTH  DAILY  . aspirin 81 MG tablet Take 81 mg by mouth daily.  Marland Kitchen atorvastatin (LIPITOR) 20 MG tablet TAKE 1 TABLET BY MOUTH  DAILY  . cholecalciferol (VITAMIN D) 1000 units tablet Take 1 tablet (1,000 Units total) by mouth 2 (two) times daily. Takes  2 tablets twice daily  . hydrochlorothiazide (HYDRODIURIL) 25 MG tablet TAKE 1 TABLET BY MOUTH  DAILY  . losartan (COZAAR) 100 MG tablet TAKE 1 TABLET BY MOUTH  DAILY  . meclizine (ANTIVERT) 25 MG tablet Take 1 tablet (25 mg total) by mouth 3 (three) times daily as needed for dizziness.  . naproxen sodium (ALEVE) 220 MG tablet Take 220 mg by mouth as needed.  . Omega-3 Fatty Acids (FISH OIL) 1200 MG CAPS Take 1 capsule (1,200 mg total) by mouth daily. Take 1 capsule daily   No facility-administered encounter medications on file as of 11/08/2020.    Allergies (verified) Patient has no known allergies.   History: Past Medical History:  Diagnosis Date  . Arthritis   . 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 Hyperlipidemia   . Hypertension    Past Surgical History:  Procedure Laterality Date  . adominal hernia  1990  . HERNIA REPAIR Left 12/10/1997  . KNEE ARTHROSCOPY  1990   right  . KNEE ARTHROSCOPY  2010   left  . PILONIDAL CYST EXCISION  1980   Family History  Problem Relation Age of Onset  . Stroke Mother   . Heart disease Mother   . Stroke Father   . Heart disease Father   . Colon cancer Neg Hx   . Stomach cancer Neg Hx    Social History   Socioeconomic History  . Marital status: Married    Spouse name: Not on file  . Number of children: 2  . Years of education:  Not on file  . Highest education level: Not on file  Occupational History  . Occupation: Insurance account manager, part-time/ retired  Tobacco Use  . Smoking status: Current Every Day Smoker    Packs/day: 1.00    Years: 50.00    Pack years: 50.00    Types: Cigarettes    Start date: 12/10/1964  . Smokeless tobacco: Never Used  Vaping Use  . Vaping Use: Never used  Substance and Sexual Activity  . Alcohol use: Yes    Alcohol/week: 3.0 standard drinks    Types: 3 Cans of beer per week    Comment: drinks beer; 3 or 4 a week   . Drug use: No  . Sexual activity: Yes  Other Topics Concern  . Not on file    Social History Narrative  . Not on file   Social Determinants of Health   Financial Resource Strain: Low Risk   . Difficulty of Paying Living Expenses: Not hard at all  Food Insecurity: No Food Insecurity  . Worried About Charity fundraiser in the Last Year: Never true  . Ran Out of Food in the Last Year: Never true  Transportation Needs: No Transportation Needs  . Lack of Transportation (Medical): No  . Lack of Transportation (Non-Medical): No  Physical Activity: Sufficiently Active  . Days of Exercise per Week: 5 days  . Minutes of Exercise per Session: 30 min  Stress: No Stress Concern Present  . Feeling of Stress : Not at all  Social Connections: Socially Integrated  . Frequency of Communication with Friends and Family: More than three times a week  . Frequency of Social Gatherings with Friends and Family: More than three times a week  . Attends Religious Services: More than 4 times per year  . Active Member of Clubs or Organizations: Yes  . Attends Archivist Meetings: More than 4 times per year  . Marital Status: Married    Tobacco Counseling Ready to quit: Not Answered Counseling given: Not Answered   Clinical Intake:  Pre-visit preparation completed: Yes  Pain Score: 0-No pain     BMI - recorded: 24.85 Nutritional Status: BMI of 19-24  Normal Nutritional Risks: None Diabetes: No  How often do you need to have someone help you when you read instructions, pamphlets, or other written materials from your doctor or pharmacy?: 1 - Never What is the last grade level you completed in school?: 11th grade  Diabetic? no  Interpreter Needed?: No  Information entered by :: Lisette Abu, LPN   Activities of Daily Living In your present state of health, do you have any difficulty performing the following activities: 11/08/2020  Hearing? Y  Comment wears hearing aids  Vision? N  Difficulty concentrating or making decisions? N  Walking or climbing  stairs? N  Dressing or bathing? N  Doing errands, shopping? N  Preparing Food and eating ? N  Using the Toilet? N  In the past six months, have you accidently leaked urine? N  Do you have problems with loss of bowel control? N  Managing your Medications? N  Managing your Finances? N  Housekeeping or managing your Housekeeping? N  Some recent data might be hidden    Patient Care Team: Hoyt Koch, MD as PCP - General (Internal Medicine) Rolm Bookbinder, MD as Consulting Physician (Dermatology) Magdalen Spatz, NP as Nurse Practitioner (Pulmonary Disease)  Indicate any recent Medical Services you may have received from other than [facility] providers in the  past year (date may be approximate).     Assessment:   This is a routine wellness examination for [place].  Hearing/Vision screen No exam data present  Dietary issues and exercise activities discussed: Current Exercise Habits: Home exercise routine, Type of exercise: walking;Other - see comments (yard work, walk the dog), Time (Minutes): 30, Frequency (Times/Week): 5, Weekly Exercise (Minutes/Week): 150, Intensity: Moderate, Exercise limited by: respiratory conditions(s)  Goals    . Patient Stated     Monitor what I eat more closely and increase the amount of physical activity I do. Maintain current health status, stay as healthy and as independent as possible.      Depression Screen PHQ 2/9 Scores 11/08/2020 09/21/2019 09/19/2018 09/16/2017 08/02/2016  PHQ - 2 Score 0 0 0 0 0    Fall Risk Fall Risk  11/08/2020 09/21/2019 09/19/2018 09/16/2017 08/02/2016  Falls in the past year? 1 0 No No No  Number falls in past yr: 0 0 - - -  Injury with Fall? 0 0 - - -  Risk for fall due to : No Fall Risks - - - -  Follow up Falls evaluation completed - - - -    Any stairs in or around the home? Yes  If so, are there any without handrails? No  Home free of loose throw rugs in walkways, pet beds, electrical cords, etc? Yes  Adequate  lighting in your home to reduce risk of falls? Yes   ASSISTIVE DEVICES UTILIZED TO PREVENT FALLS:  Life alert? No  Use of a cane, walker or w/c? No  Grab bars in the bathroom? Yes  Shower chair or bench in shower? Yes  Elevated toilet seat or a handicapped toilet? Yes   TIMED UP AND GO:  Was the test performed? No .  Length of time to ambulate 10 feet: 0 sec.   Gait steady and fast without use of assistive device  Cognitive Function: MMSE - Mini Mental State Exam 09/19/2018 08/02/2016  Not completed: Refused (No Data)     6CIT Screen 11/08/2020  What Year? 0 points  What month? 0 points  What time? 0 points  Count back from 20 0 points  Months in reverse 0 points  Repeat phrase 0 points  Total Score 0    Immunizations Immunization History  Administered Date(s) Administered  . Fluad Quad(high Dose 65+) 08/21/2019  . Influenza, High Dose Seasonal PF 08/24/2016, 09/16/2017, 09/19/2018  . Influenza,inj,Quad PF,6+ Mos 08/23/2015  . Influenza-Unspecified 10/02/2011, 09/14/2013, 09/29/2014  . PFIZER SARS-COV-2 Vaccination 01/15/2020, 02/09/2020, 09/10/2020  . Pneumococcal Conjugate-13 09/16/2017  . Pneumococcal Polysaccharide-23 09/19/2018  . Pneumococcal-Unspecified 06/05/2009  . Td 06/05/2005  . Tdap 12/04/2016  . Zoster 12/10/2008    TDAP status: Up to date Flu Vaccine status: Up to date Pneumococcal vaccine status: Up to date Covid-19 vaccine status: Completed vaccines  Qualifies for Shingles Vaccine? Yes   Zostavax completed Yes   Shingrix Completed?: Yes  Screening Tests Health Maintenance  Topic Date Due  . INFLUENZA VACCINE  07/10/2020  . COLONOSCOPY  07/25/2022  . TETANUS/TDAP  12/04/2026  . COVID-19 Vaccine  Completed  . Hepatitis C Screening  Completed  . PNA vac Low Risk Adult  Completed    Health Maintenance  Health Maintenance Due  Topic Date Due  . INFLUENZA VACCINE  07/10/2020    Colorectal cancer screening: Completed 07/25/2012.  Repeat every 10 years  Lung Cancer Screening: (Low Dose CT Chest recommended if Age 39-80 years, 30 pack-year currently smoking  OR have quit w/in 15years.) does qualify.   Lung Cancer Screening Referral: no  Additional Screening:  Hepatitis C Screening: does qualify; Completed yes  Vision Screening: Recommended annual ophthalmology exams for early detection of glaucoma and other disorders of the eye. Is the patient up to date with their annual eye exam?  Yes  Who is the provider or what is the name of the office in which the patient attends annual eye exams? Luberta Mutter, MD. If pt is not established with a provider, would they like to be referred to a provider to establish care? No .   Dental Screening: Recommended annual dental exams for proper oral hygiene  Community Resource Referral / Chronic Care Management: CRR required this visit?  No   CCM required this visit?  No      Plan:     I have personally reviewed and noted the following in the patient's chart:   . Medical and social history . Use of alcohol, tobacco or illicit drugs  . Current medications and supplements . Functional ability and status . Nutritional status . Physical activity . Advanced directives . List of other physicians . Hospitalizations, surgeries, and ER visits in previous 12 months . Vitals . Screenings to include cognitive, depression, and falls . Referrals and appointments  In addition, I have reviewed and discussed with patient certain preventive protocols, quality metrics, and best practice recommendations. A written personalized care plan for preventive services as well as general preventive health recommendations were provided to patient.     Sheral Flow, LPN   [id number]   Nurse Notes: n/a

## 2020-11-08 NOTE — Patient Instructions (Signed)
Edward Thornton , Thank you for taking time to come for your Medicare Wellness Visit. I appreciate your ongoing commitment to your health goals. Please review the following plan we discussed and let me know if I can assist you in the future.   Screening recommendations/referrals: Colonoscopy: 07/25/2012; due every 10 years Recommended yearly ophthalmology/optometry visit for glaucoma screening and checkup Recommended yearly dental visit for hygiene and checkup  Vaccinations: Influenza vaccine: 08/28/2020 Pneumococcal vaccine: up to date Tdap vaccine: 12/04/2016; due every 10 years Shingles vaccine: up to date   Covid-19: up to date  Advanced directives: Please bring a copy of your health care power of attorney and living will to the office at your convenience.  Conditions/risks identified: Yes; Reviewed health maintenance screenings with patient today and relevant education, vaccines, and/or referrals were provided. Please continue to do your personal lifestyle choices by: daily care of teeth and gums, regular physical activity (goal should be 5 days a week for 30 minutes), eat a healthy diet, avoid tobacco and drug use, limiting any alcohol intake, taking a low-dose aspirin (if not allergic or have been advised by your provider otherwise) and taking vitamins and minerals as recommended by your provider. Continue doing brain stimulating activities (puzzles, reading, adult coloring books, staying active) to keep memory sharp. Continue to eat heart healthy diet (full of fruits, vegetables, whole grains, lean protein, water--limit salt, fat, and sugar intake) and increase physical activity as tolerated.  Next appointment: Please schedule your next Medicare Wellness Visit with your Nurse Health Advisor in 1 year by calling [phone].  Preventive Care 73 Years and Older, Male Preventive care refers to lifestyle choices and visits with your health care provider that can promote health and wellness. What  does preventive care include?  A yearly physical exam. This is also called an annual well check.  Dental exams once or twice a year.  Routine eye exams. Ask your health care provider how often you should have your eyes checked.  Personal lifestyle choices, including:  Daily care of your teeth and gums.  Regular physical activity.  Eating a healthy diet.  Avoiding tobacco and drug use.  Limiting alcohol use.  Practicing safe sex.  Taking low doses of aspirin every day.  Taking vitamin and mineral supplements as recommended by your health care provider. What happens during an annual well check? The services and screenings done by your health care provider during your annual well check will depend on your age, overall health, lifestyle risk factors, and family history of disease. Counseling  Your health care provider may ask you questions about your:  Alcohol use.  Tobacco use.  Drug use.  Emotional well-being.  Home and relationship well-being.  Sexual activity.  Eating habits.  History of falls.  Memory and ability to understand (cognition).  Work and work Statistician. Screening  You may have the following tests or measurements:  Height, weight, and BMI.  Blood pressure.  Lipid and cholesterol levels. These may be checked every 5 years, or more frequently if you are over 16 years old.  Skin check.  Lung cancer screening. You may have this screening every year starting at age 73 if you have a 30-pack-year history of smoking and currently smoke or have quit within the past 15 years.  Fecal occult blood test (FOBT) of the stool. You may have this test every year starting at age 35.  Flexible sigmoidoscopy or colonoscopy. You may have a sigmoidoscopy every 5 years or a colonoscopy every 10  years starting at age 35.  Prostate cancer screening. Recommendations will vary depending on your family history and other risks.  Hepatitis C blood test.  Hepatitis  B blood test.  Sexually transmitted disease (STD) testing.  Diabetes screening. This is done by checking your blood sugar (glucose) after you have not eaten for a while (fasting). You may have this done every 1-3 years.  Abdominal aortic aneurysm (AAA) screening. You may need this if you are a current or former smoker.  Osteoporosis. You may be screened starting at age 43 if you are at high risk. Talk with your health care provider about your test results, treatment options, and if necessary, the need for more tests. Vaccines  Your health care provider may recommend certain vaccines, such as:  Influenza vaccine. This is recommended every year.  Tetanus, diphtheria, and acellular pertussis (Tdap, Td) vaccine. You may need a Td booster every 10 years.  Zoster vaccine. You may need this after age 73.  Pneumococcal 13-valent conjugate (PCV13) vaccine. One dose is recommended after age 73.  Pneumococcal polysaccharide (PPSV23) vaccine. One dose is recommended after age 87. Talk to your health care provider about which screenings and vaccines you need and how often you need them. This information is not intended to replace advice given to you by your health care provider. Make sure you discuss any questions you have with your health care provider. Document Released: 12/23/2015 Document Revised: 08/15/2016 Document Reviewed: 09/27/2015 Elsevier Interactive Patient Education  2017 [place] Prevention in the Home Falls can cause injuries. They can happen to people of all ages. There are many things you can do to make your home safe and to help prevent falls. What can I do on the outside of my home?  Regularly fix the edges of walkways and driveways and fix any cracks.  Remove anything that might make you trip as you walk through a door, such as a raised step or threshold.  Trim any bushes or trees on the path to your home.  Use bright outdoor lighting.  Clear any walking  paths of anything that might make someone trip, such as rocks or tools.  Regularly check to see if handrails are loose or broken. Make sure that both sides of any steps have handrails.  Any raised decks and porches should have guardrails on the edges.  Have any leaves, snow, or ice cleared regularly.  Use sand or salt on walking paths during winter.  Clean up any spills in your garage right away. This includes oil or grease spills. What can I do in the bathroom?  Use night lights.  Install grab bars by the toilet and in the tub and shower. Do not use towel bars as grab bars.  Use non-skid mats or decals in the tub or shower.  If you need to sit down in the shower, use a plastic, non-slip stool.  Keep the floor dry. Clean up any water that spills on the floor as soon as it happens.  Remove soap buildup in the tub or shower regularly.  Attach bath mats securely with double-sided non-slip rug tape.  Do not have throw rugs and other things on the floor that can make you trip. What can I do in the bedroom?  Use night lights.  Make sure that you have a light by your bed that is easy to reach.  Do not use any sheets or blankets that are too big for your bed. They should not hang  down onto the floor.  Have a firm chair that has side arms. You can use this for support while you get dressed.  Do not have throw rugs and other things on the floor that can make you trip. What can I do in the kitchen?  Clean up any spills right away.  Avoid walking on wet floors.  Keep items that you use a lot in easy-to-reach places.  If you need to reach something above you, use a strong step stool that has a grab bar.  Keep electrical cords out of the way.  Do not use floor polish or wax that makes floors slippery. If you must use wax, use non-skid floor wax.  Do not have throw rugs and other things on the floor that can make you trip. What can I do with my stairs?  Do not leave any items  on the stairs.  Make sure that there are handrails on both sides of the stairs and use them. Fix handrails that are broken or loose. Make sure that handrails are as long as the stairways.  Check any carpeting to make sure that it is firmly attached to the stairs. Fix any carpet that is loose or worn.  Avoid having throw rugs at the top or bottom of the stairs. If you do have throw rugs, attach them to the floor with carpet tape.  Make sure that you have a light switch at the top of the stairs and the bottom of the stairs. If you do not have them, ask someone to add them for you. What else can I do to help prevent falls?  Wear shoes that:  Do not have high heels.  Have rubber bottoms.  Are comfortable and fit you well.  Are closed at the toe. Do not wear sandals.  If you use a stepladder:  Make sure that it is fully opened. Do not climb a closed stepladder.  Make sure that both sides of the stepladder are locked into place.  Ask someone to hold it for you, if possible.  Clearly mark and make sure that you can see:  Any grab bars or handrails.  First and last steps.  Where the edge of each step is.  Use tools that help you move around (mobility aids) if they are needed. These include:  Canes.  Walkers.  Scooters.  Crutches.  Turn on the lights when you go into a dark area. Replace any light bulbs as soon as they burn out.  Set up your furniture so you have a clear path. Avoid moving your furniture around.  If any of your floors are uneven, fix them.  If there are any pets around you, be aware of where they are.  Review your medicines with your doctor. Some medicines can make you feel dizzy. This can increase your chance of falling. Ask your doctor what other things that you can do to help prevent falls. This information is not intended to replace advice given to you by your health care provider. Make sure you discuss any questions you have with your health care  provider. Document Released: 09/22/2009 Document Revised: 05/03/2016 Document Reviewed: 12/31/2014 Elsevier Interactive Patient Education  2017 [employer].

## 2020-11-11 NOTE — Assessment & Plan Note (Signed)
Flu shot complete, covid-19 up to date including booster. Pneumonia complete. Shingrix complete. Tetanus due 2027. Colonoscopy due 2023. Counseled about sun safety and mole surveillance. Counseled about the dangers of distracted driving. Given 10 year screening recommendations.

## 2020-11-11 NOTE — Assessment & Plan Note (Signed)
Checking lipid panel and adjust lipitor 20 mg daily as needed. 

## 2020-11-11 NOTE — Assessment & Plan Note (Signed)
Advised to quit and he declines ability to make attempt today.

## 2020-11-11 NOTE — Assessment & Plan Note (Signed)
BP at goal on amlodipine and hctz and losartan. Checking CMP and adjust as needed.

## 2020-11-14 ENCOUNTER — Other Ambulatory Visit: Payer: Self-pay

## 2020-11-14 ENCOUNTER — Ambulatory Visit (INDEPENDENT_AMBULATORY_CARE_PROVIDER_SITE_OTHER)
Admission: RE | Admit: 2020-11-14 | Discharge: 2020-11-14 | Disposition: A | Discharge status: Home / Self Care | Payer: Medicare Other | Source: Ambulatory Visit | Attending: Internal Medicine | Admitting: Internal Medicine

## 2020-11-14 DIAGNOSIS — Z122 Encounter for screening for malignant neoplasm of respiratory organs: Secondary | ICD-10-CM

## 2020-11-14 DIAGNOSIS — Z87891 Personal history of nicotine dependence: Secondary | ICD-10-CM | POA: Diagnosis not present

## 2020-11-14 DIAGNOSIS — F17210 Nicotine dependence, cigarettes, uncomplicated: Secondary | ICD-10-CM | POA: Diagnosis not present

## 2020-11-17 DIAGNOSIS — L720 Epidermal cyst: Secondary | ICD-10-CM | POA: Diagnosis not present

## 2020-11-17 NOTE — Progress Notes (Signed)
Please call patient and let them  know their  low dose Ct was read as a Lung RADS 1, negative study: no nodules or definitely benign nodules. Radiology recommendation is for a repeat LDCT in 12 months. .Please let them  know we will order and schedule their  annual screening scan for 11/2021. Please let them  know there was notation of CAD on their  scan.  Please remind the patient  that this is a non-gated exam therefore degree or severity of disease  cannot be determined. Please have them  follow up with their PCP regarding potential risk factor modification, dietary therapy or pharmacologic therapy if clinically indicated. Pt.  is  currently on statin therapy. Please place order for annual  screening scan for  11/2021 and fax results to PCP. Thanks so much.  Please let him know there was notation of CAD and calcifications of his aortic valve, and they suggested an echo. I do not see that he has had one in Epic, but he may be seen by another group.  Have him follow up with his PCP. Thanks so much

## 2020-11-21 ENCOUNTER — Other Ambulatory Visit: Payer: Self-pay | Admitting: *Deleted

## 2020-11-21 DIAGNOSIS — F17210 Nicotine dependence, cigarettes, uncomplicated: Secondary | ICD-10-CM

## 2020-11-21 DIAGNOSIS — Z87891 Personal history of nicotine dependence: Secondary | ICD-10-CM

## 2020-11-28 ENCOUNTER — Other Ambulatory Visit: Payer: Self-pay | Admitting: Internal Medicine

## 2020-11-30 ENCOUNTER — Telehealth: Payer: Self-pay | Admitting: Internal Medicine

## 2020-11-30 NOTE — Progress Notes (Signed)
  Chronic Care Management   Note  11/30/2020 Name: KONG PACKETT MRN: [id number] DOB: Jun 28, 1946  EYDAN CHIANESE is a 74 y.o. year old male who is a primary care patient of Hoyt Koch, MD. I reached out to Jodelle Red by phone today in response to a referral sent by Mr. Frankie Scipio Plass's PCP, Hoyt Koch, MD.   Mr. Barnick was given information about Chronic Care Management services today including:  1. CCM service includes personalized support from designated clinical staff supervised by his physician, including individualized plan of care and coordination with other care providers 2. 24/7 contact phone numbers for assistance for urgent and routine care needs. 3. Service will only be billed when office clinical staff spend 20 minutes or more in a month to coordinate care. 4. Only one practitioner may furnish and bill the service in a calendar month. 5. The patient may stop CCM services at any time (effective at the end of the month) by phone call to the office staff.   Patient agreed to services and verbal consent obtained.   Follow up plan:   Carley Perdue [employer] Scheduler

## 2021-01-11 ENCOUNTER — Ambulatory Visit: Payer: Medicare Other | Admitting: Orthopaedic Surgery

## 2021-01-24 ENCOUNTER — Telehealth: Payer: Self-pay | Admitting: Pharmacist

## 2021-01-24 NOTE — Progress Notes (Signed)
    Chronic Care Management Pharmacy Assistant   Name: Edward Thornton  MRN: [id number] DOB: 01/03/1946  Reason for Encounter: Initial Questions   PCP : Hoyt Koch, MD  Allergies:  No Known Allergies  Medications: Outpatient Encounter Medications as of 01/24/2021  Medication Sig  . amLODipine (NORVASC) 10 MG tablet TAKE 1 TABLET BY MOUTH  DAILY  . aspirin 81 MG tablet Take 81 mg by mouth daily.  Marland Kitchen atorvastatin (LIPITOR) 20 MG tablet TAKE 1 TABLET BY MOUTH  DAILY  . cholecalciferol (VITAMIN D) 1000 units tablet Take 1 tablet (1,000 Units total) by mouth 2 (two) times daily. Takes 2 tablets twice daily  . hydrochlorothiazide (HYDRODIURIL) 25 MG tablet TAKE 1 TABLET BY MOUTH  DAILY  . losartan (COZAAR) 100 MG tablet TAKE 1 TABLET BY MOUTH  DAILY  . meclizine (ANTIVERT) 25 MG tablet Take 1 tablet (25 mg total) by mouth 3 (three) times daily as needed for dizziness.  . naproxen sodium (ALEVE) 220 MG tablet Take 220 mg by mouth as needed.  . Omega-3 Fatty Acids (FISH OIL) 1200 MG CAPS Take 1 capsule (1,200 mg total) by mouth daily. Take 1 capsule daily   No facility-administered encounter medications on file as of 01/24/2021.    Current Diagnosis: Patient Active Problem List   Diagnosis Date Noted  . Tobacco abuse 09/19/2018  . Routine general medical examination at a health care facility 08/24/2016  . BPV (benign positional vertigo) 08/02/2016  . Essential hypertension 08/26/2015  . Hyperlipidemia 08/26/2015    Goals Addressed   None     Follow-Up:  Pharmacist Review   Have you seen any other providers since your last visit? The patient states that he last seen a dermatologist to have a cyst removed from face  Any changes in your medications or health? The patient states that there have been no medication changes  Any side effects from any medications? The patient is not having any side effects from medications  Do you have an symptoms or problems not managed by  your medications? The patient does not have any symptoms or problems not managed by medication  Any concerns about your health right now? The patient states that he does not have any concerns about his health at this time  Has your provider asked that you check blood pressure, blood sugar, or follow special diet at home? The patient states that his blood pressure is staying under control with medication  Do you get any type of exercise on a regular basis? The patient states he does not exercise  Can you think of a goal you would like to reach for your health? The patient states that he does not have any health goals besides just trying to stay healthy  Do you have any problems getting your medications? The patient states that he does not have any problems with getting medications from pharmacy  Is there anything that you would like to discuss during the appointment? The patient states there is not anything specific to discuss at this time  Please bring medications and supplements to appointment   Wendy Poet, [place] [phone]   Total time:25 Patient expecting phone call on mobile number

## 2021-01-27 ENCOUNTER — Ambulatory Visit (INDEPENDENT_AMBULATORY_CARE_PROVIDER_SITE_OTHER): Payer: Medicare Other | Admitting: Pharmacist

## 2021-01-27 ENCOUNTER — Other Ambulatory Visit: Payer: Self-pay

## 2021-01-27 DIAGNOSIS — E782 Mixed hyperlipidemia: Secondary | ICD-10-CM | POA: Diagnosis not present

## 2021-01-27 DIAGNOSIS — I10 Essential (primary) hypertension: Secondary | ICD-10-CM

## 2021-01-27 DIAGNOSIS — M170 Bilateral primary osteoarthritis of knee: Secondary | ICD-10-CM | POA: Diagnosis not present

## 2021-01-27 DIAGNOSIS — Z720 Tobacco use: Secondary | ICD-10-CM

## 2021-01-27 NOTE — Patient Instructions (Addendum)
Visit Information  Phone number for Pharmacist: [phone]  Thank you for meeting with me to discuss your medications! I look forward to working with you to achieve your health care goals. Below is a summary of what we talked about during the visit:  Goals Addressed            This Visit's Progress   . Manage My Medicine       Timeframe:  Long-Range Goal Priority:  Medium Start Date:       01/27/21                      Expected End Date:   01/27/22                    Follow Up Date 08/09/21   - call for medicine refill 2 or 3 days before it runs out - call if I am sick and can't take my medicine - keep a list of all the medicines I take; vitamins and herbals too - use a pillbox to sort medicine  -Use Tylenol instead of Aleve. May use up to 1000 mg 3 times daily if needed. -Can try time off turmeric to see if it is making a difference   Why is this important?   . These steps will help you keep on track with your medicines.   Notes:       Patient Care Plan: CCM Pharmacy Care Plan    Problem Identified: Hypertension, Hyperlipidemia, Osteoarthritis and Tobacco use   Priority: High    Long-Range Goal: Disease management   Start Date: 01/27/2021  Expected End Date: 01/27/2022  This Visit's Progress: On track  Priority: High  Note:   Current Barriers:  . Unable to independently monitor therapeutic efficacy . Suboptimal therapeutic regimen for athritis  Pharmacist Clinical Goal(s):  Marland Kitchen Over the next 365 days, patient will achieve adherence to monitoring guidelines and medication adherence to achieve therapeutic efficacy . adhere to plan to optimize therapeutic regimen for arthritis as evidenced by report of adherence to recommended medication management changes through collaboration with PharmD and provider.   Interventions: . 1:1 collaboration with Hoyt Koch, MD regarding development and update of comprehensive plan of care as evidenced by provider attestation  and co-signature . Inter-disciplinary care team collaboration (see longitudinal plan of care) . Comprehensive medication review performed; medication list updated in electronic medical record  Hypertension (BP goal <130/80) -controlled -Current treatment: . Amlodipine 10 mg daily AM . HCTZ 25 mg daily AM . Losartan 100 mg daily AM -Current home readings: checks occasionally, typically 120-130/60-70s -Current exercise habits: walks around neighborhood occasionally -Denies hypotensive/hypertensive symptoms -Educated on BP goals and benefits of medications for prevention of heart attack, stroke and kidney damage; Daily salt intake goal < 2300 mg; Exercise goal of 150 minutes per week; -Counseled to monitor BP at home as needed, document, and provide log at future appointments -Recommended to continue current medication  Hyperlipidemia: (LDL goal < 100) -controlled -Current treatment: . Atorvastatin 20 mg daily PM . Aspirin 81 mg daily . Omega-3 FA Fish oil 1200 mg daily -Educated on Cholesterol goals;  Benefits of statin for ASCVD risk reduction; -Recommended to continue current medication  Tobacco use (Goal: quit smoking) -uncontrolled - smoking 1/2 ppd, down from 1 ppd previously -Patient triggers include: drinking coffee and seeing someone else smoke -On a scale of 1-10, reports MOTIVATION to quit is low -On a scale of 1-10, reports CONFIDENCE in quitting  is low -Previous quit attempts: has quit twice before - once for a month, once for 3 months; he used gum before and could not tolerate the taste -Counseled on nicotine patch; pt is not ready to quit today but will contact office if he wants help   Osteoarthritis (Goal: manage pain) -Current therapy:  . Naproxen 220 mg PRN (knee joint pain) . Turmeric  -Counseled on effects of NSAIDs on kidney function and BP  -Recommend to use Tylenol up to 1000 mg TID rather than naproxen for arthritis pain  Health Maintenance -Vaccine  gaps: none -Current therapy:  . Meclizine 25 mg TID prn . Vitamin D 5000 IU daily -Patient is satisfied with current therapy and denies issues -Recommended to continue current medication  Patient Goals/Self-Care Activities . Over the next 365 days, patient will:  - take medications as prescribed focus on medication adherence by pill box target a minimum of 150 minutes of moderate intensity exercise weekly  Use Tylenol instead of Aleve  Follow Up Plan: Telephone follow up appointment with care management team member scheduled for: 1 year      Mr. Huelsmann was given information about Chronic Care Management services today including:  1. CCM service includes personalized support from designated clinical staff supervised by his physician, including individualized plan of care and coordination with other care providers 2. 24/7 contact phone numbers for assistance for urgent and routine care needs. 3. Standard insurance, coinsurance, copays and deductibles apply for chronic care management only during months in which we provide at least 20 minutes of these services. Most insurances cover these services at 100%, however patients may be responsible for any copay, coinsurance and/or deductible if applicable. This service may help you avoid the need for more expensive face-to-face services. 4. Only one practitioner may furnish and bill the service in a calendar month. 5. The patient may stop CCM services at any time (effective at the end of the month) by phone call to the office staff.  Patient agreed to services and verbal consent obtained.   The patient verbalized understanding of instructions, educational materials, and care plan provided today and agreed to receive a mailed copy of patient instructions, educational materials, and care plan.  Telephone follow up appointment with pharmacy team member scheduled for: 1 year  Charlene Brooke, PharmD, BCACP Clinical Pharmacist [facility]  at [facility] [phone]  Osteoarthritis  Osteoarthritis is a type of arthritis. It refers to joint pain or joint disease. Osteoarthritis affects tissue that covers the ends of bones in joints (cartilage). Cartilage acts as a cushion between the bones and helps them move smoothly. Osteoarthritis occurs when cartilage in the joints gets worn down. Osteoarthritis is sometimes called "wear and tear" arthritis. Osteoarthritis is the most common form of arthritis. It often occurs in older people. It is a condition that gets worse over time. The joints most often affected by this condition are in the fingers, toes, hips, knees, and spine, including the neck and lower back. What are the causes? This condition is caused by the wearing down of cartilage that covers the ends of bones. What increases the risk? The following factors may make you more likely to develop this condition:  Being age 76 or older.  Obesity.  Overuse of joints.  Past injury of a joint.  Past surgery on a joint.  Family history of osteoarthritis. What are the signs or symptoms? The main symptoms of this condition are pain, swelling, and stiffness in the joint. Other  symptoms may include:  An enlarged joint.  More pain and further damage caused by small pieces of bone or cartilage that break off and float inside of the joint.  Small deposits of bone (osteophytes) that grow on the edges of the joint.  A grating or scraping feeling inside the joint when you move it.  Popping or creaking sounds when you move.  Difficulty walking or exercising.  An inability to grip items, twist your hand(s), or control the movements of your hands and fingers. How is this diagnosed? This condition may be diagnosed based on:  Your medical history.  A physical exam.  Your symptoms.  X-rays of the affected joint(s).  Blood tests to rule out other types of arthritis. How is this treated? There is no cure for this condition,  but treatment can help control pain and improve joint function. Treatment may include a combination of therapies, such as:  Pain relief techniques, such as: ? Applying heat and cold to the joint. ? Massage. ? A form of talk therapy called cognitive behavioral therapy (CBT). This therapy helps you set goals and follow up on the changes that you make.  Medicines for pain and inflammation. The medicines can be taken by mouth or applied to the skin. They include: ? NSAIDs, such as ibuprofen. ? Prescription medicines. ? Strong anti-inflammatory medicines (corticosteroids). ? Certain nutritional supplements.  A prescribed exercise program. You may work with a physical therapist.  Assistive devices, such as a brace, wrap, splint, specialized glove, or cane.  A weight control plan.  Surgery, such as: ? An osteotomy. This is done to reposition the bones and relieve pain or to remove loose pieces of bone and cartilage. ? Joint replacement surgery. You may need this surgery if you have advanced osteoarthritis. Follow these instructions at home: Activity  Rest your affected joints as told by your health care provider.  Exercise as told by your health care provider. He or she may recommend specific types of exercise, such as: ? Strengthening exercises. These are done to strengthen the muscles that support joints affected by arthritis. ? Aerobic activities. These are exercises, such as brisk walking or water aerobics, that increase your heart rate. ? Range-of-motion activities. These help your joints move more easily. ? Balance and agility exercises. Managing pain, stiffness, and swelling  If directed, apply heat to the affected area as often as told by your health care provider. Use the heat source that your health care provider recommends, such as a moist heat pack or a heating pad. ? If you have a removable assistive device, remove it as told by your health care provider. ? Place a towel  between your skin and the heat source. If your health care provider tells you to keep the assistive device on while you apply heat, place a towel between the assistive device and the heat source. ? Leave the heat on for 20-30 minutes. ? Remove the heat if your skin turns bright red. This is especially important if you are unable to feel pain, heat, or cold. You may have a greater risk of getting burned.  If directed, put ice on the affected area. To do this: ? If you have a removable assistive device, remove it as told by your health care provider. ? Put ice in a plastic bag. ? Place a towel between your skin and the bag. If your health care provider tells you to keep the assistive device on during icing, place a towel between the  assistive device and the bag. ? Leave the ice on for 20 minutes, 2-3 times a day. ? Move your fingers or toes often to reduce stiffness and swelling. ? Raise (elevate) the injured area above the level of your heart while you are sitting or lying down.      General instructions  Take over-the-counter and prescription medicines only as told by your health care provider.  Maintain a healthy weight. Follow instructions from your health care provider for weight control.  Do not use any products that contain nicotine or tobacco, such as cigarettes, e-cigarettes, and chewing tobacco. If you need help quitting, ask your health care provider.  Use assistive devices as told by your health care provider.  Keep all follow-up visits as told by your health care provider. This is important. Where to find more information  [employer] of Arthritis and Musculoskeletal and Skin Diseases: www.niams.SouthExposed.es  [employer] on Aging: http://kim-miller.com/  American College of Rheumatology: www.rheumatology.org Contact a health care provider if:  You have redness, swelling, or a feeling of warmth in a joint that gets worse.  You have a fever along with joint or muscle  aches.  You develop a rash.  You have trouble doing your normal activities. Get help right away if:  You have pain that gets worse and is not relieved by pain medicine. Summary  Osteoarthritis is a type of arthritis that affects tissue covering the ends of bones in joints (cartilage).  This condition is caused by the wearing down of cartilage that covers the ends of bones.  The main symptom of this condition is pain, swelling, and stiffness in the joint.  There is no cure for this condition, but treatment can help control pain and improve joint function. This information is not intended to replace advice given to you by your health care provider. Make sure you discuss any questions you have with your health care provider. Document Revised: 11/23/2019 Document Reviewed: 11/23/2019 Elsevier Patient Education  2021 [employer].

## 2021-01-27 NOTE — Progress Notes (Cosign Needed)
Chronic Care Management Pharmacy Note  01/27/2021 Name:  MANDELA BELLO MRN:  [id number] DOB:  07-03-1946  Subjective: Edward Thornton is an 75 y.o. year old male who is a primary patient of Sharlet Salina Real Cons, MD.  The CCM team was consulted for assistance with disease management and care coordination needs.    Engaged with patient by telephone for initial visit in response to provider referral for pharmacy case management and/or care coordination services.   Consent to Services:  The patient was given the following information about Chronic Care Management services today, agreed to services, and gave verbal consent: 1. CCM service includes personalized support from designated clinical staff supervised by the primary care provider, including individualized plan of care and coordination with other care providers 2. 24/7 contact phone numbers for assistance for urgent and routine care needs. 3. Service will only be billed when office clinical staff spend 20 minutes or more in a month to coordinate care. 4. Only one practitioner may furnish and bill the service in a calendar month. 5.The patient may stop CCM services at any time (effective at the end of the month) by phone call to the office staff. 6. The patient will be responsible for cost sharing (co-pay) of up to 20% of the service fee (after annual deductible is met). Patient agreed to services and consent obtained.  Patient Care Team: Hoyt Koch, MD as PCP - General (Internal Medicine) Rolm Bookbinder, MD as Consulting Physician (Dermatology) Magdalen Spatz, NP as Nurse Practitioner (Pulmonary Disease) Charlton Haws, [facility] as Pharmacist (Pharmacist)  Patient lives at home with his wife. 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   Recent office visits: 11/08/20 Dr Sharlet Salina OV: CPE. Labs stable, no med changes. Advised to quit smoking.  Recent consult visits: 11/21/20 [facility]: routine f/u, no med changes.  Down to smoking 1/2 PPD. Continue annual LDCT screenings.  Hospital visits: None in previous 6 months  Objective:  Lab Results  Component Value Date   CREATININE 0.86 11/08/2020   BUN 14 11/08/2020   GFR 85.78 11/08/2020   NA 139 11/08/2020   K 3.7 11/08/2020   CALCIUM 9.5 11/08/2020   CO2 31 11/08/2020    Lab Results  Component Value Date/Time   HGBA1C 5.8 11/08/2020 10:05 AM   HGBA1C 5.9 09/21/2019 10:01 AM   GFR 85.78 11/08/2020 10:05 AM   GFR 84.99 09/21/2019 10:01 AM   MICROALBUR <0.7 11/08/2020 10:05 AM   MICROALBUR <0.7 09/21/2019 10:01 AM    Last diabetic Eye exam: No results found for: HMDIABEYEEXA  Last diabetic Foot exam: No results found for: HMDIABFOOTEX   Lab Results  Component Value Date   CHOL 145 11/08/2020   HDL 45.80 11/08/2020   LDLCALC 76 11/08/2020   LDLDIRECT 85.0 09/16/2017   TRIG 116.0 11/08/2020   CHOLHDL 3 11/08/2020    Hepatic Function Latest Ref Rng & Units 11/08/2020 09/21/2019 09/19/2018  Total Protein 6.0 - 8.3 g/dL 7.4 7.2 7.5  Albumin 3.5 - 5.2 g/dL 4.3 4.2 4.3  AST 0 - 37 U/L _0 ALT 0 - 53 U/L _1 Alk Phosphatase 39 - 117 U/L 65 64 61  Total Bilirubin 0.2 - 1.2 mg/dL 0.8 0.8 1.0    Lab Results  Component Value Date/Time   TSH 2.02 11/08/2020 10:05 AM   TSH 2.00 09/21/2019 10:01 AM    CBC Latest Ref Rng & Units 11/08/2020 09/21/2019 09/19/2018  WBC  4.0 - 10.5 K/uL 7.2 8.3 10.3  Hemoglobin 13.0 - 17.0 g/dL 16.3 16.1 16.5  Hematocrit 39.0 - 52.0 % 48.6 46.7 47.9  Platelets 150.0 - 400.0 K/uL 284.0 291.0 281.0    No results found for: VD25OH  Clinical ASCVD: No  The 10-year ASCVD risk score Mikey Bussing DC Jr., et al., 2013) is: 26%   Values used to calculate the score:     Age: 54 years     Sex: Male     Is Non-Hispanic African American: No     Diabetic: No     Tobacco smoker: Yes     Systolic Blood Pressure: 517 mmHg     Is BP treated: Yes     HDL Cholesterol: 45.8 mg/dL     Total Cholesterol: 145 mg/dL     Depression screen [facility] 2/9 11/08/2020 09/21/2019 09/19/2018  Decreased Interest 0 0 0  Down, Depressed, Hopeless 0 0 0  PHQ - 2 Score 0 0 0      Social History   Tobacco Use  Smoking Status Current Every Day Smoker  . Packs/day: 1.00  . Years: 50.00  . Pack years: 50.00  . Types: Cigarettes  . Start date: 12/10/1964  Smokeless Tobacco Never Used   BP Readings from Last 3 Encounters:  11/08/20 130/70  11/08/20 130/70  09/21/19 122/80   Pulse Readings from Last 3 Encounters:  11/08/20 66  11/08/20 66  09/21/19 70   Wt Readings from Last 3 Encounters:  11/08/20 183 lb (83 kg)  11/08/20 183 lb 3.2 oz (83.1 kg)  09/21/19 186 lb (84.4 kg)    Assessment/Interventions: Review of patient past medical history, allergies, medications, health status, including review of consultants reports, laboratory and other test data, was performed as part of comprehensive evaluation and provision of chronic care management services.   SDOH:  (Social Determinants of Health) assessments and interventions performed: Yes   CCM Care Plan  No Known Allergies  Medications Reviewed Today    Reviewed by Charlton Haws, [facility] (Pharmacist) on 01/27/21 at 1128  Med List Status: <None>  Medication Order Taking? Sig Documenting Provider Last Dose Status Informant  amLODipine (NORVASC) 10 MG tablet [id number] Yes TAKE 1 TABLET BY MOUTH  DAILY Hoyt Koch, MD Taking Active   aspirin 81 MG tablet [id number] Yes Take 81 mg by mouth daily. [provider] Taking Active   atorvastatin (LIPITOR) 20 MG tablet [id number] Yes TAKE 1 TABLET BY MOUTH  DAILY Hoyt Koch, MD Taking Active   cholecalciferol (VITAMIN D) 1000 units tablet [id number] Yes Take 1 tablet (1,000 Units total) by mouth 2 (two) times daily. Takes 2 tablets twice daily Hoyt Koch, MD Taking Active   hydrochlorothiazide (HYDRODIURIL) 25 MG tablet [id number] Yes TAKE 1 TABLET BY MOUTH  DAILY Hoyt Koch, MD Taking Active   losartan (COZAAR) 100 MG tablet [id number] Yes TAKE 1 TABLET BY MOUTH  DAILY Hoyt Koch, MD Taking Active   meclizine (ANTIVERT) 25 MG tablet [id number] Yes Take 1 tablet (25 mg total) by mouth 3 (three) times daily as needed for dizziness. [name], FNP Taking Active   naproxen sodium (ALEVE) 220 MG tablet [id number] Yes Take 220 mg by mouth as needed. [provider] Taking Active Self  Omega-3 Fatty Acids (FISH OIL) 1200 MG CAPS [id number] Yes Take 1 capsule (1,200 mg total) by mouth daily. Take 1 capsule daily Hoyt Koch, MD Taking Active   Turmeric 500 MG TABS [id number]  Yes Take by mouth. [provider] Taking Active           Patient Active Problem List   Diagnosis Date Noted  . Tobacco abuse 09/19/2018  . Routine general medical examination at a health care facility 08/24/2016  . BPV (benign positional vertigo) 08/02/2016  . Essential hypertension 08/26/2015  . Hyperlipidemia 08/26/2015    Immunization History  Administered Date(s) Administered  . Fluad Quad(high Dose 65+) 08/21/2019  . Influenza, High Dose Seasonal PF 08/24/2016, 09/16/2017, 09/19/2018  . Influenza,inj,Quad PF,6+ Mos 08/23/2015  . Influenza-Unspecified 10/02/2011, 09/14/2013, 09/29/2014, 08/28/2020  . PFIZER(Purple Top)SARS-COV-2 Vaccination 01/15/2020, 02/09/2020, 09/10/2020  . Pneumococcal Conjugate-13 09/16/2017  . Pneumococcal Polysaccharide-23 09/19/2018  . Pneumococcal-Unspecified 06/05/2009  . Td 06/05/2005  . Tdap 12/04/2016  . Zoster 12/10/2008  . Zoster Recombinat (Shingrix) 11/24/2019, 04/11/2020    Conditions to be addressed/monitored:  Hypertension, Hyperlipidemia, Osteoarthritis and Tobacco use  Care Plan : [place]  Updates made by Charlton Haws, [place] since 01/27/2021 12:00 AM    Problem: Hypertension, Hyperlipidemia, Osteoarthritis and Tobacco use   Priority: High    Long-Range Goal: Disease  management   Start Date: 01/27/2021  Expected End Date: 01/27/2022  This Visit's Progress: On track  Priority: High  Note:   Current Barriers:  . Unable to independently monitor therapeutic efficacy . Suboptimal therapeutic regimen for athritis  Pharmacist Clinical Goal(s):  Marland Kitchen Over the next 365 days, patient will achieve adherence to monitoring guidelines and medication adherence to achieve therapeutic efficacy . adhere to plan to optimize therapeutic regimen for arthritis as evidenced by report of adherence to recommended medication management changes through collaboration with PharmD and provider.   Interventions: . 1:1 collaboration with Hoyt Koch, MD regarding development and update of comprehensive plan of care as evidenced by provider attestation and co-signature . Inter-disciplinary care team collaboration (see longitudinal plan of care) . Comprehensive medication review performed; medication list updated in electronic medical record  Hypertension (BP goal <130/80) -controlled -Current treatment: . Amlodipine 10 mg daily AM . HCTZ 25 mg daily AM . Losartan 100 mg daily AM -Current home readings: checks occasionally, typically 120-130/60-70s -Current exercise habits: walks around neighborhood occasionally -Denies hypotensive/hypertensive symptoms -Educated on BP goals and benefits of medications for prevention of heart attack, stroke and kidney damage; Daily salt intake goal < 2300 mg; Exercise goal of 150 minutes per week; -Counseled to monitor BP at home as needed, document, and provide log at future appointments -Recommended to continue current medication  Hyperlipidemia: (LDL goal < 100) -controlled -Current treatment: . Atorvastatin 20 mg daily PM . Aspirin 81 mg daily . Omega-3 FA Fish oil 1200 mg daily -Educated on Cholesterol goals;  Benefits of statin for ASCVD risk reduction; -Recommended to continue current medication  Tobacco use (Goal: quit  smoking) -uncontrolled - smoking 1/2 ppd, down from 1 ppd previously -Patient triggers include: drinking coffee and seeing someone else smoke -On a scale of 1-10, reports MOTIVATION to quit is low -On a scale of 1-10, reports CONFIDENCE in quitting is low -Previous quit attempts: has quit twice before - once for a month, once for 3 months; he used gum before and could not tolerate the taste -Counseled on nicotine patch; pt is not ready to quit today but will contact office if he wants help   Osteoarthritis (Goal: manage pain) -Current therapy:  . Naproxen 220 mg PRN (knee joint pain) . Turmeric  -Counseled on effects of NSAIDs on kidney function and BP  -  Recommend to use Tylenol up to 1000 mg TID rather than naproxen for arthritis pain  Health Maintenance -Vaccine gaps: none -Current therapy:  . Meclizine 25 mg TID prn . Vitamin D 5000 IU daily -Patient is satisfied with current therapy and denies issues -Recommended to continue current medication  Patient Goals/Self-Care Activities . Over the next 365 days, patient will:  - take medications as prescribed focus on medication adherence by pill box target a minimum of 150 minutes of moderate intensity exercise weekly  Use Tylenol instead of Aleve  Follow Up Plan: Telephone follow up appointment with care management team member scheduled for: 1 year      Medication Assistance: None required.  Patient affirms current coverage meets needs.  Patient's preferred pharmacy is:  [id number] [address] [place] [address] Phone: [phone] Fax: [phone]  [facility], [address] [place], [address] [place], [address] [place] [address] Phone: [phone] Fax: [phone]  Uses pill box? Yes Pt endorses 100% compliance  We discussed: Current pharmacy is preferred with insurance plan and  patient is satisfied with pharmacy services Patient decided to: Continue current medication management strategy  Care Plan and Follow Up Patient Decision:  Patient agrees to Care Plan and Follow-up.  Plan: Telephone follow up appointment with care management team member scheduled for:  1 year  Charlene Brooke, PharmD, [facility] Clinical Pharmacist [place] [facility] [phone]

## 2021-01-27 NOTE — Progress Notes (Signed)
I am out of office so needs to be signed by provider working date of service

## 2021-04-13 ENCOUNTER — Encounter: Payer: Self-pay | Admitting: Internal Medicine

## 2021-06-01 ENCOUNTER — Other Ambulatory Visit: Payer: Self-pay

## 2021-06-01 ENCOUNTER — Encounter: Payer: Self-pay | Admitting: Orthopaedic Surgery

## 2021-06-01 ENCOUNTER — Ambulatory Visit: Payer: Self-pay

## 2021-06-01 ENCOUNTER — Ambulatory Visit (INDEPENDENT_AMBULATORY_CARE_PROVIDER_SITE_OTHER): Payer: Medicare Other | Admitting: Orthopaedic Surgery

## 2021-06-01 VITALS — Ht 72.0 in | Wt 183.0 lb

## 2021-06-01 DIAGNOSIS — M170 Bilateral primary osteoarthritis of knee: Secondary | ICD-10-CM | POA: Insufficient documentation

## 2021-06-01 DIAGNOSIS — M1712 Unilateral primary osteoarthritis, left knee: Secondary | ICD-10-CM | POA: Diagnosis not present

## 2021-06-01 DIAGNOSIS — M25562 Pain in left knee: Secondary | ICD-10-CM

## 2021-06-01 DIAGNOSIS — G8929 Other chronic pain: Secondary | ICD-10-CM

## 2021-06-01 MED ORDER — BUPIVACAINE HCL 0.25 % IJ SOLN
2.0000 mL | INTRAMUSCULAR | Status: AC | PRN
  Administered 2021-06-01: 2 mL via INTRA_ARTICULAR

## 2021-06-01 MED ORDER — LIDOCAINE HCL 1 % IJ SOLN
2.0000 mL | INTRAMUSCULAR | Status: AC | PRN
  Administered 2021-06-01: 2 mL

## 2021-06-01 NOTE — Progress Notes (Signed)
Office Visit Note   Patient: Edward Thornton           Date of Birth: December 18, 1945           MRN: [id number] Visit Date: 06/01/2021              Requested by: Edward Koch, MD [address] [place],  [place] [address] PCP: Edward Koch, MD   Assessment & Plan: Visit Diagnoses:  1. 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  Long discussion regarding his diagnosis and treatment options.  We discussed NSAID, supports, injections,.  Also discussed exercise regimen.  With an intra-articular cortisone injection and monitor response.  Might be a candidate for viscosupplementation.  He will let me know  Follow-Up Instructions: No follow-ups on file.   Orders:  Orders Placed This Encounter  Procedures   XR KNEE 3 VIEW LEFT   No orders of the defined types were placed in this encounter.     Procedures: Large Joint Inj: L knee on 06/01/2021 4:20 PM Indications: pain and diagnostic evaluation Details: 25 G 1.5 in needle, anteromedial approach  Arthrogram: No  Medications: 2 mL lidocaine 1 %; 2 mL bupivacaine 0.25 %  12 mg betamethasone injected into the medial compartment left knee with Xylocaine and Marcaine Procedure, treatment alternatives, risks and benefits explained, specific risks discussed. Consent was given by the patient. Patient was prepped and draped in the usual sterile fashion.      Clinical Data: No additional findings.   Subjective: Chief Complaint  Patient presents with   Left Knee - Pain  Patient presents today for left knee pain. He said that it has hurt him for a year, but worsened in the last 84months. He has difficulty with weightbearing and in the mornings. No swelling or grinding. He said that he cannot do long walks for exercise like he use to. He has tried voltaren gel but that does not help.   HPI  Review of Systems   Objective: Vital Signs: There were no vitals taken for this  visit.  Physical Exam Constitutional:      Appearance: He is well-developed.  Eyes:     Pupils: Pupils are equal, round, and reactive to light.  Pulmonary:     Effort: Pulmonary effort is normal.  Skin:    General: Skin is warm and dry.  Neurological:     Mental Status: He is alert and oriented to person, place, and time.  Psychiatric:        Behavior: Behavior normal.    Ortho Exam left knee was not hot red warm or swollen.  No effusion.  Lacked a few degrees to full extension but flex at least 105 degrees without instability.  There were some hypertrophic changes along the medial compartment with increased varus.  Mild patella crepitation but no pain with compression.  No pain laterally.  No popliteal pain or mass.  No calf pain.  Motor exam intact.  Straight leg raise negative.  Painless range of motion both hips  Specialty Comments:  No specialty comments available.  Imaging: No results found.   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   Hyperlipidemia    Hypertension     Family History  Problem Relation Age of Onset   Stroke Mother    Heart disease Mother    Stroke Father    Heart disease Father    Colon cancer Neg Hx    Stomach cancer Neg Hx     Past Surgical History:  Procedure Laterality Date   adominal hernia  1990   HERNIA REPAIR Left 12/10/1997   KNEE ARTHROSCOPY  1990   right   KNEE ARTHROSCOPY  2010   left   PILONIDAL CYST EXCISION  1980   Social History   Occupational History   Occupation: Insurance account manager, part-time/ retired  Tobacco Use   Smoking status: Every Day    Packs/day: 1.00    Years: 50.00    Pack years:  50.00    Types: Cigarettes    Start date: 12/10/1964   Smokeless tobacco: Never  Vaping Use   Vaping Use: Never used  Substance and Sexual Activity   Alcohol use: Yes    Alcohol/week: 3.0 standard drinks    Types: 3 Cans of beer per week    Comment: drinks beer; 3 or 4 a week    Drug use: No   Sexual activity: Yes

## 2021-06-24 IMAGING — CT CT CHEST LUNG CANCER SCREENING LOW DOSE W/O CM
2 of 4 series · 15 of 36 positions shown, 18 images · non-contrast
Comparison: Low-dose lung cancer screening chest CT 11/13/2019.

CLINICAL DATA: 74-year-old male current smoker with 53 pack-year
history of smoking. Lung cancer screening examination.

EXAM:
CT CHEST WITHOUT CONTRAST LOW-DOSE FOR LUNG CANCER SCREENING
TECHNIQUE: Multidetector CT imaging of the chest was performed following the
standard protocol without IV contrast.

[Series 3: lung thins 1.0 · axial · 0.80mm/px · z∈[-296,+7]mm · 12 of 335 slices shown, 15 images]
[im 16/335  mediastinal]
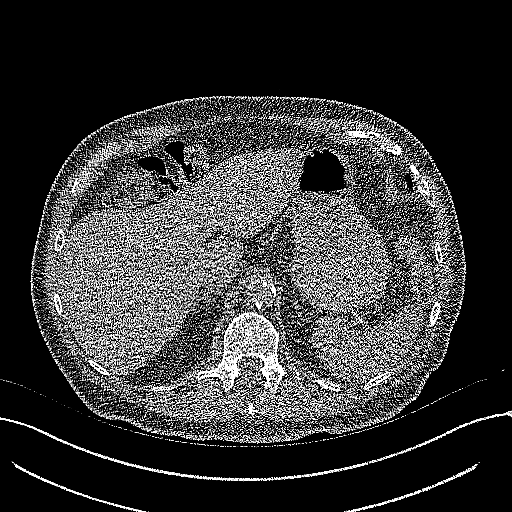
[im 16/335  lung]
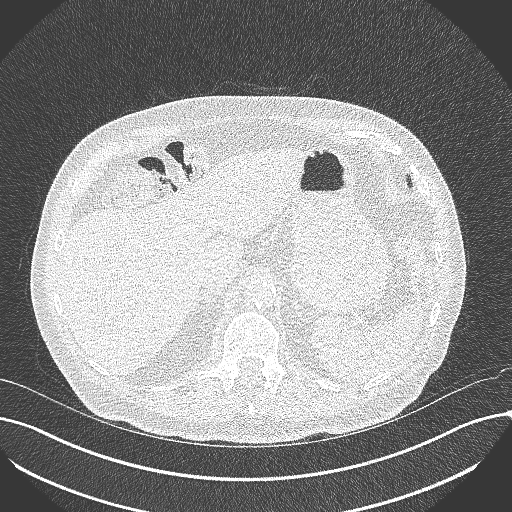
[im 46/335  lung]
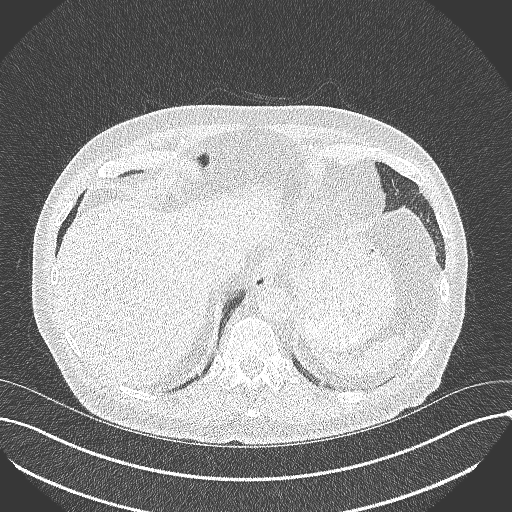
[im 76/335  lung]
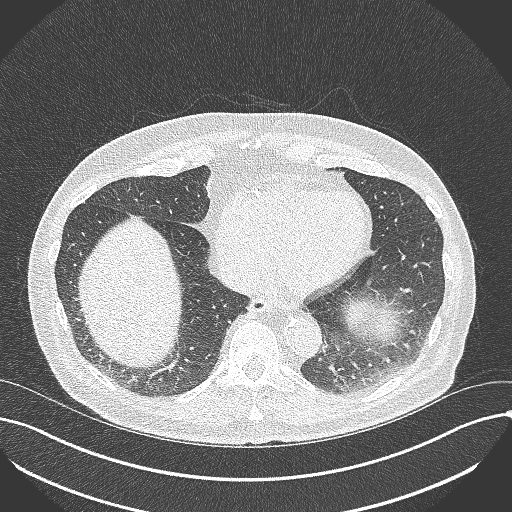
[im 107/335  lung]
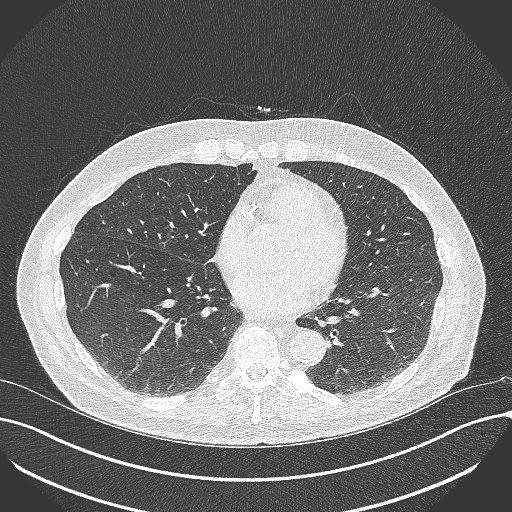
[im 122/335  mediastinal]
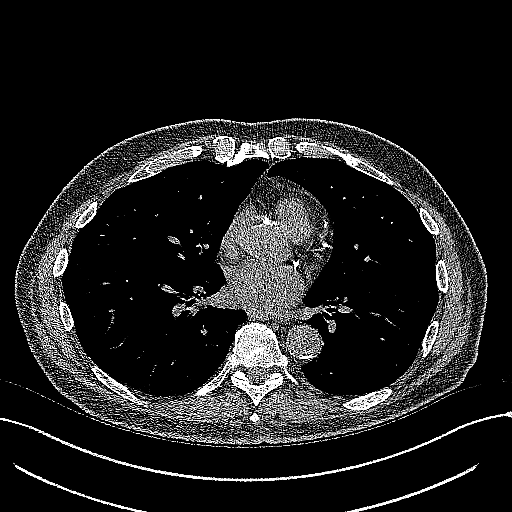
[im 122/335  lung]
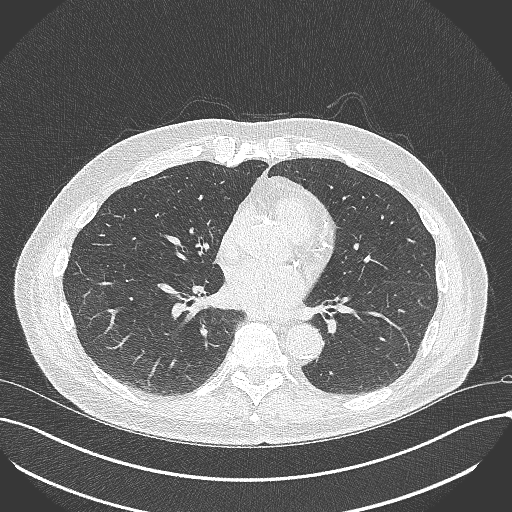
[im 152/335  lung]
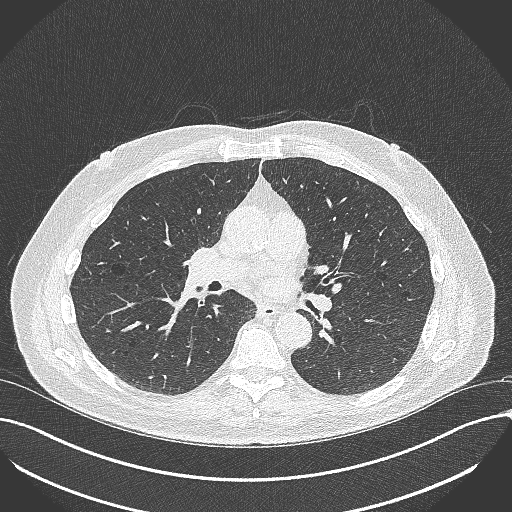
[im 183/335  lung]
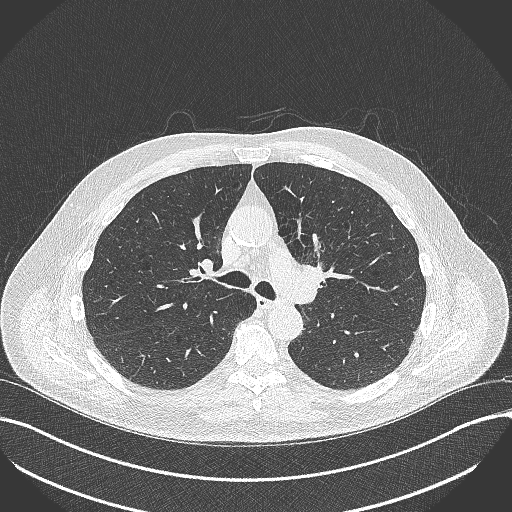
[im 213/335  lung]
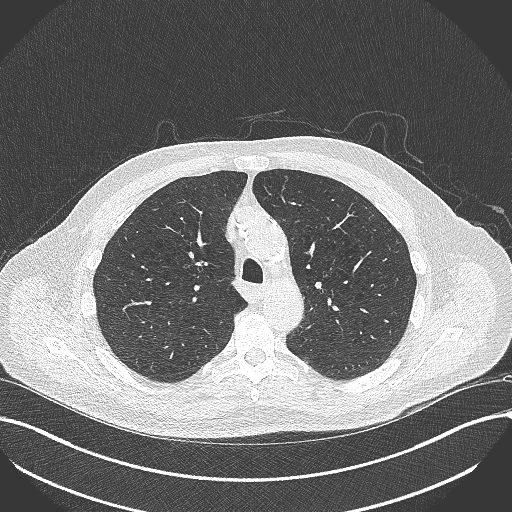
[im 228/335  mediastinal]
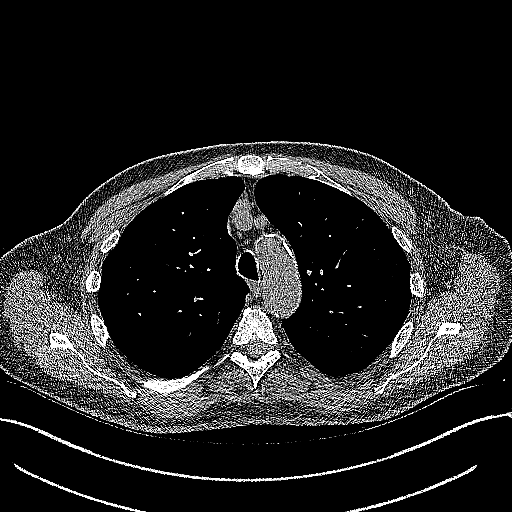
[im 228/335  lung]
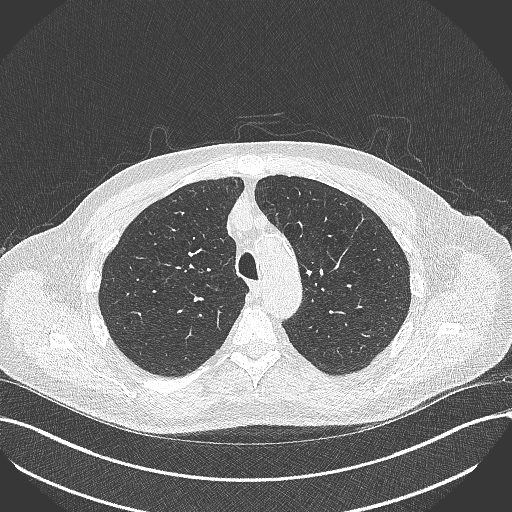
[im 259/335  lung]
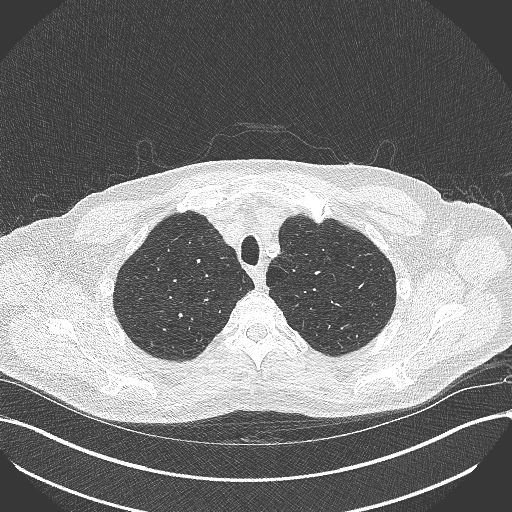
[im 289/335  lung]
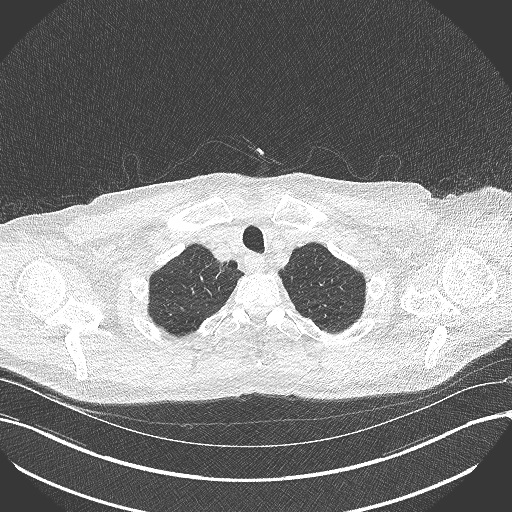
[im 319/335  lung]
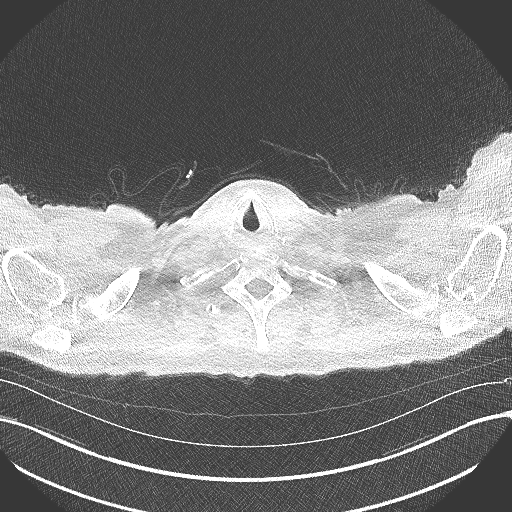

[Series 5: coronal · coronal · 0.65mm/px · 3 of 130 slices shown]
[im 26/130  lung]
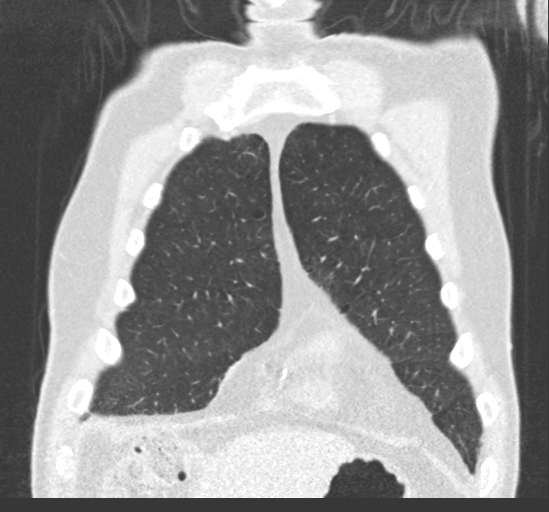
[im 52/130  lung]
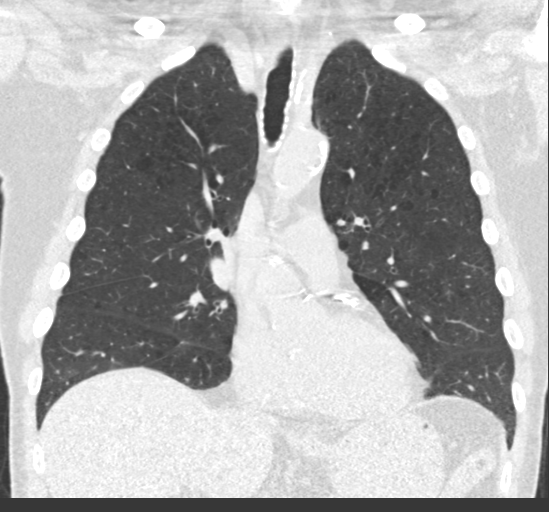
[im 78/130  lung]
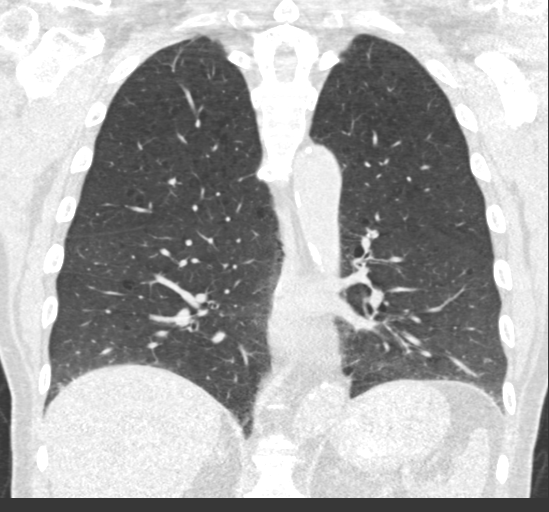

[15 of 36 positions shown; findings below may reference images not displayed]

FINDINGS: Cardiovascular: Heart size is normal. There is no significant
pericardial fluid, thickening or pericardial calcification. There is
aortic atherosclerosis, as well as atherosclerosis of the great
vessels of the mediastinum and the coronary arteries, including
calcified atherosclerotic plaque in the left main, left anterior
descending, left circumflex and right coronary arteries.
Calcifications of the aortic valve.

Mediastinum/Nodes: No pathologically enlarged mediastinal or hilar
lymph nodes. Please note that accurate exclusion of hilar adenopathy
is limited on noncontrast CT scans. Esophagus is unremarkable in
appearance. No axillary lymphadenopathy.

Lungs/Pleura: No suspicious appearing pulmonary nodules or masses
are noted. No acute consolidative airspace disease. No pleural
effusions. Diffuse bronchial wall thickening with mild centrilobular
and paraseptal emphysema.

Upper Abdomen: Aortic atherosclerosis. Subcentimeter low-attenuation
lesion in segment 2 of the liver, incompletely characterized on
today's non-contrast CT examination, but similar to the prior study
and statistically likely to represent a cyst.

Musculoskeletal: There are no aggressive appearing lytic or blastic
lesions noted in the visualized portions of the skeleton.
IMPRESSION: 1. Lung-RADS 1S, negative. Continue annual screening with low-dose
chest CT without contrast in 12 months.
2. The "S" modifier above refers to potentially clinically
significant non lung cancer related findings. Specifically, there is
aortic atherosclerosis, in addition to left main and 3 vessel
coronary artery disease. Please note that although the presence of
coronary artery calcium documents the presence of coronary artery
disease, the severity of this disease and any potential stenosis
cannot be assessed on this non-gated CT examination. Assessment for
potential risk factor modification, dietary therapy or pharmacologic
therapy may be warranted, if clinically indicated.
3. Mild diffuse bronchial wall thickening with mild centrilobular
and paraseptal emphysema; imaging findings suggestive of underlying
COPD.
4. There are calcifications of the aortic valve. Echocardiographic
correlation for evaluation of potential valvular dysfunction may be
warranted if clinically indicated.

Aortic Atherosclerosis (24SAF-GWP.P) and Emphysema (24SAF-KGF.Z).

## 2021-06-26 DIAGNOSIS — H353112 Nonexudative age-related macular degeneration, right eye, intermediate dry stage: Secondary | ICD-10-CM | POA: Diagnosis not present

## 2021-06-26 DIAGNOSIS — Z961 Presence of intraocular lens: Secondary | ICD-10-CM | POA: Diagnosis not present

## 2021-06-26 DIAGNOSIS — H52203 Unspecified astigmatism, bilateral: Secondary | ICD-10-CM | POA: Diagnosis not present

## 2021-07-19 ENCOUNTER — Telehealth: Payer: Self-pay | Admitting: Pharmacist

## 2021-07-19 NOTE — Progress Notes (Signed)
    Chronic Care Management Pharmacy Assistant   Name: Edward Thornton  MRN: [id number] DOB: 1946/07/22   Reason for Encounter: Disease State   Conditions to be addressed/monitored: General   Recent office visits:  None ID  Recent consult visits:  06/01/21 Dr. Joni Fears, Orthopedics Reason:Chronic pain of left knee Orders:XR Knee 3 view left Large joint [id number] knee Medications:2 mL lidocaine 1 %; 2 mL bupivacaine 0.25 % 12 mg betamethasone injected into the medial compartment left knee with Xylocaine and Marcaine  Hospital visits:  None in previous 6 months  Medications: Outpatient Encounter Medications as of 07/19/2021  Medication Sig   amLODipine (NORVASC) 10 MG tablet TAKE 1 TABLET BY MOUTH  DAILY   aspirin 81 MG tablet Take 81 mg by mouth daily.   atorvastatin (LIPITOR) 20 MG tablet TAKE 1 TABLET BY MOUTH  DAILY   cholecalciferol (VITAMIN D) 1000 units tablet Take 1 tablet (1,000 Units total) by mouth 2 (two) times daily. Takes 2 tablets twice daily   hydrochlorothiazide (HYDRODIURIL) 25 MG tablet TAKE 1 TABLET BY MOUTH  DAILY   losartan (COZAAR) 100 MG tablet TAKE 1 TABLET BY MOUTH  DAILY   meclizine (ANTIVERT) 25 MG tablet Take 1 tablet (25 mg total) by mouth 3 (three) times daily as needed for dizziness.   naproxen sodium (ALEVE) 220 MG tablet Take 220 mg by mouth as needed.   Omega-3 Fatty Acids (FISH OIL) 1200 MG CAPS Take 1 capsule (1,200 mg total) by mouth daily. Take 1 capsule daily   Turmeric 500 MG TABS Take by mouth.   No facility-administered encounter medications on file as of 07/19/2021.   Pharmacist Review Have you had any problems recently with your health? Patient states that his only issue is his knee right now. He last saw Dr. Durward Fortes and had a knee injection which is working well for him. He stated that they discuss having knee replacement if the pain becomes unbearable.  Have you had any problems with your pharmacy? Patient states that he is  not having any problems with getting medication or the cost of medication from the pharmacy   What issues or side effects are you having with your medications? Patient states that he does not have any side effects from any medications  What would you like me to pass along to [facility] for them to help you with?  Patient states that he is doing well and only concern right now is his health  What can we do to take care of you better?  Patient states not at this time  Star Rating Drugs: Losartan 100 mg last fill 05/15/21 90 ds Atorvastatin 20 mg last fill 05/15/21 90 ds  Ethelene Hal Clinical Pharmacist Assistant [phone]   Time spent:29

## 2021-08-10 DIAGNOSIS — I639 Cerebral infarction, unspecified: Secondary | ICD-10-CM

## 2021-08-10 HISTORY — DX: Cerebral infarction, unspecified: I63.9

## 2021-08-20 ENCOUNTER — Emergency Department (HOSPITAL_COMMUNITY): Payer: Medicare Other

## 2021-08-20 ENCOUNTER — Inpatient Hospital Stay (HOSPITAL_COMMUNITY)
Admission: EM | Admit: 2021-08-20 | Discharge: 2021-08-22 | DRG: 062 | Disposition: A | Discharge status: Home / Self Care | Payer: Medicare Other | Attending: Neurology | Admitting: Neurology

## 2021-08-20 DIAGNOSIS — R29810 Facial weakness: Secondary | ICD-10-CM | POA: Diagnosis present

## 2021-08-20 DIAGNOSIS — R202 Paresthesia of skin: Secondary | ICD-10-CM | POA: Diagnosis present

## 2021-08-20 DIAGNOSIS — I10 Essential (primary) hypertension: Secondary | ICD-10-CM | POA: Diagnosis not present

## 2021-08-20 DIAGNOSIS — R609 Edema, unspecified: Secondary | ICD-10-CM | POA: Diagnosis not present

## 2021-08-20 DIAGNOSIS — C8400 Mycosis fungoides, unspecified site: Secondary | ICD-10-CM | POA: Diagnosis present

## 2021-08-20 DIAGNOSIS — R471 Dysarthria and anarthria: Secondary | ICD-10-CM | POA: Diagnosis not present

## 2021-08-20 DIAGNOSIS — G459 Transient cerebral ischemic attack, unspecified: Principal | ICD-10-CM | POA: Diagnosis present

## 2021-08-20 DIAGNOSIS — R4781 Slurred speech: Secondary | ICD-10-CM | POA: Diagnosis not present

## 2021-08-20 DIAGNOSIS — Z79899 Other long term (current) drug therapy: Secondary | ICD-10-CM

## 2021-08-20 DIAGNOSIS — R7303 Prediabetes: Secondary | ICD-10-CM | POA: Diagnosis not present

## 2021-08-20 DIAGNOSIS — I6389 Other cerebral infarction: Secondary | ICD-10-CM | POA: Diagnosis not present

## 2021-08-20 DIAGNOSIS — I63233 Cerebral infarction due to unspecified occlusion or stenosis of bilateral carotid arteries: Secondary | ICD-10-CM | POA: Diagnosis not present

## 2021-08-20 DIAGNOSIS — I161 Hypertensive emergency: Secondary | ICD-10-CM | POA: Diagnosis present

## 2021-08-20 DIAGNOSIS — R6889 Other general symptoms and signs: Secondary | ICD-10-CM | POA: Diagnosis not present

## 2021-08-20 DIAGNOSIS — Z743 Need for continuous supervision: Secondary | ICD-10-CM | POA: Diagnosis not present

## 2021-08-20 DIAGNOSIS — R29703 NIHSS score 3: Secondary | ICD-10-CM | POA: Diagnosis not present

## 2021-08-20 DIAGNOSIS — M47812 Spondylosis without myelopathy or radiculopathy, cervical region: Secondary | ICD-10-CM | POA: Diagnosis not present

## 2021-08-20 DIAGNOSIS — Z823 Family history of stroke: Secondary | ICD-10-CM

## 2021-08-20 DIAGNOSIS — M170 Bilateral primary osteoarthritis of knee: Secondary | ICD-10-CM | POA: Diagnosis present

## 2021-08-20 DIAGNOSIS — Z8673 Personal history of transient ischemic attack (TIA), and cerebral infarction without residual deficits: Secondary | ICD-10-CM | POA: Diagnosis present

## 2021-08-20 DIAGNOSIS — I639 Cerebral infarction, unspecified: Secondary | ICD-10-CM

## 2021-08-20 DIAGNOSIS — Z23 Encounter for immunization: Secondary | ICD-10-CM | POA: Diagnosis not present

## 2021-08-20 DIAGNOSIS — I672 Cerebral atherosclerosis: Secondary | ICD-10-CM | POA: Diagnosis not present

## 2021-08-20 DIAGNOSIS — Z8249 Family history of ischemic heart disease and other diseases of the circulatory system: Secondary | ICD-10-CM

## 2021-08-20 DIAGNOSIS — F17210 Nicotine dependence, cigarettes, uncomplicated: Secondary | ICD-10-CM | POA: Diagnosis present

## 2021-08-20 DIAGNOSIS — Z7982 Long term (current) use of aspirin: Secondary | ICD-10-CM | POA: Diagnosis not present

## 2021-08-20 DIAGNOSIS — Z20822 Contact with and (suspected) exposure to covid-19: Secondary | ICD-10-CM | POA: Diagnosis not present

## 2021-08-20 DIAGNOSIS — J439 Emphysema, unspecified: Secondary | ICD-10-CM | POA: Diagnosis not present

## 2021-08-20 DIAGNOSIS — E785 Hyperlipidemia, unspecified: Secondary | ICD-10-CM | POA: Diagnosis not present

## 2021-08-20 DIAGNOSIS — R0902 Hypoxemia: Secondary | ICD-10-CM | POA: Diagnosis not present

## 2021-08-20 LAB — CBC
HCT: 47.6 % (ref 39.0–52.0)
Hemoglobin: 16.2 g/dL (ref 13.0–17.0)
MCH: 32.4 pg (ref 26.0–34.0)
MCHC: 34 g/dL (ref 30.0–36.0)
MCV: 95.2 fL (ref 80.0–100.0)
Platelets: 257 10*3/uL (ref 150–400)
RBC: 5 MIL/uL (ref 4.22–5.81)
RDW: 12.5 % (ref 11.5–15.5)
WBC: 8.9 10*3/uL (ref 4.0–10.5)
nRBC: 0 % (ref 0.0–0.2)

## 2021-08-20 LAB — DIFFERENTIAL
Abs Immature Granulocytes: 0.02 10*3/uL (ref 0.00–0.07)
Basophils Absolute: 0.1 10*3/uL (ref 0.0–0.1)
Basophils Relative: 1 %
Eosinophils Absolute: 0.3 10*3/uL (ref 0.0–0.5)
Eosinophils Relative: 3 %
Immature Granulocytes: 0 %
Lymphocytes Relative: 32 %
Lymphs Abs: 2.8 10*3/uL (ref 0.7–4.0)
Monocytes Absolute: 0.8 10*3/uL (ref 0.1–1.0)
Monocytes Relative: 9 %
Neutro Abs: 4.9 10*3/uL (ref 1.7–7.7)
Neutrophils Relative %: 55 %

## 2021-08-20 LAB — COMPREHENSIVE METABOLIC PANEL
ALT: 13 U/L (ref 0–44)
AST: 17 U/L (ref 15–41)
Albumin: 3.7 g/dL (ref 3.5–5.0)
Alkaline Phosphatase: 63 U/L (ref 38–126)
Anion gap: 11 (ref 5–15)
BUN: 16 mg/dL (ref 8–23)
CO2: 22 mmol/L (ref 22–32)
Calcium: 9.5 mg/dL (ref 8.9–10.3)
Chloride: 103 mmol/L (ref 98–111)
Creatinine, Ser: 0.89 mg/dL (ref 0.61–1.24)
GFR, Estimated: >60 mL/min (ref >60)
Glucose, Bld: 107 mg/dL — ABNORMAL HIGH (ref 70–99)
Potassium: 3.4 mmol/L — ABNORMAL LOW (ref 3.5–5.1)
Sodium: 136 mmol/L (ref 135–145)
Total Bilirubin: 1 mg/dL (ref 0.3–1.2)
Total Protein: 6.8 g/dL (ref 6.5–8.1)

## 2021-08-20 LAB — I-STAT CHEM 8, ED
BUN: 18 mg/dL (ref 8–23)
Calcium, Ion: 1.12 mmol/L — ABNORMAL LOW (ref 1.15–1.40)
Chloride: 103 mmol/L (ref 98–111)
Creatinine, Ser: 0.9 mg/dL (ref 0.61–1.24)
Glucose, Bld: 105 mg/dL — ABNORMAL HIGH (ref 70–99)
HCT: 47 % (ref 39.0–52.0)
Hemoglobin: 16 g/dL (ref 13.0–17.0)
Potassium: 3.2 mmol/L — ABNORMAL LOW (ref 3.5–5.1)
Sodium: 138 mmol/L (ref 135–145)
TCO2: 24 mmol/L (ref 22–32)

## 2021-08-20 LAB — APTT: aPTT: 31 seconds (ref 24–36)

## 2021-08-20 LAB — ETHANOL: Alcohol, Ethyl (B): <10 mg/dL (ref <10)

## 2021-08-20 LAB — PROTIME-INR
INR: 1 (ref 0.8–1.2)
Prothrombin Time: 13 seconds (ref 11.4–15.2)

## 2021-08-20 MED ORDER — SODIUM CHLORIDE 0.9 % IV SOLN: INTRAVENOUS | Status: DC

## 2021-08-20 MED ORDER — IOHEXOL 350 MG/ML SOLN
75.0000 mL | Freq: Once | INTRAVENOUS | Status: AC | PRN
  Administered 2021-08-20: 75 mL via INTRAVENOUS

## 2021-08-20 MED ORDER — ACETAMINOPHEN 325 MG PO TABS: 650.0000 mg | ORAL_TABLET | ORAL | Status: DC | PRN

## 2021-08-20 MED ORDER — ACETAMINOPHEN 160 MG/5ML PO SOLN: 650.0000 mg | ORAL | Status: DC | PRN

## 2021-08-20 MED ORDER — TENECTEPLASE FOR STROKE
0.2500 mg/kg | PACK | Freq: Once | INTRAVENOUS | Status: AC
  Administered 2021-08-20: 21 mg via INTRAVENOUS
  Filled 2021-08-20: qty 4.2

## 2021-08-20 MED ORDER — ACETAMINOPHEN 650 MG RE SUPP: 650.0000 mg | RECTAL | Status: DC | PRN

## 2021-08-20 MED ORDER — SENNOSIDES-DOCUSATE SODIUM 8.6-50 MG PO TABS: 1.0000 | ORAL_TABLET | Freq: Every evening | ORAL | Status: DC | PRN

## 2021-08-20 MED ORDER — STROKE: EARLY STAGES OF RECOVERY BOOK
Freq: Once | Status: AC
  Filled 2021-08-20: qty 1

## 2021-08-20 MED ORDER — PANTOPRAZOLE SODIUM 40 MG IV SOLR
40.0000 mg | Freq: Every day | INTRAVENOUS | Status: DC
  Administered 2021-08-20 – 2021-08-21 (×2): 40 mg via INTRAVENOUS
  Filled 2021-08-20 (×2): qty 40

## 2021-08-20 MED ORDER — CLEVIDIPINE BUTYRATE 0.5 MG/ML IV EMUL
0.0000 mg/h | INTRAVENOUS | Status: DC
  Filled 2021-08-20: qty 50

## 2021-08-20 MED ORDER — LABETALOL HCL 5 MG/ML IV SOLN
20.0000 mg | Freq: Once | INTRAVENOUS | Status: DC
  Filled 2021-08-20: qty 4

## 2021-08-20 NOTE — Plan of Care (Signed)
Low NIH stroke scale.  Will evaluate at around 2 hours post thrombolysis for possible enrollment in [facility]

## 2021-08-20 NOTE — ED Triage Notes (Signed)
Pt bib EMS for L sided onset of weakness/numbness of his face. LKW 2138 today. No hx stroke, has hx of HTN, hyperlipidemia, and smoking.

## 2021-08-20 NOTE — Code Documentation (Signed)
Stroke Response Nurse Documentation Code Documentation  Edward Thornton is a 75 y.o. male arriving to [facility] ED via [facility] on 9/11 with past medical hx of HTN, HLD, tobacco. On No antithrombotic. Code stroke was activated by EMS.   Patient from home where he was LKW at [phone] and now complaining of left facial droop and slurred speech .   Stroke team at the bedside on patient arrival. Labs drawn and patient cleared for CT by Dr. Langston Masker. Patient to CT with team. NIHSS 3, see documentation for details and code stroke times. Patient with left facial droop, left decreased sensation, and dysarthria  on exam. The following imaging was completed:  CT, CTA head and neck. Patient is a candidate for IV Thrombolytic due to fixed deficits. Patient is not a candidate for IR due to No LVO.    Bedside handoff with ED RN Sofie.    Madelynn Done  Rapid Response RN

## 2021-08-20 NOTE — H&P (Signed)
H&P  CC: left facial droop  History is obtained from:pt, chart, ems  HPI: Edward Thornton is a 75 y.o. male who has a past medical history of hypertension, hyperlipidemia, arthritis and tobacco abuse, was in usual state of health at 9:40 PM when he had a sudden onset of left-sided facial numbness followed by left-sided facial droop and some difficulty with his speech. He describes that he was sitting after dinner at home and suddenly noticed that a sensation like Novocain that you get at the dentist came about on the lower part of his left face.  It also felt like his left face was not acting normally as it would.  He did not initially complain of any speech difficulty but upon having him talk and repeat sentences, it did appear that his speech was somewhat slurred to. His upper face was preserved. Neurological exam performed at the bridge with an NIH of 3. Risks and benefits of IV tPA discussed. EMS blood pressure 184/84. Repeat blood pressure within parameters for IV thrombolysis and IV thrombolysis initiated at 2237 with TNKase.   LKW: 9:40 PM TNKase given?: Yes - 2237 hrs Premorbid modified Rankin scale (mRS):0   ROS: Full ROS was performed and is negative except as noted in the HPI.  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 He started smoking about 56 years ago. He has a 50.00 pack-year smoking history. He has never used smokeless tobacco. He reports current alcohol use of about 3.0 standard drinks per week. He reports that he does not use drugs.  Medications  Current Facility-Administered Medications:     stroke: mapping our early stages of  recovery book, , Does not apply, Once, Amie Portland, MD   0.9 %  sodium chloride infusion, , Intravenous, Continuous, Amie Portland, MD   acetaminophen (TYLENOL) tablet 650 mg, 650 mg, Oral, Q4H PRN **OR** acetaminophen (TYLENOL) 160 MG/5ML solution 650 mg, 650 mg, Per Tube, Q4H PRN **OR** acetaminophen (TYLENOL) suppository 650 mg, 650 mg, Rectal, Q4H PRN, Amie Portland, MD   labetalol (NORMODYNE) injection 20 mg, 20 mg, Intravenous, Once **AND** clevidipine (CLEVIPREX) infusion 0.5 mg/mL, 0-21 mg/hr, Intravenous, Continuous, Amie Portland, MD   pantoprazole (PROTONIX) injection 40 mg, 40 mg, Intravenous, QHS, Amie Portland, MD   senna-docusate (Senokot-S) tablet 1 tablet, 1 tablet, Oral, QHS PRN, Amie Portland, MD  Current Outpatient Medications:    amLODipine (NORVASC) 10 MG tablet, TAKE 1 TABLET BY MOUTH  DAILY, Disp: 90 tablet, Rfl: 3   aspirin 81 MG tablet, Take 81 mg by mouth daily., Disp: , Rfl:    atorvastatin (LIPITOR) 20 MG tablet, TAKE 1 TABLET BY MOUTH  DAILY, Disp: 90 tablet, Rfl: 3   cholecalciferol (VITAMIN D) 1000 units tablet, Take 1 tablet (1,000 Units total) by mouth 2 (two) times daily. Takes 2 tablets twice daily, Disp: 180 tablet, Rfl: 2   hydrochlorothiazide (HYDRODIURIL) 25 MG tablet, TAKE 1 TABLET BY MOUTH  DAILY, Disp: 90 tablet, Rfl: 3   losartan (COZAAR) 100 MG tablet, TAKE 1  TABLET BY MOUTH  DAILY, Disp: 90 tablet, Rfl: 3   meclizine (ANTIVERT) 25 MG tablet, Take 1 tablet (25 mg total) by mouth 3 (three) times daily as needed for dizziness., Disp: 30 tablet, Rfl: 0   naproxen sodium (ALEVE) 220 MG tablet, Take 220 mg by mouth as needed., Disp: , Rfl:    Omega-3 Fatty Acids (FISH OIL) 1200 MG CAPS, Take 1 capsule (1,200 mg total) by mouth daily. Take 1 capsule daily, Disp: 90 capsule, Rfl: 3   Turmeric 500 MG TABS, Take by mouth., Disp: , Rfl:    Exam: Current vital signs: BP (!) 155/66   Pulse 66   Resp 17   Wt 82.4 kg   SpO2 96%   BMI 24.64 kg/m  Vital  signs in last 24 hours: Pulse Rate:  [66-73] 66 (09/11 2300) Resp:  [16-18] 17 (09/11 2300) BP: (152-162)/(65-68) 155/66 (09/11 2300) SpO2:  [95 %-96 %] 96 % (09/11 2300) Weight:  [82.4 kg] 82.4 kg (09/11 2200)  GENERAL: Awake, alert in NAD HEENT: - Normocephalic and atraumatic, dry mm, no LN++, no Thyromegally LUNGS - Clear to auscultation bilaterally with no wheezes CV - S1S2 RRR, no m/r/g, equal pulses bilaterally. ABDOMEN - Soft, nontender, nondistended with normoactive BS Ext: warm, well perfused, intact peripheral pulses, no edema  NEURO:  Mental Status: AA&Ox3 Language: speech is mildly dysarthric, naming, repetition, fluency, and comprehension intact. Cranial Nerves: PERRL. EOMI, visual fields full, no facial asymmetry, facial sensation intact, hearing intact, tongue/uvula/soft palate midline, normal sternocleidomastoid and trapezius muscle strength. No evidence of tongue atrophy or fibrillations Motor: 5/5 in all fours without drift Tone: is normal and bulk is normal Sensation- Intact to light touch bilaterally with the exception of mild diminished light sensation on the left cheek Coordination: FTN intact bilaterally, no ataxia in BLE. Gait- deferred  NIHSS-3 on arrival 2221 hrs.   Labs I have reviewed labs in epic and the results pertinent to this consultation are:   CBC    Component Value Date/Time   WBC 8.9 08/20/2021 2224   RBC 5.00 08/20/2021 2224   HGB 16.0 08/20/2021 2229   HCT 47.0 08/20/2021 2229   PLT 257 08/20/2021 2224   MCV 95.2 08/20/2021 2224   MCH 32.4 08/20/2021 2224   MCHC 34.0 08/20/2021 2224   RDW 12.5 08/20/2021 2224   LYMPHSABS 2.8 08/20/2021 2224   MONOABS 0.8 08/20/2021 2224   EOSABS 0.3 08/20/2021 2224   BASOSABS 0.1 08/20/2021 2224    CMP     Component Value Date/Time   NA 138 08/20/2021 2229   K 3.2 (L) 08/20/2021 2229   CL 103 08/20/2021 2229   CO2 31 11/08/2020 1005   GLUCOSE 105 (H) 08/20/2021 2229   BUN 18 08/20/2021  2229   CREATININE 0.90 08/20/2021 2229   CALCIUM 9.5 11/08/2020 1005   PROT 7.4 11/08/2020 1005   ALBUMIN 4.3 11/08/2020 1005   AST 14 11/08/2020 1005   ALT 13 11/08/2020 1005   ALKPHOS 65 11/08/2020 1005   BILITOT 0.8 11/08/2020 1005      Imaging I have reviewed the images obtained:  CT-head-aspects 10.  No bleed CTA head and neck-negative for emergent LVO   Assessment: 75 year old male with past medical history of hypertension, hyperlipidemia, tobacco abuse with sudden onset of left-sided facial numbness and weakness and on examination has left-sided lower facial weakness consistent with a upper motor neuron type weakness consistent with possibly a small vessel etiology stroke given his risk factors. Risk benefits  of IV TNKase discussed.  Patient agreed to proceed. TNK administered at 2237 hrs. NIH 3   Plan: Acute Ischemic Stroke  Acuity: Acute Current Suspected Etiology: Under investigation Continue Evaluation:  -Admit to: Neuro ICU -Hold Aspirin until 24 hour post IV thrombolysis (tPA or TNKase) neuroimaging is stable and without evidence of bleeding -Blood pressure control, goal of SYS <180 -MRI/ECHO/A1C/Lipid panel. -Hyperglycemia management per SSI to maintain glucose 140-'180mg'$ /dL. -PT/OT/ST therapies and recommendations when able  CNS -Close neuro monitoring  Dysarthria -NPO until cleared by speech -ST -Advance diet as tolerated -PT/OT    RESP No active issues Monitor clinically  CV Hypertensive Emergency-arrived with systolic blood pressure [id number] that resolved without medication administration -Aggressive BP control, goal SBP <180-post IV thrombolysis for stroke -Use labetalol and hydralazine as needed.  If need for drip, Cleviprex should be used. -Transthoracic echo  Hyperlipidemia, unspecified  - Statin for goal LDL < 70  HEME No active issues A.m. labs to include CBC   ENDO Check A1c Goal A1c less than 7  GI/GU No active  issues Gentle hydration  Fluid/Electrolyte Disorders Hypokalemia Replete potassium  ID No active issues.  No leukocytosis. Check chest x-ray   prophylaxis DVT: SCDs GI: PPI Bowel: Docusate senna  Diet: NPO until cleared by speech/bedside swallow screen  Code Status: Full Code    THE FOLLOWING WERE PRESENT ON ADMISSION: Hypertensive emergency, acute ischemic stroke, dysarthria, mycosis fungoides    CRITICAL CARE ATTESTATION Performed by: Amie Portland, MD Total critical care time: 39 minutes Critical care time was exclusive of separately billable procedures and treating other patients and/or supervising APPs/Residents/Students Critical care was necessary to treat or prevent imminent or life-threatening deterioration due to acute ischemic stroke, IV thrombolysis This patient is critically ill and at significant risk for neurological worsening and/or death and care requires constant monitoring. Critical care was time spent personally by me on the following activities: development of treatment plan with patient and/or surrogate as well as nursing, discussions with consultants, evaluation of patient's response to treatment, examination of patient, obtaining history from patient or surrogate, ordering and performing treatments and interventions, ordering and review of laboratory studies, ordering and review of radiographic studies, pulse oximetry, re-evaluation of patient's condition, participation in multidisciplinary rounds and medical decision making of high complexity in the care of this patient.

## 2021-08-20 NOTE — Progress Notes (Signed)
PHARMACIST CODE STROKE RESPONSE  Notified to mix TNK at 2232 by Dr. Carloyn Jaeger Delivered TNK to RN at 2237  TNK dose = 21 mg IV over 5 seconds  Issues/delays encountered (if applicable): none  Erin Hearing PharmD., BCPS Clinical Pharmacist 08/20/2021 10:39 PM

## 2021-08-20 NOTE — ED Provider Notes (Signed)
[facility] EMERGENCY DEPARTMENT Provider Note   CSN: [id number] Arrival date & time: 08/20/21  2221     History CC: Left facial numbness   Edward Thornton is a 75 y.o. male with a history of hypertension, hyperlipidemia, smoking, presenting to emergency department with left lower sided facial numbness and droop.  Onset of symptoms was 9:40 pm this evening.  He reports abruptly feeling numbness and weakness and drooping on the left lower side of his face, have some difficulty speaking.  He denies headache.  This never happened before.  Denies any known history of stroke.  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   Hyperlipidemia    Hypertension     Patient Active Problem List   Diagnosis Date Noted   Acute ischemic stroke ([place]) 08/20/2021   Bilateral primary osteoarthritis of knee 06/01/2021   Tobacco abuse 09/19/2018   Routine general medical examination at a health care facility 08/24/2016   BPV (benign positional vertigo) 08/02/2016   Essential hypertension 08/26/2015   Hyperlipidemia 08/26/2015    Past Surgical History:  Procedure Laterality Date   adominal hernia  1990   HERNIA REPAIR Left 12/10/1997   KNEE ARTHROSCOPY  1990   right   KNEE ARTHROSCOPY  2010   left   PILONIDAL CYST EXCISION  1980       Family History  Problem Relation Age of Onset   Stroke Mother    Heart disease Mother    Stroke Father    Heart disease Father    Colon cancer Neg Hx    Stomach cancer Neg Hx     Social History   Tobacco Use   Smoking status: Every Day    Packs/day: 1.00    Years: 50.00    Pack years: 50.00    Types: Cigarettes    Start date: 12/10/1964   Smokeless tobacco: Never  Vaping Use   Vaping Use: Never used  Substance Use Topics   Alcohol use: Yes    Alcohol/week: 3.0 standard drinks    Types: 3 Cans of beer per week    Comment: drinks beer; 3 or 4 a week    Drug use: No     Home Medications Prior to Admission medications   Medication Sig Start Date End Date Taking? Authorizing Provider  amLODipine (NORVASC) 10 MG tablet TAKE 1 TABLET BY MOUTH  DAILY 11/29/20   Hoyt Koch, MD  aspirin 81 MG tablet Take 81 mg by mouth daily.    [provider]  atorvastatin (LIPITOR) 20 MG tablet TAKE 1 TABLET BY MOUTH  DAILY 11/29/20   Hoyt Koch, MD  cholecalciferol (VITAMIN D) 1000 units tablet Take 1 tablet (1,000 Units total) by mouth 2 (two) times daily. Takes 2 tablets twice daily 01/18/16   Hoyt Koch, MD  hydrochlorothiazide (HYDRODIURIL) 25 MG tablet TAKE 1 TABLET BY MOUTH  DAILY 11/29/20   Hoyt Koch, MD  losartan (COZAAR) 100 MG tablet TAKE 1 TABLET BY MOUTH  DAILY 11/29/20   Hoyt Koch, MD  meclizine (ANTIVERT) 25 MG tablet Take 1 tablet (25 mg total) by mouth 3 (three) times daily as needed for dizziness. 08/02/16   [name], FNP  naproxen sodium (ALEVE) 220 MG tablet Take 220 mg by mouth as needed.    [provider]  Omega-3 Fatty Acids (FISH OIL) 1200 MG CAPS  Take 1 capsule (1,200 mg total) by mouth daily. Take 1 capsule daily 01/18/16   Hoyt Koch, MD  Turmeric 500 MG TABS Take by mouth.    [provider]    Allergies    Patient has no known allergies.  Review of Systems   Review of Systems  Constitutional:  Negative for chills and fever.  Eyes:  Negative for pain and visual disturbance.  Respiratory:  Negative for cough and shortness of breath.   Cardiovascular:  Negative for chest pain and palpitations.  Gastrointestinal:  Negative for abdominal pain and vomiting.  Genitourinary:  Negative for dysuria and hematuria.  Musculoskeletal:  Negative for arthralgias and back pain.  Skin:  Negative for color change and rash.  Neurological:  Positive for speech difficulty, weakness and numbness. Negative for syncope, facial asymmetry and headaches.  All other  systems reviewed and are negative.  Physical Exam Updated Vital Signs Wt 82.4 kg   BMI 24.64 kg/m   Physical Exam Constitutional:      General: He is not in acute distress. HENT:     Head: Normocephalic and atraumatic.  Eyes:     Conjunctiva/sclera: Conjunctivae normal.     Pupils: Pupils are equal, round, and reactive to light.  Cardiovascular:     Rate and Rhythm: Normal rate and regular rhythm.  Pulmonary:     Effort: Pulmonary effort is normal. No respiratory distress.  Abdominal:     General: There is no distension.     Tenderness: There is no abdominal tenderness.  Skin:    General: Skin is warm and dry.  Neurological:     General: No focal deficit present.     Mental Status: He is alert. Mental status is at baseline.     GCS: GCS eye subscore is 4. GCS verbal subscore is 5. GCS motor subscore is 6.     Motor: Motor function is intact.     Coordination: Coordination is intact.     Comments: Left lower facial paresthesia, left lower facial droop sparing the forehead muscles and periorbital muscles Mildly slurred speech  Psychiatric:        Mood and Affect: Mood normal.        Behavior: Behavior normal.    ED Results / Procedures / Treatments   Labs (all labs ordered are listed, but only abnormal results are displayed) Labs Reviewed  I-STAT CHEM 8, ED - Abnormal; Notable for the following components:      Result Value   Potassium 3.2 (*)    Glucose, Bld 105 (*)    Calcium, Ion 1.12 (*)    All other components within normal limits  RESP PANEL BY RT-PCR (FLU A&B, COVID) ARPGX2  PROTIME-INR  APTT  CBC  DIFFERENTIAL  ETHANOL  COMPREHENSIVE METABOLIC PANEL  RAPID URINE DRUG SCREEN, HOSP PERFORMED  URINALYSIS, ROUTINE W REFLEX MICROSCOPIC  HEMOGLOBIN A1C  LIPID PANEL    EKG None  Radiology CT HEAD CODE STROKE WO CONTRAST  Result Date: 08/20/2021 CLINICAL DATA:  Code stroke. Initial evaluation for acute stroke. Damage shift infarct attacks EXAM: CT HEAD  WITHOUT CONTRAST TECHNIQUE: Contiguous axial images were obtained from the base of the skull through the vertex without intravenous contrast. COMPARISON:  None. FINDINGS: Brain: Age-related cerebral atrophy with mild chronic microvascular ischemic disease. No acute intracranial hemorrhage. No acute large vessel territory infarct. No mass lesion or midline shift. No hydrocephalus or extra-axial fluid collection. Vascular: No hyperdense vessel. Scattered vascular calcifications noted within the carotid  siphons. Skull: Scalp soft tissues and calvarium within normal limits. Sinuses/Orbits: Globes and orbital soft tissues within normal limits. Paranasal sinuses are largely clear. No mastoid effusion. Other: None. ASPECTS [facility] Stroke Program Early CT Score) - Ganglionic level infarction (caudate, lentiform nuclei, internal capsule, insula, M1-M3 cortex): 7 - Supraganglionic infarction (M4-M6 cortex): 3 Total score (0-10 with 10 being normal): 10 IMPRESSION: 1. No acute intracranial infarct or other abnormality. 2. ASPECTS is 10. 3. Age-related cerebral atrophy with mild chronic small vessel ischemic disease. These results were communicated to Dr. Rory Percy at 10:49 pm on 08/20/2021 by text page via the [facility] messaging system. Electronically Signed   By: Jeannine Boga M.D.   On: 08/20/2021 22:50    Procedures .Critical Care Performed by: Wyvonnia Dusky, MD Authorized by: Wyvonnia Dusky, MD   Critical care provider statement:    Critical care time (minutes):  40   Critical care was necessary to treat or prevent imminent or life-threatening deterioration of the following conditions:  CNS failure or compromise   Critical care was time spent personally by me on the following activities:  Discussions with consultants, evaluation of patient's response to treatment, examination of patient, ordering and performing treatments and interventions, ordering and review of laboratory studies, ordering and review of  radiographic studies, pulse oximetry, re-evaluation of patient's condition, obtaining history from patient or surrogate and review of old charts Comments:     Code stroke, discussion & consultation with neurology, TNK administered   Medications Ordered in ED Medications  tenecteplase (TNKASE) injection for Stroke 21 mg (has no administration in time range)   stroke: mapping our early stages of recovery book (has no administration in time range)  0.9 %  sodium chloride infusion (has no administration in time range)  acetaminophen (TYLENOL) tablet 650 mg (has no administration in time range)    Or  acetaminophen (TYLENOL) 160 MG/5ML solution 650 mg (has no administration in time range)    Or  acetaminophen (TYLENOL) suppository 650 mg (has no administration in time range)  senna-docusate (Senokot-S) tablet 1 tablet (has no administration in time range)  pantoprazole (PROTONIX) injection 40 mg (has no administration in time range)  labetalol (NORMODYNE) injection 20 mg (has no administration in time range)    And  clevidipine (CLEVIPREX) infusion 0.5 mg/mL (has no administration in time range)  iohexol (OMNIPAQUE) 350 MG/ML injection 75 mL (75 mLs Intravenous Contrast Given 08/20/21 2247)    ED Course  I have reviewed the triage vital signs and the nursing notes.  Pertinent labs & imaging results that were available during my care of the patient were reviewed by me and considered in my medical decision making (see chart for details).  Patient is presenting as a code stroke with acute onset of left lower facial numbness and facial droop with some mildly slurred speech.  Patient was seen by the neurologist upon arrival, as well as myself.  His airway was intact.  He was fully oriented.  He was taken to CT scan, and in conjunction with discussion with neurology, was felt to be candidate for TNK.  The patient was consented for this medication by the neurologist, and agreed for the medication.  He  has been given this medication will be monitored afterwards, and admitted.  Clinical Course as of 08/20/21 2255  Sun Aug 20, 2021  2250 Pt admitted to neuro ICU in stable condition following TNK [MT]    Clinical Course User Index [MT] Langston Masker Carola Rhine, MD  Final Clinical Impression(s) / ED Diagnoses Final diagnoses:  Facial paresthesia  Facial droop    Rx / DC Orders ED Discharge Orders     None        Wyvonnia Dusky, MD 08/20/21 2255

## 2021-08-21 ENCOUNTER — Inpatient Hospital Stay (HOSPITAL_COMMUNITY): Payer: Medicare Other

## 2021-08-21 DIAGNOSIS — I6389 Other cerebral infarction: Secondary | ICD-10-CM | POA: Diagnosis not present

## 2021-08-21 LAB — RESP PANEL BY RT-PCR (FLU A&B, COVID) ARPGX2
Influenza A by PCR: NEGATIVE
Influenza B by PCR: NEGATIVE
SARS Coronavirus 2 by RT PCR: NEGATIVE

## 2021-08-21 LAB — URINALYSIS, ROUTINE W REFLEX MICROSCOPIC
Bilirubin Urine: NEGATIVE
Glucose, UA: NEGATIVE mg/dL
Hgb urine dipstick: NEGATIVE
Ketones, ur: NEGATIVE mg/dL
Leukocytes,Ua: NEGATIVE
Nitrite: NEGATIVE
Protein, ur: NEGATIVE mg/dL
Specific Gravity, Urine: 1.01 (ref 1.005–1.030)
pH: 6 (ref 5.0–8.0)

## 2021-08-21 LAB — LIPID PANEL
Cholesterol: 136 mg/dL (ref 0–200)
HDL: 37 mg/dL — ABNORMAL LOW (ref >40)
LDL Cholesterol: 87 mg/dL (ref 0–99)
Total CHOL/HDL Ratio: 3.7 RATIO
Triglycerides: 62 mg/dL (ref <150)
VLDL: 12 mg/dL (ref 0–40)

## 2021-08-21 LAB — RAPID URINE DRUG SCREEN, HOSP PERFORMED
Amphetamines: NOT DETECTED
Barbiturates: NOT DETECTED
Benzodiazepines: NOT DETECTED
Cocaine: NOT DETECTED
Opiates: NOT DETECTED
Tetrahydrocannabinol: NOT DETECTED

## 2021-08-21 LAB — ECHOCARDIOGRAM COMPLETE
AR max vel: 3.48 cm2
AV Area VTI: 3.3 cm2
AV Area mean vel: 3.36 cm2
AV Mean grad: 4 mmHg
AV Peak grad: 6.7 mmHg
Ao pk vel: 1.29 m/s
Area-P 1/2: 3.54 cm2
Height: 72 in
S' Lateral: 2.6 cm
Weight: 2906.54 oz

## 2021-08-21 LAB — MRSA NEXT GEN BY PCR, NASAL: MRSA by PCR Next Gen: NOT DETECTED

## 2021-08-21 LAB — HEMOGLOBIN A1C
Hgb A1c MFr Bld: 5.9 % — ABNORMAL HIGH (ref 4.8–5.6)
Mean Plasma Glucose: 122.63 mg/dL

## 2021-08-21 MED ORDER — CHLORHEXIDINE GLUCONATE CLOTH 2 % EX PADS
6.0000 | MEDICATED_PAD | Freq: Every day | CUTANEOUS | Status: DC
  Administered 2021-08-21: 6 via TOPICAL

## 2021-08-21 MED ORDER — INFLUENZA VAC A&B SA ADJ QUAD 0.5 ML IM PRSY
0.5000 mL | PREFILLED_SYRINGE | INTRAMUSCULAR | Status: AC
  Administered 2021-08-22: 0.5 mL via INTRAMUSCULAR
  Filled 2021-08-21: qty 0.5

## 2021-08-21 NOTE — Progress Notes (Signed)
OT Cancellation Note  Patient Details Name: Edward Thornton MRN: [id number] DOB: Dec 12, 1945   Cancelled Treatment:    Reason Eval/Treat Not Completed: Active bedrest order. Have left chat text with RN to let us know when pt comes off of bedrest.  [name], OTR/L Acute Rehab Services Pager [phone] Office [phone]    Almon Register 08/21/2021, 7:14 AM

## 2021-08-21 NOTE — Progress Notes (Signed)
OT Cancellation Note  Patient Details Name: Edward Thornton MRN: [id number] DOB: 1946-04-17   Cancelled Treatment:    Reason Eval/Treat Not Completed: OT screened, no needs identified, will sign off. Spoke to PT who evaluated pt and pt did well (independent) except for a little bobble with stepping over something due to a "bad" knee. He has a walk in shower, shower seat, and grab bars at home so will not have to step over into a tub. No skilled OT needs identified by PT.  [name], OTR/L Acute Rehab Services Pager [phone] Office [phone]    Almon Register 08/21/2021, 1:23 PM

## 2021-08-21 NOTE — Progress Notes (Signed)
1a Level of Conscious.: 0 1b LOC Questions: 0 1c LOC Commands: 0 2 Best Gaze: 0 3 Visual: 0 4 Facial Palsy: 1 5a Motor Arm - left: 0 5b Motor Arm - Right: 0 6a Motor Leg - Left: 0 6b Motor Leg - Right: 0 7 Limb Ataxia: 0 8 Sensory: 0 9 Best Language: 0 10 Dysarthria: 0 11 Extinct. and Inatten.: 0 TOTAL: 1  OK for OPTIMIST trial. Orders placed.  -- Amie Portland, MD Neurologist [facility] Pager: [phone]

## 2021-08-21 NOTE — Progress Notes (Addendum)
STROKE TEAM PROGRESS NOTE   INTERVAL HISTORY 75 year old admitted with left sided facial numbness (lower left face) as well as left facial droop and difficulty with speech.  He received thrombolysis with IV TNKase with near complete resolution.  CT shows no abnormalities, MRI pending.  No family is at bedside at time of this exam.  Vital signs are stable.  Blood pressure adequately controlled.  Vitals:   08/21/21 0437 08/21/21 0600 08/21/21 0637 08/21/21 0736  BP: 115/61 (!) 128/58 127/65   Pulse: (!) 54 (!) 53 (!) 55   Resp: '16 17 15   '$ Temp: 97.8 F (36.6 C)  97.8 F (36.6 C) 98.2 F (36.8 C)  TempSrc: Oral  Oral Oral  SpO2: 91% 91% 93%   Weight:      Height:       CBC:  Recent Labs  Lab 08/20/21 2224 08/20/21 2229  WBC 8.9  --   NEUTROABS 4.9  --   HGB 16.2 16.0  HCT 47.6 47.0  MCV 95.2  --   PLT 257  --    Basic Metabolic Panel:  Recent Labs  Lab 08/20/21 2224 08/20/21 2229  NA 136 138  K 3.4* 3.2*  CL 103 103  CO2 22  --   GLUCOSE 107* 105*  BUN 16 18  CREATININE 0.89 0.90  CALCIUM 9.5  --     Lipid Panel:  Recent Labs  Lab 08/21/21 0508  CHOL 136  TRIG 62  HDL 37*  CHOLHDL 3.7  VLDL 12  LDLCALC 87    HgbA1c:  Recent Labs  Lab 08/21/21 0508  HGBA1C 5.9*   Urine Drug Screen:  Recent Labs  Lab 08/21/21 0002  LABOPIA NONE DETECTED  COCAINSCRNUR NONE DETECTED  LABBENZ NONE DETECTED  AMPHETMU NONE DETECTED  THCU NONE DETECTED  LABBARB NONE DETECTED    Alcohol Level  Recent Labs  Lab 08/20/21 2224  ETH <10    IMAGING past 24 hours CT HEAD CODE STROKE WO CONTRAST  Result Date: 08/20/2021 CLINICAL DATA:  Code stroke. Initial evaluation for acute stroke. Damage shift infarct attacks EXAM: CT HEAD WITHOUT CONTRAST TECHNIQUE: Contiguous axial images were obtained from the base of the skull through the vertex without intravenous contrast. COMPARISON:  None. FINDINGS: Brain: Age-related cerebral atrophy with mild chronic microvascular  ischemic disease. No acute intracranial hemorrhage. No acute large vessel territory infarct. No mass lesion or midline shift. No hydrocephalus or extra-axial fluid collection. Vascular: No hyperdense vessel. Scattered vascular calcifications noted within the carotid siphons. Skull: Scalp soft tissues and calvarium within normal limits. Sinuses/Orbits: Globes and orbital soft tissues within normal limits. Paranasal sinuses are largely clear. No mastoid effusion. Other: None. ASPECTS [facility] Stroke Program Early CT Score) - Ganglionic level infarction (caudate, lentiform nuclei, internal capsule, insula, M1-M3 cortex): 7 - Supraganglionic infarction (M4-M6 cortex): 3 Total score (0-10 with 10 being normal): 10 IMPRESSION: 1. No acute intracranial infarct or other abnormality. 2. ASPECTS is 10. 3. Age-related cerebral atrophy with mild chronic small vessel ischemic disease. These results were communicated to Dr. Rory Percy at 10:49 pm on 08/20/2021 by text page via the [facility] messaging system. Electronically Signed   By: Jeannine Boga M.D.   On: 08/20/2021 22:50   CT ANGIO HEAD NECK W WO CM (CODE STROKE)  Result Date: 08/20/2021 CLINICAL DATA:  Initial evaluation for acute stroke. EXAM: CT ANGIOGRAPHY HEAD AND NECK TECHNIQUE: Multidetector CT imaging of the head and neck was performed using the standard protocol during bolus  administration of intravenous contrast. Multiplanar CT image reconstructions and MIPs were obtained to evaluate the vascular anatomy. Carotid stenosis measurements (when applicable) are obtained utilizing NASCET criteria, using the distal internal carotid diameter as the denominator. CONTRAST:  2m OMNIPAQUE IOHEXOL 350 MG/ML SOLN COMPARISON:  CT from earlier the same day. FINDINGS: CTA NECK FINDINGS Aortic arch: Visualized aortic arch normal in caliber with normal 3 vessel morphology. Moderate atheromatous change about the arch and origin of the great vessels without high-grade stenosis.  Right carotid system: Right CCA patent without significant stenosis. Scattered plaque about the right carotid bifurcation/proximal right ICA without significant stenosis. Right ICA patent distally without stenosis or other abnormality. Left carotid system: Left CCA patent without significant stenosis. Minimal plaque about the left bifurcation without significant stenosis. Left ICA patent distally without stenosis or other abnormality. Vertebral arteries: Both vertebral arteries arise from the subclavian arteries. No significant proximal subclavian artery stenosis. Atheromatous change at the origins of both vertebral arteries with no more than mild stenosis. Vertebral arteries patent distally without stenosis or other abnormality. Skeleton: No visible acute osseous finding. No discrete or worrisome osseous lesions. Moderate spondylosis at C5-6 and C6-7. Other neck: No other acute soft tissue abnormality within the neck. No mass or adenopathy. Upper chest: Emphysema. Visualized upper chest demonstrates no other acute finding. Review of the MIP images confirms the above findings CTA HEAD FINDINGS Anterior circulation: Petrous segments patent bilaterally. Atheromatous change within the carotid siphons with no more than mild multifocal narrowing. A1 segments widely patent. Normal anterior communicating artery complex. Anterior cerebral arteries patent to their distal aspects without stenosis. Normal in stenosis or occlusion. Normal MCA bifurcations. No proximal MCA branch occlusion. Distal MCA branches well perfused and symmetric. Posterior circulation: Both vertebral arteries patent to the vertebrobasilar junction without stenosis. Both PICA origins patent and normal. Basilar patent to its distal aspect without stenosis. Superior cerebellar arteries patent bilaterally. Left PCA supplied via the basilar. Predominant fetal type origin of the right PCA. PCAs well perfused to their distal aspects without stenosis. Venous  sinuses: Grossly patent allowing for timing the contrast bolus. Anatomic variants: Predominant fetal type origin of the right PCA. No aneurysm. Review of the MIP images confirms the above findings IMPRESSION: 1. Negative CTA for large vessel occlusion or other acute vascular abnormality. 2. Mild-to-moderate atheromatous disease without hemodynamically significant stenosis about the major arterial vasculature of the head and neck 3. Emphysema (ICD10-J43.9). These results were communicated to Dr. ARory Percyat 10:59 p.m. on 08/20/2021 by text page [facility]. Electronically Signed   By: BJeannine BogaM.D.   On: 08/20/2021 23:07    PHYSICAL EXAM Pleasant elderly Caucasian male not in distress. . Afebrile. Head is nontraumatic. Neck is supple without bruit.    Cardiac exam no murmur or gallop. Lungs are clear to auscultation. Distal pulses are well felt.   NEURO:  Mental Status: AA&Ox3 Language: speech is minimally dysarthric, naming, repetition, fluency, and comprehension intact. Cranial Nerves: PERRL. EOMI, visual fields full, no facial asymmetry, facial sensation intact, hearing intact, tongue/uvula/soft palate midline, normal sternocleidomastoid and trapezius muscle strength. No evidence of tongue atrophy or fibrillations Motor: 5/5 in all fours without drift Tone: is normal and bulk is normal Sensation- Intact to light touch bilaterally   Coordination: FTN intact bilaterally, no ataxia in BLE. Gait- deferred  ASSESSMENT/PLAN Mr. Edward GREENAWAYis a 75y.o. male with history of  hypertension, hyperlipidemia, arthritis and tobacco abuse, was in usual state of health at  9:40 PM when he had a sudden onset of left-sided facial numbness followed by left-sided facial droop and some difficulty with his speech. He describes that he was sitting after dinner at home and suddenly noticed that a sensation like Novocain that you get at the dentist came about on the lower part of his left face.   It also felt like his left face was not acting normally as it would.  He did not initially complain of any speech difficulty but upon having him talk and repeat sentences, it did appear that his speech was somewhat slurred to. His upper face was preserved. Neurological exam performed at the bridge with an NIH of 3. Risks and benefits of IV tPA discussed. EMS blood pressure 184/84. Repeat blood pressure within parameters for IV thrombolysis and IV thrombolysis initiated at 2237 with TNK    .   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  Small vessel disease. Atrophy.  ASPECTS 10.     CTA head & neck: 1. Negative CTA for large vessel occlusion or other acute vascular abnormality. 2. Mild-to-moderate atheromatous disease without hemodynamically significant stenosis about the major arterial vasculature of the head and neck 3. Emphysema (ICD10-J43.9).    MRI  pending 2D Echo pending   LDL 87 HgbA1c 5.9 VTE prophylaxis - scd    Diet   Diet NPO time specified   aspirin 81 mg daily prior to admission, now on No antithrombotic pending MRI Therapy recommendations:  pending Disposition:  pending   Hypertension Home meds:  hctz, losartan Stable Permissive hypertension (OK if < 220/120) but gradually normalize in 5-7 days Long-term BP goal normotensive  Hyperlipidemia Home meds:   lipitor, resumed in hospital LDL 87, goal < 70 High intensity statin lipitor '80mg'$  started  Continue statin at discharge   HgbA1c 5.9, goal < 7.0 CBGs No results for input(s): GLUCAP in the last 72 hours.  SSI  Other Stroke Risk Factors  Advanced Age >/= 74   Other Active Problems  Emphysema   Hospital day # 1 I have personally obtained history,examined this patient, reviewed notes, independently viewed imaging studies, participated in medical decision making and plan of care.ROS  completed by me personally and pertinent positives fully documented  I have made any additions or clarifications directly to the above note. Agree with note above.  Patient presented with dysarthria and left facial weakness and received IV thrombolysis with TNKase and is made near complete recovery.  Recommend continue close neurological monitoring and strict blood pressure control as per post thrombolysis protocol.  Mobilize out of bed.  Therapy consults.  Check echocardiogram and MRI scan.  Aggressive risk factor modification.  Long discussion with patient and answered questions. This patient is critically ill and at significant risk of neurological worsening, death and care requires constant monitoring of vital signs, hemodynamics,respiratory and cardiac monitoring, extensive review of multiple databases, frequent neurological assessment, discussion with family, other specialists and medical decision making of high complexity.I have made any additions or clarifications directly to the above note.This critical care time does not reflect procedure time, or teaching time or supervisory time of PA/NP/Med Resident etc but could involve care discussion time.  I spent 30 minutes of neurocritical care time  in the care of  this patient.      Antony Contras, MD Medical Director [place] Pager: [phone] 08/21/2021 5:38  PM     To contact Stroke Continuity provider, please refer to [email or web address] After hours, contact General Neurology

## 2021-08-21 NOTE — Research (Signed)
OPTMISTmain Research Trial   The patient meets criteria for the OPTIMISTmain Research Trial. The patient received tenectaplase (TNK) with a NIHSS < 10 with no critical care needs two hours after bolus given.   I have reviewed the Patient Information Sheet and the patient reports understanding of data collection, discharge questions, and 90 day questions. No further questions noted and Patient Information Sheet left with patient and his wife.   Patient reports a good number for follow-up care is: [place] Stroke Response RN

## 2021-08-21 NOTE — Evaluation (Signed)
Physical Therapy Evaluation Patient Details Name: Edward Thornton MRN: [id number] DOB: June 06, 1946 Today's Date: 08/21/2021  History of Present Illness  Edward Thornton is a 75 y.o. male who has a past medical history of hypertension, hyperlipidemia, arthritis and tobacco abuse, was in usual state of health at 9:40 PM when he had a sudden onset of left-sided facial numbness followed by left-sided facial droop and some difficulty with his speech.m  Had TNKase on admission, still awaiting further work up.  Clinical Impression  Patient presents with mobility close to functional baseline.  Used a cane PTA due to L knee OA and feels LOB today when challenged to step over obstacle more due to knee issues.  Feel stable for home and will not need follow up PT at d/c.        Recommendations for follow up therapy are one component of a multi-disciplinary discharge planning process, led by the attending physician.  Recommendations may be updated based on patient status, additional functional criteria and insurance authorization.  Follow Up Recommendations No PT follow up    Equipment Recommendations  None recommended by PT    Recommendations for Other Services       Precautions / Restrictions Precautions Precautions: Fall Precaution Comments: uses cane intermittently due to bad L knee      Mobility  Bed Mobility               General bed mobility comments: up in chair    Transfers Overall transfer level: Needs assistance Equipment used: None Transfers: Sit to/from Stand Sit to Stand: Supervision         General transfer comment: for lines/safety  Ambulation/Gait Ambulation/Gait assistance: Supervision Gait Distance (Feet): 350 Feet Assistive device: None Gait Pattern/deviations: Step-through pattern;Decreased stride length;Shuffle     General Gait Details: mild antalgia on L initially, improved with ambulation, attempting to step over imaginary obstacle pt with LOB to R and min  A recovery  Stairs            Wheelchair Mobility    Modified Rankin (Stroke Patients Only) Modified Rankin (Stroke Patients Only) Pre-Morbid Rankin Score: No symptoms Modified Rankin: Moderate disability     Balance Overall balance assessment: Needs assistance   Sitting balance-Leahy Scale: Normal Sitting balance - Comments: placed socks on in sitting   Standing balance support: No upper extremity supported Standing balance-Leahy Scale: Good Standing balance comment: mild imbalance with higher level balance tasks; able to turn and stop, walk with head turns/nods and change direction without LOB, one LOB stepping over imaginary obstacle                             Pertinent Vitals/Pain Pain Assessment: No/denies pain    Home Living Family/patient expects to be discharged to:: Private residence Living Arrangements: Spouse/significant other Available Help at Discharge: Family Type of Home: House Home Access: Stairs to enter   Technical brewer of Steps: 1 Home Layout: One level Home Equipment: Cane - single point;Shower seat;Grab bars - tub/shower      Prior Function Level of Independence: Independent with assistive device(s)         Comments: intermittent cane use     Hand Dominance        Extremity/Trunk Assessment   Upper Extremity Assessment Upper Extremity Assessment: Overall WFL for tasks assessed    Lower Extremity Assessment Lower Extremity Assessment: Overall WFL for tasks assessed  Communication   Communication: No difficulties  Cognition Arousal/Alertness: Awake/alert Behavior During Therapy: WFL for tasks assessed/performed Overall Cognitive Status: Within Functional Limits for tasks assessed                                        General Comments General comments (skin integrity, edema, etc.): reviewed possibly reasons for balance issues, discussed options to practice at home and smoking  cessation to help with balance issues and stroke prevention    Exercises     Assessment/Plan    PT Assessment Patent does not need any further PT services  PT Problem List         PT Treatment Interventions      PT Goals (Current goals can be found in the Care Plan section)  Acute Rehab PT Goals PT Goal Formulation: All assessment and education complete, DC therapy    Frequency     Barriers to discharge        Co-evaluation               AM-PAC PT "6 Clicks" Mobility  Outcome Measure Help needed turning from your back to your side while in a flat bed without using bedrails?: None Help needed moving from lying on your back to sitting on the side of a flat bed without using bedrails?: None Help needed moving to and from a bed to a chair (including a wheelchair)?: None Help needed standing up from a chair using your arms (e.g., wheelchair or bedside chair)?: None Help needed to walk in hospital room?: A Little Help needed climbing 3-5 steps with a railing? : A Little 6 Click Score: 22    End of Session   Activity Tolerance: Patient tolerated treatment well Patient left: in chair;with call bell/phone within reach;with family/visitor present   PT Visit Diagnosis: Other symptoms and signs involving the nervous system (R29.898)    Time: 1150-1210 PT Time Calculation (min) (ACUTE ONLY): 20 min   Charges:   PT Evaluation $PT Eval Low Complexity: 1 Low          Magda Kiel, PT Acute Rehabilitation Services [phone] Office:[phone] 08/21/2021   Reginia Naas 08/21/2021, 2:22 PM

## 2021-08-22 MED ORDER — ATORVASTATIN CALCIUM 40 MG PO TABS
40.0000 mg | ORAL_TABLET | Freq: Every day | ORAL | Status: DC
  Administered 2021-08-22: 40 mg via ORAL
  Filled 2021-08-22: qty 1

## 2021-08-22 MED ORDER — CLOPIDOGREL BISULFATE 75 MG PO TABS: 75.0000 mg | ORAL_TABLET | Freq: Every day | ORAL | 3 refills | Status: DC

## 2021-08-22 MED ORDER — ATORVASTATIN CALCIUM 40 MG PO TABS
40.0000 mg | ORAL_TABLET | Freq: Every day | ORAL | 3 refills | Status: DC
Start: 2021-08-22 — End: 2021-11-09

## 2021-08-22 MED ORDER — ASPIRIN EC 81 MG PO TBEC
81.0000 mg | DELAYED_RELEASE_TABLET | ORAL | Status: AC
  Administered 2021-08-22: 81 mg via ORAL
  Filled 2021-08-22: qty 1

## 2021-08-22 MED ORDER — CLOPIDOGREL BISULFATE 75 MG PO TABS
75.0000 mg | ORAL_TABLET | Freq: Every day | ORAL | Status: DC
  Administered 2021-08-22: 75 mg via ORAL
  Filled 2021-08-22: qty 1

## 2021-08-22 NOTE — Progress Notes (Signed)
  Speech Language Pathology  Patient Details Name: Edward Thornton MRN: [id number] DOB: 26-Oct-1946 Today's Date: 08/22/2021 Time:  -     Screened for speech-language-cognition. No full assessment needed.              Houston Siren 08/22/2021, 10:02 AM

## 2021-08-22 NOTE — Discharge Summary (Addendum)
Stroke Discharge Summary  Patient ID: Edward Thornton   MRN: [id number]      DOB: [birth year]  Date of Admission: 08/20/2021 Date of Discharge: 08/22/2021  Attending Physician:  Stroke, Md, MD, Stroke MD Consultant(s):    None   Patient's PCP:  Edward Koch, MD  DISCHARGE DIAGNOSIS:  Right brain subcortical transient ischemic attack likely from small vessel disease s/p IV thrombolysis with TNK with complete resolution Active Problems: TIA Mild hyperlipidemia Prediabetes   Allergies as of 08/22/2021   No Known Allergies      Medication List     TAKE these medications    acetaminophen 500 MG tablet Commonly known as: TYLENOL Take 1,000 mg by mouth every 6 (six) hours as needed for mild pain.   amLODipine 10 MG tablet Commonly known as: NORVASC TAKE 1 TABLET BY MOUTH  DAILY   aspirin 81 MG tablet Take 81 mg by mouth daily.   atorvastatin 40 MG tablet Commonly known as: LIPITOR Take 1 tablet (40 mg total) by mouth daily. What changed:  medication strength how much to take   cholecalciferol 1000 units tablet Commonly known as: VITAMIN D Take 1 tablet (1,000 Units total) by mouth 2 (two) times daily. Takes 2 tablets twice daily What changed:  how much to take additional instructions   clopidogrel 75 MG tablet Commonly known as: PLAVIX Take 1 tablet (75 mg total) by mouth daily.   dimenhyDRINATE 50 MG tablet Commonly known as: DRAMAMINE Take 50 mg by mouth every 8 (eight) hours as needed for dizziness.   Fish Oil 1200 MG Caps Take 1 capsule (1,200 mg total) by mouth daily. Take 1 capsule daily What changed:  how much to take additional instructions   hydrochlorothiazide 25 MG tablet Commonly known as: HYDRODIURIL TAKE 1 TABLET BY MOUTH  DAILY   losartan 100 MG tablet Commonly known as: COZAAR TAKE 1 TABLET BY MOUTH  DAILY   naproxen sodium 220 MG tablet Commonly known as: ALEVE Take 220 mg by mouth daily as needed (pain).   SALONPAS PAIN  RELIEF PATCH EX Apply 1 patch topically daily as needed (knee pain).   Turmeric 500 MG Tabs Take 500 mg by mouth daily.        LABORATORY STUDIES CBC    Component Value Date/Time   WBC 8.9 08/20/2021 2224   RBC 5.00 08/20/2021 2224   HGB 16.0 08/20/2021 2229   HCT 47.0 08/20/2021 2229   PLT 257 08/20/2021 2224   MCV 95.2 08/20/2021 2224   MCH 32.4 08/20/2021 2224   MCHC 34.0 08/20/2021 2224   RDW 12.5 08/20/2021 2224   LYMPHSABS 2.8 08/20/2021 2224   MONOABS 0.8 08/20/2021 2224   EOSABS 0.3 08/20/2021 2224   BASOSABS 0.1 08/20/2021 2224   CMP    Component Value Date/Time   NA 138 08/20/2021 2229   K 3.2 (L) 08/20/2021 2229   CL 103 08/20/2021 2229   CO2 22 08/20/2021 2224   GLUCOSE 105 (H) 08/20/2021 2229   BUN 18 08/20/2021 2229   CREATININE 0.90 08/20/2021 2229   CALCIUM 9.5 08/20/2021 2224   PROT 6.8 08/20/2021 2224   ALBUMIN 3.7 08/20/2021 2224   AST 17 08/20/2021 2224   ALT 13 08/20/2021 2224   ALKPHOS 63 08/20/2021 2224   BILITOT 1.0 08/20/2021 2224   GFRNONAA >60 08/20/2021 2224   COAGS Lab Results  Component Value Date   INR 1.0 08/20/2021   Lipid Panel    Component  Value Date/Time   CHOL 136 08/21/2021 0508   TRIG 62 08/21/2021 0508   HDL 37 (L) 08/21/2021 0508   CHOLHDL 3.7 08/21/2021 0508   VLDL 12 08/21/2021 0508   LDLCALC 87 08/21/2021 0508   HgbA1C  Lab Results  Component Value Date   HGBA1C 5.9 (H) 08/21/2021   Urinalysis    Component Value Date/Time   COLORURINE YELLOW 08/21/2021 0002   APPEARANCEUR CLEAR 08/21/2021 0002   LABSPEC 1.010 08/21/2021 0002   PHURINE 6.0 08/21/2021 0002   GLUCOSEU NEGATIVE 08/21/2021 0002   HGBUR NEGATIVE 08/21/2021 0002   BILIRUBINUR NEGATIVE 08/21/2021 0002   KETONESUR NEGATIVE 08/21/2021 0002   PROTEINUR NEGATIVE 08/21/2021 0002   NITRITE NEGATIVE 08/21/2021 0002   LEUKOCYTESUR NEGATIVE 08/21/2021 0002   Urine Drug Screen     Component Value Date/Time   LABOPIA NONE DETECTED 08/21/2021  0002   COCAINSCRNUR NONE DETECTED 08/21/2021 0002   LABBENZ NONE DETECTED 08/21/2021 0002   AMPHETMU NONE DETECTED 08/21/2021 0002   THCU NONE DETECTED 08/21/2021 0002   LABBARB NONE DETECTED 08/21/2021 0002    Alcohol Level    Component Value Date/Time   ETH <10 08/20/2021 2224     SIGNIFICANT DIAGNOSTIC STUDIES ECHOCARDIOGRAM COMPLETE  Result Date: 08/21/2021    ECHOCARDIOGRAM REPORT   Patient Name:   Edward Thornton Date of Exam: 08/21/2021 Medical Rec #:  [id number]    Height:       72.0 in Accession #:    [id number]   Weight:       181.7 lb Date of Birth:  [birth year]    BSA:          2.045 m Patient Age:    25 years     BP:           139/65 mmHg Patient Gender: M            HR:           54 bpm. Exam Location:  Inpatient Procedure: 2D Echo, Cardiac Doppler and Color Doppler Indications:    Stroke  History:        Patient has no prior history of Echocardiogram examinations.  Sonographer:    Merrie Roof RDCS Referring Phys: [id number] Edward Thornton  Sonographer Comments: Technically difficult study due to poor echo windows. IMPRESSIONS  1. Left ventricular ejection fraction, by estimation, is 60 to 65%. The left ventricle has normal function. The left ventricle has no regional wall motion abnormalities. There is mild left ventricular hypertrophy. Left ventricular diastolic parameters are consistent with Grade I diastolic dysfunction (impaired relaxation).  2. Right ventricular systolic function is normal. The right ventricular size is normal.  3. The mitral valve was not well visualized. No evidence of mitral valve regurgitation.  4. The aortic valve is tricuspid. Aortic valve regurgitation is not visualized. Mild aortic valve sclerosis is present, with no evidence of aortic valve stenosis.  5. The inferior vena cava is normal in size with <50% respiratory variability, suggesting right atrial pressure of 8 mmHg. Comparison(s): No prior Echocardiogram. FINDINGS  Left Ventricle: Left ventricular ejection  fraction, by estimation, is 60 to 65%. The left ventricle has normal function. The left ventricle has no regional wall motion abnormalities. The left ventricular internal cavity size was normal in size. There is  mild left ventricular hypertrophy. Left ventricular diastolic parameters are consistent with Grade I diastolic dysfunction (impaired relaxation). Indeterminate filling pressures. Right Ventricle: The right ventricular size is normal. No increase in right ventricular wall  thickness. Right ventricular systolic function is normal. Left Atrium: Left atrial size was normal in size. Right Atrium: Right atrial size was normal in size. Pericardium: There is no evidence of pericardial effusion. Mitral Valve: The mitral valve was not well visualized. No evidence of mitral valve regurgitation. Tricuspid Valve: The tricuspid valve is grossly normal. Tricuspid valve regurgitation is trivial. Aortic Valve: The aortic valve is tricuspid. Aortic valve regurgitation is not visualized. Mild aortic valve sclerosis is present, with no evidence of aortic valve stenosis. Aortic valve mean gradient measures 4.0 mmHg. Aortic valve peak gradient measures 6.7 mmHg. Aortic valve area, by VTI measures 3.30 cm. Pulmonic Valve: The pulmonic valve was normal in structure. Pulmonic valve regurgitation is not visualized. Aorta: The aortic root and ascending aorta are structurally normal, with no evidence of dilitation. Venous: The inferior vena cava is normal in size with less than 50% respiratory variability, suggesting right atrial pressure of 8 mmHg. IAS/Shunts: The interatrial septum was not well visualized.  LEFT VENTRICLE PLAX 2D LVIDd:         3.70 cm  Diastology LVIDs:         2.60 cm  LV e' medial:    7.29 cm/s LV PW:         1.20 cm  LV E/e' medial:  9.4 LV IVS:        1.20 cm  LV e' lateral:   9.25 cm/s LVOT diam:     2.20 cm  LV E/e' lateral: 7.4 LV SV:         103 LV SV Index:   50 LVOT Area:     3.80 cm  RIGHT VENTRICLE RV  Basal diam:  3.30 cm LEFT ATRIUM           Index       RIGHT ATRIUM           Index LA diam:      3.50 cm 1.71 cm/m  RA Area:     16.10 cm LA Vol (A4C): 30.9 ml 15.11 ml/m RA Volume:   35.90 ml  17.55 ml/m  AORTIC VALVE AV Area (Vmax):    3.48 cm AV Area (Vmean):   3.36 cm AV Area (VTI):     3.30 cm AV Vmax:           129.00 cm/s AV Vmean:          88.200 cm/s AV VTI:            0.312 m AV Peak Grad:      6.7 mmHg AV Mean Grad:      4.0 mmHg LVOT Vmax:         118.00 cm/s LVOT Vmean:        78.000 cm/s LVOT VTI:          0.271 m LVOT/AV VTI ratio: 0.87  AORTA Ao Root diam: 3.60 cm Ao Asc diam:  3.70 cm MITRAL VALVE MV Area (PHT): 3.54 cm    SHUNTS MV Decel Time: 214 msec    Systemic VTI:  0.27 m MV E velocity: 68.60 cm/s  Systemic Diam: 2.20 cm MV A velocity: 90.40 cm/s MV E/A ratio:  0.76 Lyman Bishop MD Electronically signed by Lyman Bishop MD Signature Date/Time: 08/21/2021/2:47:27 PM    Final    CT HEAD CODE STROKE WO CONTRAST  Result Date: 08/20/2021 CLINICAL DATA:  Code stroke. Initial evaluation for acute stroke. Damage shift infarct attacks EXAM: CT HEAD WITHOUT CONTRAST TECHNIQUE: Contiguous axial images were  obtained from the base of the skull through the vertex without intravenous contrast. COMPARISON:  None. FINDINGS: Brain: Age-related cerebral atrophy with mild chronic microvascular ischemic disease. No acute intracranial hemorrhage. No acute large vessel territory infarct. No mass lesion or midline shift. No hydrocephalus or extra-axial fluid collection. Vascular: No hyperdense vessel. Scattered vascular calcifications noted within the carotid siphons. Skull: Scalp soft tissues and calvarium within normal limits. Sinuses/Orbits: Globes and orbital soft tissues within normal limits. Paranasal sinuses are largely clear. No mastoid effusion. Other: None. ASPECTS [facility] Stroke Program Early CT Score) - Ganglionic level infarction (caudate, lentiform nuclei, internal capsule, insula, M1-M3  cortex): 7 - Supraganglionic infarction (M4-M6 cortex): 3 Total score (0-10 with 10 being normal): 10 IMPRESSION: 1. No acute intracranial infarct or other abnormality. 2. ASPECTS is 10. 3. Age-related cerebral atrophy with mild chronic small vessel ischemic disease. These results were communicated to Dr. Rory Percy at 10:49 pm on 08/20/2021 by text page via the [facility] messaging system. Electronically Signed   By: Jeannine Boga M.D.   On: 08/20/2021 22:50   CT ANGIO HEAD NECK W WO CM (CODE STROKE)  Result Date: 08/20/2021 CLINICAL DATA:  Initial evaluation for acute stroke. EXAM: CT ANGIOGRAPHY HEAD AND NECK TECHNIQUE: Multidetector CT imaging of the head and neck was performed using the standard protocol during bolus administration of intravenous contrast. Multiplanar CT image reconstructions and MIPs were obtained to evaluate the vascular anatomy. Carotid stenosis measurements (when applicable) are obtained utilizing NASCET criteria, using the distal internal carotid diameter as the denominator. CONTRAST:  9m OMNIPAQUE IOHEXOL 350 MG/ML SOLN COMPARISON:  CT from earlier the same day. FINDINGS: CTA NECK FINDINGS Aortic arch: Visualized aortic arch normal in caliber with normal 3 vessel morphology. Moderate atheromatous change about the arch and origin of the great vessels without high-grade stenosis. Right carotid system: Right CCA patent without significant stenosis. Scattered plaque about the right carotid bifurcation/proximal right ICA without significant stenosis. Right ICA patent distally without stenosis or other abnormality. Left carotid system: Left CCA patent without significant stenosis. Minimal plaque about the left bifurcation without significant stenosis. Left ICA patent distally without stenosis or other abnormality. Vertebral arteries: Both vertebral arteries arise from the subclavian arteries. No significant proximal subclavian artery stenosis. Atheromatous change at the origins of both  vertebral arteries with no more than mild stenosis. Vertebral arteries patent distally without stenosis or other abnormality. Skeleton: No visible acute osseous finding. No discrete or worrisome osseous lesions. Moderate spondylosis at C5-6 and C6-7. Other neck: No other acute soft tissue abnormality within the neck. No mass or adenopathy. Upper chest: Emphysema. Visualized upper chest demonstrates no other acute finding. Review of the MIP images confirms the above findings CTA HEAD FINDINGS Anterior circulation: Petrous segments patent bilaterally. Atheromatous change within the carotid siphons with no more than mild multifocal narrowing. A1 segments widely patent. Normal anterior communicating artery complex. Anterior cerebral arteries patent to their distal aspects without stenosis. Normal in stenosis or occlusion. Normal MCA bifurcations. No proximal MCA branch occlusion. Distal MCA branches well perfused and symmetric. Posterior circulation: Both vertebral arteries patent to the vertebrobasilar junction without stenosis. Both PICA origins patent and normal. Basilar patent to its distal aspect without stenosis. Superior cerebellar arteries patent bilaterally. Left PCA supplied via the basilar. Predominant fetal type origin of the right PCA. PCAs well perfused to their distal aspects without stenosis. Venous sinuses: Grossly patent allowing for timing the contrast bolus. Anatomic variants: Predominant fetal type origin of the right PCA. No aneurysm.  Review of the MIP images confirms the above findings IMPRESSION: 1. Negative CTA for large vessel occlusion or other acute vascular abnormality. 2. Mild-to-moderate atheromatous disease without hemodynamically significant stenosis about the major arterial vasculature of the head and neck 3. Emphysema (ICD10-J43.9). These results were communicated to Dr. Rory Percy at 10:59 p.m. on 08/20/2021 by text page via the [facility] messaging system. Electronically Signed   By: Jeannine Boga M.D.   On: 08/20/2021 23:07      HISTORY OF PRESENT ILLNESS   Edward Thornton is a 75 y.o. male who has a past medical history of hypertension, hyperlipidemia, arthritis and tobacco abuse, was in usual state of health at 9:40 PM when he had a sudden onset of left-sided facial numbness followed by left-sided facial droop and some difficulty with his speech. He describes that he was sitting after dinner at home and suddenly noticed that a sensation like Novocain that you get at the dentist came about on the lower part of his left face.  It also felt like his left face was not acting normally as it would.  He did not initially complain of any speech difficulty but upon having him talk and repeat sentences, it did appear that his speech was somewhat slurred to. His upper face was preserved. Neurological exam performed at the bridge with an NIH of 3. Risks and benefits of IV tPA discussed. EMS blood pressure 184/84. Repeat blood pressure within parameters for IV thrombolysis and IV thrombolysis initiated at 2237 with TNKase.    HOSPITAL COURSE  Unremarkable. On hospital day 1 post TNK patient was seen, examined. He had negative neurological examination. There was no facial droop, no decreased sensation. His vital signs were stable throughout. He was seen by PT and OT and there were no outpatient needs identified. MRI brain done 08/21/2021 was unremarkable. The rest of  his stroke workup was equally unremarkable: LDL 87/ he is to increase lipitor to '40mg'$  daily. His A1c was 5.9. echocardiogram showed an EF 65% and no IA shunt or LA enlargement.   Plan to continue with aspirin and plavix x 3 weeks. And given that patient had TIA while on aspirin, after 3 weeks he will discontinue aspirin and continue with plavix for life for secondary stroke prevention.  RN Pressure Injury Documentation:     DISCHARGE EXAM Blood pressure 126/86, pulse 64, temperature 97.8 F (36.6 C), temperature source Oral,  resp. rate 18, height 6' (1.829 m), weight 82.4 kg, SpO2 93 %.  Pleasant elderly Caucasian male not in distress. . Afebrile. Head is nontraumatic. Neck is supple without bruit.    Cardiac exam no murmur or gallop. Lungs are clear to auscultation. Distal pulses are well felt.   NEURO:  Mental Status: AA&Ox3 Language: speech is minimally dysarthric, naming, repetition, fluency, and comprehension intact. Cranial Nerves: PERRL. EOMI, visual fields full, no facial asymmetry, facial sensation intact, hearing intact, tongue/uvula/soft palate midline, normal sternocleidomastoid and trapezius muscle strength. No evidence of tongue atrophy or fibrillations Motor: 5/5 in all fours without drift Tone: is normal and bulk is normal Sensation- Intact to light touch bilaterally   Coordination: FTN intact bilaterally, no ataxia in BLE. Gait- deferred  Discharge Diet       Diet   Diet Heart Room service appropriate? Yes; Fluid consistency: Thin   liquids  DISCHARGE PLAN Disposition:  home aspirin 81 mg daily and clopidogrel 75 mg daily for secondary stroke prevention for 3 weeks then plavix alone. Ongoing stroke risk factor control  by Primary Care Physician at time of discharge Follow-up PCP Edward Koch, MD in 2 weeks. Follow-up in [facility] in 8 weeks, office to schedule an appointment.   35 minutes were spent preparing discharge.  I have personally obtained history,examined this patient, reviewed notes, independently viewed imaging studies, participated in medical decision making and plan of care.ROS completed by me personally and pertinent positives fully documented  I have made any additions or clarifications directly to the above note. Agree with note above.   Antony Contras, MD Medical Director [facility] Pager: [phone] 08/22/2021 3:19 PM

## 2021-09-04 ENCOUNTER — Telehealth: Payer: Self-pay

## 2021-09-04 NOTE — Telephone Encounter (Signed)
Spoke with patient to schedule his 8 week hospital follow up and he advised that yesterday morning, he experienced the same symptoms of numbness and facial droop (lip) that brought him to the ED previously. He did state that the symptoms were not as severe or pronounced, and that they occurred on the opposite side of where they previously occurred.    He advised that he sat and rested and it started resolving in 40-50 minutes. He wanted to see if he should be scheduled for a f/u sooner than the requested 8 weeks and if it should be with Dr Leonie Man or Frann Rider, NP.  He has not spoken with PCP regarding these concerns. He has not experienced any new or concerning symptoms today. I did advise that if the symptoms concern, he should go to the ED.  Please advise.

## 2021-09-04 NOTE — Telephone Encounter (Signed)
Would recommend he proceed to ED for emergent evaluation in setting of recent stroke and increased risk of additional stroke during this time. We cannot do any type of emergent work-up or imaging in our office

## 2021-09-05 ENCOUNTER — Emergency Department (HOSPITAL_COMMUNITY): Payer: Medicare Other

## 2021-09-05 ENCOUNTER — Encounter: Payer: Self-pay | Admitting: Internal Medicine

## 2021-09-05 ENCOUNTER — Emergency Department (HOSPITAL_COMMUNITY)
Admission: EM | Admit: 2021-09-05 | Discharge: 2021-09-05 | Disposition: A | Discharge status: Home / Self Care | Payer: Medicare Other | Attending: Emergency Medicine | Admitting: Emergency Medicine

## 2021-09-05 ENCOUNTER — Other Ambulatory Visit: Payer: Self-pay

## 2021-09-05 DIAGNOSIS — Z7982 Long term (current) use of aspirin: Secondary | ICD-10-CM | POA: Insufficient documentation

## 2021-09-05 DIAGNOSIS — F17210 Nicotine dependence, cigarettes, uncomplicated: Secondary | ICD-10-CM | POA: Insufficient documentation

## 2021-09-05 DIAGNOSIS — I10 Essential (primary) hypertension: Secondary | ICD-10-CM | POA: Diagnosis not present

## 2021-09-05 DIAGNOSIS — Z79899 Other long term (current) drug therapy: Secondary | ICD-10-CM | POA: Diagnosis not present

## 2021-09-05 DIAGNOSIS — T783XXA Angioneurotic edema, initial encounter: Secondary | ICD-10-CM | POA: Insufficient documentation

## 2021-09-05 DIAGNOSIS — Z96653 Presence of artificial knee joint, bilateral: Secondary | ICD-10-CM | POA: Insufficient documentation

## 2021-09-05 DIAGNOSIS — R221 Localized swelling, mass and lump, neck: Secondary | ICD-10-CM | POA: Diagnosis present

## 2021-09-05 DIAGNOSIS — R202 Paresthesia of skin: Secondary | ICD-10-CM | POA: Diagnosis not present

## 2021-09-05 LAB — CBC WITH DIFFERENTIAL/PLATELET
Abs Immature Granulocytes: 0.02 10*3/uL (ref 0.00–0.07)
Basophils Absolute: 0.1 10*3/uL (ref 0.0–0.1)
Basophils Relative: 1 %
Eosinophils Absolute: 0.2 10*3/uL (ref 0.0–0.5)
Eosinophils Relative: 3 %
HCT: 46.7 % (ref 39.0–52.0)
Hemoglobin: 15.6 g/dL (ref 13.0–17.0)
Immature Granulocytes: 0 %
Lymphocytes Relative: 27 %
Lymphs Abs: 1.9 10*3/uL (ref 0.7–4.0)
MCH: 31.6 pg (ref 26.0–34.0)
MCHC: 33.4 g/dL (ref 30.0–36.0)
MCV: 94.5 fL (ref 80.0–100.0)
Monocytes Absolute: 0.6 10*3/uL (ref 0.1–1.0)
Monocytes Relative: 8 %
Neutro Abs: 4.2 10*3/uL (ref 1.7–7.7)
Neutrophils Relative %: 61 %
Platelets: 302 10*3/uL (ref 150–400)
RBC: 4.94 MIL/uL (ref 4.22–5.81)
RDW: 12.4 % (ref 11.5–15.5)
WBC: 6.9 10*3/uL (ref 4.0–10.5)
nRBC: 0 % (ref 0.0–0.2)

## 2021-09-05 LAB — BASIC METABOLIC PANEL
Anion gap: 10 (ref 5–15)
BUN: 15 mg/dL (ref 8–23)
CO2: 26 mmol/L (ref 22–32)
Calcium: 9.5 mg/dL (ref 8.9–10.3)
Chloride: 100 mmol/L (ref 98–111)
Creatinine, Ser: 0.89 mg/dL (ref 0.61–1.24)
GFR, Estimated: >60 mL/min (ref >60)
Glucose, Bld: 157 mg/dL — ABNORMAL HIGH (ref 70–99)
Potassium: 3.2 mmol/L — ABNORMAL LOW (ref 3.5–5.1)
Sodium: 136 mmol/L (ref 135–145)

## 2021-09-05 NOTE — ED Triage Notes (Signed)
Pt and family report R upper lip swelling and tingling onset 1100 today. Not having numbness or weakness anywhere else. Hx stroke earlier this month.

## 2021-09-05 NOTE — ED Provider Notes (Signed)
[facility] EMERGENCY DEPARTMENT Provider Note   CSN: [id number] Arrival date & time: 09/05/21  1139     History No chief complaint on file.   Edward Thornton is a 75 y.o. male with history of recent stroke on 9/11 who presents to the emergency department for lip swelling that began 3 hours ago.  He states he had a similar episode on Sunday which resolved spontaneously.  Symptoms returned today and has been constant since onset.  Spoke with his neurologist who instructed him to come to the emergency department for a CT head given his most recent stroke. He states this feels different than his stroke symptoms as he presented at that time with left sided paralysis and weakness. He complains of swelling and tingliness to the upper lip.  Nothing seems to make it better or worse.  He was able to eat breakfast this morning and has been tolerating p.o. adequately since onset.  He denies any trouble breathing, trouble swallowing, trouble talking, weakness/numbness in upper or lower extremity, abdominal pain, nausea, vomiting, diarrhea, and chest pain.  He does take losartan for blood pressure.  He has been taking this for a long time.  No similar symptoms in the past. Denies any trauma or injury to the lip. Does not recall getting stung.     The history is provided by the patient. No language interpreter was used.      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   Hyperlipidemia    Hypertension     Patient Active Problem List   Diagnosis Date Noted   Acute ischemic stroke ([place]) 08/20/2021   Bilateral primary osteoarthritis of knee 06/01/2021   Tobacco abuse 09/19/2018   Routine general medical examination at a health care facility 08/24/2016   BPV (benign positional vertigo) 08/02/2016   Essential hypertension 08/26/2015   Hyperlipidemia 08/26/2015    Past Surgical History:  Procedure Laterality Date   adominal hernia   1990   HERNIA REPAIR Left 12/10/1997   KNEE ARTHROSCOPY  1990   right   KNEE ARTHROSCOPY  2010   left   PILONIDAL CYST EXCISION  1980       Family History  Problem Relation Age of Onset   Stroke Mother    Heart disease Mother    Stroke Father    Heart disease Father    Colon cancer Neg Hx    Stomach cancer Neg Hx     Social History   Tobacco Use   Smoking status: Every Day    Packs/day: 1.00    Years: 50.00    Pack years: 50.00    Types: Cigarettes    Start date: 12/10/1964   Smokeless tobacco: Never  Vaping Use   Vaping Use: Never used  Substance Use Topics   Alcohol use: Yes    Alcohol/week: 3.0 standard drinks    Types: 3 Cans of beer per week    Comment: drinks beer; 3 or 4 a week    Drug use: No    Home Medications Prior to Admission medications   Medication Sig Start Date End Date Taking? Authorizing Provider  acetaminophen (TYLENOL) 500 MG tablet Take 1,000 mg by mouth every 6 (six) hours as needed for mild pain.    [provider]  amLODipine (NORVASC) 10 MG tablet TAKE 1 TABLET BY MOUTH  DAILY Patient taking differently: Take 10 mg by mouth daily. 11/29/20  Hoyt Koch, MD  aspirin 81 MG tablet Take 81 mg by mouth daily.    [provider]  atorvastatin (LIPITOR) 40 MG tablet Take 1 tablet (40 mg total) by mouth daily. 08/22/21   Dennison Mascot, PA-C  cholecalciferol (VITAMIN D) 1000 units tablet Take 1 tablet (1,000 Units total) by mouth 2 (two) times daily. Takes 2 tablets twice daily Patient taking differently: Take 2,000 Units by mouth 2 (two) times daily. 01/18/16   Hoyt Koch, MD  clopidogrel (PLAVIX) 75 MG tablet Take 1 tablet (75 mg total) by mouth daily. 08/22/21   Dennison Mascot, PA-C  dimenhyDRINATE (DRAMAMINE) 50 MG tablet Take 50 mg by mouth every 8 (eight) hours as needed for dizziness.    [provider]  hydrochlorothiazide (HYDRODIURIL) 25 MG tablet TAKE 1 TABLET BY MOUTH  DAILY Patient  taking differently: Take 25 mg by mouth daily. 11/29/20   Hoyt Koch, MD  Menthol-Methyl Salicylate (SALONPAS PAIN RELIEF PATCH EX) Apply 1 patch topically daily as needed (knee pain).    [provider]  naproxen sodium (ALEVE) 220 MG tablet Take 220 mg by mouth daily as needed (pain).    [provider]  Omega-3 Fatty Acids (FISH OIL) 1200 MG CAPS Take 1 capsule (1,200 mg total) by mouth daily. Take 1 capsule daily Patient taking differently: Take 1,000 mg by mouth daily. 01/18/16   Hoyt Koch, MD  Turmeric 500 MG TABS Take 500 mg by mouth daily.    [provider]    Allergies    Losartan  Review of Systems   Review of Systems  All other systems reviewed and are negative.  Physical Exam Updated Vital Signs BP (!) 154/67   Pulse 82   Temp (!) 97.5 F (36.4 C) (Oral)   Resp 16   SpO2 96%   Physical Exam Constitutional:      General: He is not in acute distress.    Appearance: Normal appearance.  HENT:     Head: Normocephalic and atraumatic.     Comments: No facial droop.    Mouth/Throat:     Comments: Left upper lip is moderately swollen.  Lower lip is normal.  No pharyngeal erythema or edema.  Tongue is normal not swollen.  Uvula is midline.  No evidence of erythema or edema.  Tonsils are normal.  Oral mucosa is moist.  Able to speak in complete sentences. Eyes:     General:        Right eye: No discharge.        Left eye: No discharge.  Cardiovascular:     Comments: Regular rate and rhythm.  S1/S2 are distinct without any evidence of murmur, rubs, or gallops.  Radial pulses are 2+ bilaterally.  Dorsalis pedis pulses are 2+ bilaterally.  No evidence of pedal edema. Pulmonary:     Comments: Clear to auscultation bilaterally.  Normal effort.  No respiratory distress.  No evidence of wheezes, rales, or rhonchi heard throughout. Abdominal:     General: Abdomen is flat. Bowel sounds are normal. There is no distension.      Tenderness: There is no abdominal tenderness. There is no guarding or rebound.  Musculoskeletal:        General: Normal range of motion.     Cervical back: Neck supple.  Skin:    General: Skin is warm and dry.     Findings: No rash.  Neurological:     General: No focal deficit present.  Mental Status: He is alert.     Comments: Cranial nerves II through XII are intact.  5/5 strength to the upper and lower extremities.  Normal sensation to the upper and lower extremities.  No dysdiadochokinesia on rapid altering movements.  Normal point-to-point.  Normal gait.  Negative Romberg.  Negative pronator drift.  Psychiatric:        Mood and Affect: Mood normal.        Behavior: Behavior normal.    ED Results / Procedures / Treatments   Labs (all labs ordered are listed, but only abnormal results are displayed) Labs Reviewed  BASIC METABOLIC PANEL - Abnormal; Notable for the following components:      Result Value   Potassium 3.2 (*)    Glucose, Bld 157 (*)    All other components within normal limits  CBC WITH DIFFERENTIAL/PLATELET    EKG None  Radiology CT HEAD WO CONTRAST (5MM)  Result Date: 09/05/2021 CLINICAL DATA:  Tingling and swelling of upper lip EXAM: CT HEAD WITHOUT CONTRAST TECHNIQUE: Contiguous axial images were obtained from the base of the skull through the vertex without intravenous contrast. COMPARISON:  Brain MRI 08/22/2021, CT head 08/20/2021 FINDINGS: Brain: There is no evidence of acute intracranial hemorrhage, extra-axial fluid collection, or acute infarct. The ventricles are stable in size. There is no mass lesion. There is no midline shift. 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 Vascular: There is calcification of the bilateral cavernous ICAs. Skull: Normal. Negative for fracture or focal lesion. Sinuses/Orbits: The imaged paranasal sinuses are clear. Bilateral lens implants are in place. The globes and orbits are otherwise unremarkable.  Other: None. IMPRESSION: Stable appearance of the head with no acute intracranial pathology. Electronically Signed   By: Valetta Mole M.D.   On: 09/05/2021 13:53    Procedures Procedures   Medications Ordered in ED Medications - No data to display  ED Course  I have reviewed the triage vital signs and the nursing notes.  Pertinent labs & imaging results that were available during my care of the patient were reviewed by me and considered in my medical decision making (see chart for details).  Clinical Course as of 09/05/21 1534  Tue Sep 05, 2021  1516 I discussed this case with my attending physician who cosigned this note including patient's presenting symptoms, physical exam, and planned diagnostics and interventions. Attending physician stated agreement with plan or made changes to plan which were implemented.   Attending physician assessed patient at bedside.   [CF]    Clinical Course User Index [CF] Cherrie Gauze   MDM Rules/Calculators/A&P                          Edward Thornton is a 75 y.o. male with history of CVA who presents the [facility] for further evaluation of lip swelling.  Given history and physical exam was a concern for recurrence of his stroke.  Neuro exam is reassuring on my evaluation.  CT head was also negative.  CBC and CMP are within normal limits.  Given the clinical scenario, this is likely angioedema.  It is uncertain the cause however I am suspicious that losartan could be contributing.  I will have him hold this until he follows up with his primary care provider.  He has no clinical signs of airway compromise at this time.  He has been in the department for 3-1/2 hours and has had no worsening  of his symptoms. Strict return precautions given. Will have him follow up with his primary care provider within the next week for further evaluation.   Final Clinical Impression(s) / ED Diagnoses Final diagnoses:  Angioedema, initial encounter     Rx / DC Orders ED Discharge Orders     None        Cherrie Gauze 09/05/21 1535    Sherwood Gambler, MD 09/08/21 [phone]

## 2021-09-05 NOTE — Telephone Encounter (Signed)
Called patient and reviewed Edward Thornton's recommendation with patient.   Patient did not want to go to the ED for evaluation as he said this occurred Sunday and he had no weakness, headache, vision change, balance issues.  His wife could hear the conversation and she said that he just experienced swelling on his right lower lip and it went away by that evening.    I reinforced to patient that with previous stroke history, he should not play a wait and see game.  He needs emergent evaluation for changes.    Patient denied further questions, verbalized understanding and expressed appreciation for the phone call.

## 2021-09-05 NOTE — Discharge Instructions (Addendum)
You were seen and evaluated in the emergency department for lip swelling.  As we discussed, this is likely angioedema secondary to your losartan.  I have listed this as an allergy on your medications.  Please do not continue taking losartan until you follow-up with your primary care provider.  Please return to the emergency department if you are experiencing worsening lip swelling, trouble breathing, trouble swallowing, trouble talking, or any other concerns you might have.  Please follow-up with your primary care provider within the next week for further evaluation.

## 2021-09-05 NOTE — ED Provider Notes (Signed)
Emergency Medicine Provider Triage Evaluation Note  Edward Thornton , a 75 y.o. male  was evaluated in triage.  Pt complains of right upper lip swelling x3 hours today.  Patient with multiple episodes similar to this over the weekend.  Of note patient was started on aspirin and Plavix on 9/11 for ischemic stroke and had no symptoms similar to today's prior to stroke.  Chronically on losartan.  Review of Systems  Positive: Right upper lip swelling Negative: Tongue swelling, difficulty swallowing, shortness of breath, nausea, vomiting  Physical Exam  BP (!) 154/67   Pulse 82   Temp (!) 97.5 F (36.4 C) (Oral)   Resp 16   SpO2 96%  Gen:   Awake, no distress   Resp:  Normal effort  MSK:   Moves extremities without difficulty  Other:  Mild edema of the right upper lip without lingual edema or deviation, no sublingual or submental tenderness palpation.  No anterior neck swelling or crepitus  Medical Decision Making  Medically screening exam initiated at 12:04 PM.  Appropriate orders placed.  Edward Thornton was informed that the remainder of the evaluation will be completed by another provider, this initial triage assessment does not replace that evaluation, and the importance of remaining in the ED until their evaluation is complete.  Swelling of the lip and reported HPI multiple episodes of this over the weekend that self resolved more consistent with angioedema, on losartan chronically.  However patient did speak with his neurology office this morning and neurologist recommended follow-up CT scan in the ED given recent stroke.  This has been ordered.  This chart was dictated using voice recognition software, Dragon. Despite the best efforts of this provider to proofread and correct errors, errors may still occur which can change documentation meaning.    Aura Dials 09/05/21 1206    Daleen Bo, MD 09/05/21 [phone]

## 2021-09-08 ENCOUNTER — Other Ambulatory Visit: Payer: Self-pay

## 2021-09-08 ENCOUNTER — Ambulatory Visit (INDEPENDENT_AMBULATORY_CARE_PROVIDER_SITE_OTHER): Payer: Medicare Other | Admitting: Nurse Practitioner

## 2021-09-08 ENCOUNTER — Encounter: Payer: Self-pay | Admitting: Nurse Practitioner

## 2021-09-08 VITALS — BP 138/60 | HR 89 | Temp 98.0°F | Resp 18 | Ht 72.0 in | Wt 182.6 lb

## 2021-09-08 DIAGNOSIS — T783XXD Angioneurotic edema, subsequent encounter: Secondary | ICD-10-CM | POA: Diagnosis not present

## 2021-09-08 NOTE — Progress Notes (Signed)
Subjective:  Patient ID: Edward Thornton, male    DOB: [birth year]  Age: 75 y.o. MRN: [id number]  CC:  Chief Complaint  Patient presents with   Reaction to BP med & ED F/U    Pt stated that he had swelling to his mouth. He stated that he feels swelling across his top lip. He has been taking otc benadryl.      HPI  This patient arrives today for the above.  He was seen in the emergency department approximately 3 days ago.  He went there because he was having some lip swelling and paresthesias to the lip.  Ultimately they decided that he probably was experiencing angioedema which I think was from losartan.  He has been on losartan for years and has tolerated it well.  He also had a stroke earlier this month on the 11th, he was treated with tPA.  He denies any residual effects.  His stroke started with facial numbness and left-sided facial droop so he was concerned that the lip swelling and numbness he was experiencing was another stroke.  He does have a neurologist which he is scheduled to follow-up with in a month or so.  He has been holding his losartan since the last 3 days but he is still having intermittent swelling of his upper lip.  He denies any tongue swelling, throat swelling, or shortness of breath.  He does take Benadryl as needed and when he takes the Benadryl it seems to relieve the swelling.  He is also on amlodipine and hydrochlorothiazide at home for blood pressure.  He checks his blood pressure at home and tells me this morning it was around 242 systolically.  He denies any other or new exposures to new substances as far as he knows.  He does continue to smoke and tells me he did smoke a cigarette or 2 earlier today.  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   Hyperlipidemia    Hypertension       Family History  Problem Relation Age of Onset   Stroke Mother    Heart disease Mother    Stroke Father    Heart disease  Father    Colon cancer Neg Hx    Stomach cancer Neg Hx     Social History   Social History Narrative   Not on file   Social History   Tobacco Use   Smoking status: Every Day    Packs/day: 1.00    Years: 50.00    Pack years: 50.00    Types: Cigarettes    Start date: 12/10/1964   Smokeless tobacco: Never  Substance Use Topics   Alcohol use: Yes    Alcohol/week: 3.0 standard drinks    Types: 3 Cans of beer per week    Comment: drinks beer; 3 or 4 a week      Current Meds  Medication Sig   acetaminophen (TYLENOL) 500 MG tablet Take 1,000 mg by mouth every 6 (six) hours as needed for mild pain.   amLODipine (NORVASC) 10 MG tablet TAKE 1 TABLET BY MOUTH  DAILY (Patient taking differently: Take 10 mg by mouth daily.)   aspirin 81 MG tablet Take 81 mg by mouth daily.   atorvastatin (LIPITOR) 40 MG tablet Take 1 tablet (40 mg total) by mouth daily.   cholecalciferol (VITAMIN D) 1000 units tablet Take 1 tablet (1,000 Units total) by mouth  2 (two) times daily. Takes 2 tablets twice daily (Patient taking differently: Take 2,000 Units by mouth 2 (two) times daily.)   clopidogrel (PLAVIX) 75 MG tablet Take 1 tablet (75 mg total) by mouth daily.   dimenhyDRINATE (DRAMAMINE) 50 MG tablet Take 50 mg by mouth every 8 (eight) hours as needed for dizziness.   hydrochlorothiazide (HYDRODIURIL) 25 MG tablet TAKE 1 TABLET BY MOUTH  DAILY (Patient taking differently: Take 25 mg by mouth daily.)   Menthol-Methyl Salicylate (SALONPAS PAIN RELIEF PATCH EX) Apply 1 patch topically daily as needed (knee pain).   naproxen sodium (ALEVE) 220 MG tablet Take 220 mg by mouth daily as needed (pain).   Omega-3 Fatty Acids (FISH OIL) 1200 MG CAPS Take 1 capsule (1,200 mg total) by mouth daily. Take 1 capsule daily (Patient taking differently: Take 1,000 mg by mouth daily.)   Turmeric 500 MG TABS Take 500 mg by mouth daily.    ROS:  Review of Systems  Respiratory:  Negative for shortness of breath.    Cardiovascular:  Negative for chest pain.  See HPI for additional review of systems   Objective:   Today's Vitals: BP 138/60   Pulse 89   Temp 98 F (36.7 C) (Oral)   Resp 18   Ht 6' (1.829 m)   Wt 182 lb 9.6 oz (82.8 kg)   SpO2 93%   BMI 24.77 kg/m  Vitals with BMI 09/08/2021 09/08/2021 09/05/2021  Height - 6\' 0"  -  Weight - 182 lbs 10 oz -  BMI - 26.20 -  Systolic 355 974 163  Diastolic 60 60 68  Pulse - 89 64     Physical Exam Vitals reviewed.  Constitutional:      Appearance: Normal appearance.  HENT:     Head: Normocephalic and atraumatic.     Mouth/Throat:     Lips: No lesions.     Comments: No obvious swelling or facial asymmetry noted Cardiovascular:     Rate and Rhythm: Normal rate and regular rhythm.  Pulmonary:     Effort: Pulmonary effort is normal.     Breath sounds: Normal breath sounds.  Musculoskeletal:     Cervical back: Neck supple.  Skin:    General: Skin is warm and dry.  Neurological:     Mental Status: He is alert and oriented to person, place, and time.  Psychiatric:        Mood and Affect: Mood normal.        Behavior: Behavior normal.        Thought Content: Thought content normal.        Judgment: Judgment normal.         Assessment and Plan   1. Angioedema, subsequent encounter      Plan: 1.  I do agree that his symptoms do sound like an allergic reaction especially because the Benadryl improved symptoms when he takes it.  For now I have told him to continue holding the losartan we did discuss that if he has any worsening lip swelling, tongue swelling, throat swelling, or difficulty breathing he needs to call 911.  He tells me he understands.  Also refer him to an allergist to see if there may be a different allergen that could explain his symptoms.  I have encouraged him to try to quit smoking.  I did repeat his blood pressure today and it was slightly higher than the CMA got, he is very concerned about getting his blood  pressure well controlled  especially in light of his recent stroke.  I do not monitor too many changes in his medications at him currently just because of his current allergic-like reactions.  I will have him come back to the office in about 1 week for blood pressure check and may consider adding a beta-blocker to her regimen if needed for tighter blood pressure control.  He was encouraged to check his blood pressure once a day at home and keep a log of this and bring it to his next office visit.  He was also encouraged to bring his at home blood pressure machine so we can calibrate it at next office visit.  He tells me he understands.   Tests ordered Orders Placed This Encounter  Procedures   Ambulatory referral to Allergy      No orders of the defined types were placed in this encounter.   Patient to follow-up in I week for BP check  Ailene Ards, NP

## 2021-09-15 ENCOUNTER — Other Ambulatory Visit: Payer: Self-pay

## 2021-09-15 ENCOUNTER — Ambulatory Visit (INDEPENDENT_AMBULATORY_CARE_PROVIDER_SITE_OTHER): Payer: Medicare Other | Admitting: Nurse Practitioner

## 2021-09-15 ENCOUNTER — Other Ambulatory Visit: Payer: Self-pay | Admitting: Nurse Practitioner

## 2021-09-15 VITALS — BP 156/70 | HR 73 | Temp 98.0°F | Ht 72.0 in | Wt 183.0 lb

## 2021-09-15 DIAGNOSIS — I10 Essential (primary) hypertension: Secondary | ICD-10-CM | POA: Diagnosis not present

## 2021-09-15 DIAGNOSIS — E876 Hypokalemia: Secondary | ICD-10-CM

## 2021-09-15 DIAGNOSIS — L509 Urticaria, unspecified: Secondary | ICD-10-CM | POA: Diagnosis not present

## 2021-09-15 LAB — BASIC METABOLIC PANEL
BUN: 18 mg/dL (ref 6–23)
CO2: 31 mEq/L (ref 19–32)
Calcium: 9.5 mg/dL (ref 8.4–10.5)
Chloride: 101 mEq/L (ref 96–112)
Creatinine, Ser: 0.86 mg/dL (ref 0.40–1.50)
GFR: 85.27 mL/min (ref >60.00)
Glucose, Bld: 121 mg/dL — ABNORMAL HIGH (ref 70–99)
Potassium: 3.1 mEq/L — ABNORMAL LOW (ref 3.5–5.1)
Sodium: 140 mEq/L (ref 135–145)

## 2021-09-15 MED ORDER — HYDRALAZINE HCL 10 MG PO TABS: 10.0000 mg | ORAL_TABLET | Freq: Two times a day (BID) | ORAL | 1 refills | Status: DC

## 2021-09-15 MED ORDER — POTASSIUM CHLORIDE CRYS ER 20 MEQ PO TBCR: 20.0000 meq | EXTENDED_RELEASE_TABLET | Freq: Every day | ORAL | 1 refills | Status: DC

## 2021-09-15 NOTE — Progress Notes (Signed)
Starting patient on k-dur supplement for hypokalemia, just FYI. Ordered 4meq/day. Let me know if you think this dose needs to be changed. He follows up with you on 09/25/21 for BP check, maybe consider rechecking potassium level at that time as well.

## 2021-09-15 NOTE — Progress Notes (Signed)
Sounds good or we could consider changing hctz 25 to triamterene/hctz instead. I can always switch it at next visit after talking to him.

## 2021-09-15 NOTE — Progress Notes (Signed)
Subjective:  Patient ID: Edward Thornton, male    DOB: 1946-09-30  Age: 75 y.o. MRN: [id number]  CC:  Chief Complaint  Patient presents with   Hypertension      HPI  This patient arrives today for the above.  He was seen last week for ER hospital follow-up.  He was seen in the ER for angioedema and they feel that it was from losartan.  He continues to stay off the losartan but continues on amlodipine and hydrochlorothiazide.  He was concerned about his blood pressure at last office visit especially considering he had a recent stroke.  We are here today to determine if he needs an additional agent added.  He tells me he does continue to have intermittent swelling of his lips but no swelling of tongue or throat.  He is also starting to experience hives as well.  He was referred to an allergist at last visit and tells me he has an appointment scheduled this upcoming Tuesday.  He has been checking his blood pressure at home and does bring a log today.  Systolic blood pressure has mainly been in the upper 712W with diastolic pressure in the 58K to 80s.  Pulse is anywhere between 59-78.  Of note he did have mild hypokalemia when he was evaluated in the hospital with a potassium of 3.2.  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   Hyperlipidemia    Hypertension       Family History  Problem Relation Age of Onset   Stroke Mother    Heart disease Mother    Stroke Father    Heart disease Father    Colon cancer Neg Hx    Stomach cancer Neg Hx     Social History   Social History Narrative   Not on file   Social History   Tobacco Use   Smoking status: Every Day    Packs/day: 1.00    Years: 50.00    Pack years: 50.00    Types: Cigarettes    Start date: 12/10/1964   Smokeless tobacco: Never  Substance Use Topics   Alcohol use: Yes    Alcohol/week: 3.0 standard drinks    Types: 3 Cans of beer per week    Comment: drinks beer;  3 or 4 a week      Current Meds  Medication Sig   acetaminophen (TYLENOL) 500 MG tablet Take 1,000 mg by mouth every 6 (six) hours as needed for mild pain.   amLODipine (NORVASC) 10 MG tablet TAKE 1 TABLET BY MOUTH  DAILY (Patient taking differently: Take 10 mg by mouth daily.)   aspirin 81 MG tablet Take 81 mg by mouth daily.   atorvastatin (LIPITOR) 40 MG tablet Take 1 tablet (40 mg total) by mouth daily.   cholecalciferol (VITAMIN D) 1000 units tablet Take 1 tablet (1,000 Units total) by mouth 2 (two) times daily. Takes 2 tablets twice daily (Patient taking differently: Take 2,000 Units by mouth 2 (two) times daily.)   clopidogrel (PLAVIX) 75 MG tablet Take 1 tablet (75 mg total) by mouth daily.   dimenhyDRINATE (DRAMAMINE) 50 MG tablet Take 50 mg by mouth every 8 (eight) hours as needed for dizziness.   hydrALAZINE (APRESOLINE) 10 MG tablet Take 1 tablet (10 mg total) by mouth in the morning and at bedtime.   hydrochlorothiazide (HYDRODIURIL) 25 MG tablet TAKE 1 TABLET BY MOUTH  DAILY (Patient taking differently: Take 25 mg by mouth daily.)   Menthol-Methyl Salicylate (SALONPAS PAIN RELIEF PATCH EX) Apply 1 patch topically daily as needed (knee pain).   naproxen sodium (ALEVE) 220 MG tablet Take 220 mg by mouth daily as needed (pain).   Omega-3 Fatty Acids (FISH OIL) 1200 MG CAPS Take 1 capsule (1,200 mg total) by mouth daily. Take 1 capsule daily (Patient taking differently: Take 1,000 mg by mouth daily.)   Turmeric 500 MG TABS Take 500 mg by mouth daily.    ROS:  Review of Systems  Eyes:  Negative for blurred vision.  Cardiovascular:  Negative for chest pain.  Neurological:  Negative for headaches.  Also see HPI  Objective:   Today's Vitals: BP (!) 156/70 (BP Location: Right Arm, Patient Position: Sitting, Cuff Size: Large)   Pulse 73   Temp 98 F (36.7 C) (Oral)   Ht 6' (1.829 m)   Wt 183 lb (83 kg)   SpO2 96%   BMI 24.82 kg/m  Vitals with BMI 09/15/2021 09/08/2021  09/08/2021  Height 6\' 0"  - 6\' 0"   Weight 183 lbs - 182 lbs 10 oz  BMI 83.15 - 17.61  Systolic 607 371 062  Diastolic 70 60 60  Pulse 73 - 89     Physical Exam Vitals reviewed.  Constitutional:      Appearance: Normal appearance.  HENT:     Head: Normocephalic and atraumatic.  Cardiovascular:     Rate and Rhythm: Normal rate and regular rhythm.  Pulmonary:     Effort: Pulmonary effort is normal.     Breath sounds: Normal breath sounds.  Musculoskeletal:     Cervical back: Neck supple.  Skin:    General: Skin is warm and dry.  Neurological:     Mental Status: He is alert and oriented to person, place, and time.  Psychiatric:        Mood and Affect: Mood normal.        Behavior: Behavior normal.        Thought Content: Thought content normal.        Judgment: Judgment normal.         Assessment and Plan   1. Essential hypertension   2. Hypokalemia   3. Hives      Plan: 1.  Did consult with his primary care provider decision will be to start patient on low-dose of hydralazine twice a day.  He also continue on amlodipine and hydrochlorothiazide.  He will follow-up in 1 to 2 weeks to see how his blood pressure is in the make sure he is tolerating medication. 2.  We will check BMP to make sure potassium is improved. 3.  He continues to have symptoms of allergic reactions.  I encouraged him to make sure he follows up with his allergist next week to see if they can pinpoint what the allergen is as causing his symptoms.  He can continue taking Benadryl the meantime as needed as this seems to relieve the hives and the lip swelling.  He is also encouraged to use cold compress and to try to avoid itching if possible.  He tells me he understands.   Tests ordered Orders Placed This Encounter  Procedures   Basic Metabolic Panel (BMET)      Meds ordered this encounter  Medications   hydrALAZINE (APRESOLINE) 10 MG tablet    Sig: Take 1 tablet (10 mg total) by mouth in the  morning and at bedtime.  Dispense:  60 tablet    Refill:  1    Order Specific Question:   Supervising Provider    Answer:   Binnie Rail [id number]    Patient to follow-up in 1-2 weeks or sooner as needed.  Ailene Ards, NP

## 2021-09-16 NOTE — Progress Notes (Signed)
Whatever you would like to do is fine with me. I would offer to call him next week if we need to make changes, but I will be out on leave. So if you need me to I can call him this weekend. Feel free to let me know your preference.

## 2021-09-19 ENCOUNTER — Other Ambulatory Visit: Payer: Self-pay

## 2021-09-19 ENCOUNTER — Ambulatory Visit: Payer: Medicare Other | Admitting: Allergy and Immunology

## 2021-09-19 ENCOUNTER — Encounter: Payer: Self-pay | Admitting: Allergy and Immunology

## 2021-09-19 VITALS — BP 142/56 | HR 77 | Temp 98.0°F | Resp 20 | Ht 72.0 in | Wt 184.8 lb

## 2021-09-19 DIAGNOSIS — T781XXA Other adverse food reactions, not elsewhere classified, initial encounter: Secondary | ICD-10-CM

## 2021-09-19 DIAGNOSIS — L501 Idiopathic urticaria: Secondary | ICD-10-CM | POA: Diagnosis not present

## 2021-09-19 DIAGNOSIS — T783XXA Angioneurotic edema, initial encounter: Secondary | ICD-10-CM | POA: Diagnosis not present

## 2021-09-19 DIAGNOSIS — T50905A Adverse effect of unspecified drugs, medicaments and biological substances, initial encounter: Secondary | ICD-10-CM

## 2021-09-19 DIAGNOSIS — F17210 Nicotine dependence, cigarettes, uncomplicated: Secondary | ICD-10-CM

## 2021-09-19 DIAGNOSIS — T7840XA Allergy, unspecified, initial encounter: Secondary | ICD-10-CM

## 2021-09-19 MED ORDER — CETIRIZINE HCL 10 MG PO TABS: 10.0000 mg | ORAL_TABLET | Freq: Two times a day (BID) | ORAL | 5 refills | Status: DC

## 2021-09-19 MED ORDER — FAMOTIDINE 20 MG PO TABS: 20.0000 mg | ORAL_TABLET | Freq: Two times a day (BID) | ORAL | 5 refills | Status: DC

## 2021-09-19 NOTE — Patient Instructions (Addendum)
  1.  Blood - CBC w/d, CMP, TSH, FT4, Thyroid peroxidase, C4, alpha-gal panel  2.  Every day use the following:  A. Cetirizine 10 mg - 1 tablet 2 times per day B. Famotidine 20 mg - 1 tablet 2 times per day  3. Can add benadryl if needed  4.  Use nicotine replacements to eliminate tobacco smoke exposure  5.  Removal of Plavix???

## 2021-09-19 NOTE — Progress Notes (Signed)
[place] - [place] - [place] - [place] - [place]   Dear Dr. Sharlet Salina,  Thank you for referring Edward Thornton to the [facility] on 09/19/2021.   Below is a summation of this patient's evaluation and recommendations.  Thank you for your referral. I will keep you informed about this patient's response to treatment.   If you have any questions please do not hesitate to contact me.   Sincerely,  Jiles Prows, MD Allergy / Immunology [place] [facility]   ______________________________________________________________________    NEW PATIENT NOTE  Referring Provider: Hoyt Koch, * Primary Provider: Hoyt Koch, MD Date of office visit: 09/19/2021    Subjective:   Chief Complaint:  Edward Thornton (DOB: 1946-04-17) is a 75 y.o. male who presents to the clinic on 09/19/2021 with a chief complaint of Establish Care (Patient in today for recent episode of swelling and hives.) .     HPI: Edward Thornton presents to this clinic in evaluation of swelling and hives.  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  Within 12 days of starting Plavix he developed angioedema of his lips and subsequently developed red raised itchy lesions across his body that last several hours and never heal with scar or hyperpigmentation and are not associated with any systemic or constitutional symptoms.  He had his losartan discontinued as a possible culprit for this immune activation but he still continues to have urticaria on a pretty regular basis.  He has not started any new medications other than Plavix.  He does not use any over-the-counter supplements or health foods or energy boosters.  He has not started any new hobbies where he has exposure to particulate matter.  He does not have any symptoms to  suggest an ongoing infectious disease.  He has received 3 COVID vaccinations.  He smokes a few cigarettes per day and has a prolonged multi decade history of smoking as does his wife.  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   amLODipine 10 MG tablet Commonly known as: NORVASC TAKE 1 TABLET BY MOUTH  DAILY   atorvastatin 40 MG tablet Commonly known as: LIPITOR Take 1 tablet (40 mg total) by mouth daily.   cholecalciferol 1000 units tablet Commonly known as: VITAMIN D Take 1 tablet (1,000 Units total) by mouth 2 (two) times daily. Takes 2 tablets twice daily   clopidogrel 75 MG tablet Commonly known as: PLAVIX Take 1 tablet (75 mg total) by mouth daily.   dimenhyDRINATE 50 MG tablet Commonly known as: DRAMAMINE Take 50 mg by mouth every 8 (eight) hours as needed for dizziness.   Fish Oil 1200 MG  Caps Take 1 capsule (1,200 mg total) by mouth daily. Take 1 capsule daily   hydrALAZINE 10 MG tablet Commonly known as: APRESOLINE Take 1 tablet (10 mg total) by mouth in the morning and at bedtime.   hydrochlorothiazide 25 MG tablet Commonly known as: HYDRODIURIL TAKE 1 TABLET BY MOUTH  DAILY   naproxen sodium 220 MG tablet Commonly known as: ALEVE Take 220 mg by mouth daily as needed (pain).   potassium chloride SA 20 MEQ tablet Commonly known as: KLOR-CON Take 1 tablet (20 mEq total) by mouth daily.   SALONPAS PAIN RELIEF PATCH EX Apply 1 patch  topically daily as needed (knee pain).   Turmeric 500 MG Tabs Take 500 mg by mouth daily.    Review of systems negative except as noted in HPI / PMHx or noted below:  Review of Systems  Constitutional: Negative.   HENT: Negative.    Eyes: Negative.   Respiratory: Negative.    Cardiovascular: Negative.   Gastrointestinal: Negative.   Genitourinary: Negative.   Musculoskeletal: Negative.   Skin: Negative.   Neurological: Negative.   Endo/Heme/Allergies: Negative.   Psychiatric/Behavioral: Negative.     Family History  Problem Relation Age of Onset   Stroke Mother    Heart disease Mother    Stroke Father    Heart disease Father    Colon cancer Neg Hx    Stomach cancer Neg Hx     Social History   Socioeconomic History   Marital status: Married    Spouse name: Not on file   Number of children: 2   Years of education: Not on file   Highest education level: Not on file  Occupational History   Occupation: Insurance account manager, part-time/ retired  Tobacco Use   Smoking status: Every Day    Packs/day: 1.00    Years: 50.00    Pack years: 50.00    Types: Cigarettes    Start date: 12/10/1964   Smokeless tobacco: Never  Vaping Use   Vaping Use: Never used  Substance and Sexual Activity   Alcohol use: Yes    Alcohol/week: 3.0 standard drinks    Types: 3 Cans of beer per week    Comment: drinks beer; 3 or 4 a week    Drug use: No   Sexual activity: Yes  Other Topics Concern   Not on file  Social History Narrative   Not on file   Social Determinants of Health   Financial Resource Strain: Low Risk    Difficulty of Paying Living Expenses: Not hard at all  Food Insecurity: No Food Insecurity   Worried About Charity fundraiser in the Last Year: Never true   [place] in the Last Year: Never true  Transportation Needs: No Transportation Needs   Lack of Transportation (Medical): No   Lack of Transportation (Non-Medical): No  Physical Activity: Sufficiently Active    Days of Exercise per Week: 5 days   Minutes of Exercise per Session: 30 min  Stress: No Stress Concern Present   Feeling of Stress : Not at all  Social Connections: Socially Integrated   Frequency of Communication with Friends and Family: More than three times a week   Frequency of Social Gatherings with Friends and Family: More than three times a week   Attends Religious Services: More than 4 times per year   Active Member of [employer] or Organizations: Yes   Attends Archivist Meetings: More than 4 times per year  Marital Status: Married  Human resources officer Violence: Not on Arboriculturist and Social history  Lives in a house with a dry environment, no animals located inside the household, carpet in the bedroom, no plastic on the bed, no plastic on the pillow, and actively smoking tobacco products.  Objective:   Vitals:   09/19/21 1001  BP: (!) 142/56  Pulse: 77  Resp: 20  Temp: 98 F (36.7 C)  SpO2: 97%   Height: 6' (182.9 cm) Weight: 184 lb 12.8 oz (83.8 kg)  Physical Exam Constitutional:      Appearance: He is not diaphoretic.  HENT:     Head: Normocephalic.     Right Ear: Tympanic membrane, ear canal and external ear normal.     Left Ear: Tympanic membrane, ear canal and external ear normal.     Nose: Nose normal. No mucosal edema or rhinorrhea.     Mouth/Throat:     Pharynx: Uvula midline. No oropharyngeal exudate.  Eyes:     Conjunctiva/sclera: Conjunctivae normal.  Neck:     Thyroid: No thyromegaly.     Trachea: Trachea normal. No tracheal tenderness or tracheal deviation.  Cardiovascular:     Rate and Rhythm: Normal rate and regular rhythm.     Heart sounds: Normal heart sounds, S1 normal and S2 normal. No murmur heard. Pulmonary:     Effort: No respiratory distress.     Breath sounds: Normal breath sounds. No stridor. No wheezing or rales.  Lymphadenopathy:     Head:     Right side of head: No tonsillar adenopathy.     Left side of head:  No tonsillar adenopathy.     Cervical: No cervical adenopathy.  Skin:    Findings: Rash (Blanching urticarial lesion left lower extremity) present. No erythema.     Nails: There is no clubbing.  Neurological:     Mental Status: He is alert.    Diagnostics: Allergy skin tests were not performed.   Results of blood tests obtained 05 September 2021 identifies WBC 6.9, absolute eosinophil 200, absolute basophil 100, absolute lymphocyte 1900, hemoglobin 15.6, platelet 302, creatinine 0.89 mg/DL  Results of blood tests obtained 20 August 2021 identifies AST 17 U/L, ALT 13 U/L  Assessment and Plan:    1. Adverse drug effect, initial encounter   2. Allergic reaction, initial encounter   3. Angioedema, initial encounter   4. Idiopathic urticaria   5. Heavy tobacco smoker >10 cigarettes per day     1.  Blood - CBC w/d, CMP, TSH, FT4, Thyroid peroxidase, C4, alpha-gal panel  2.  Every day use the following:  A. Cetirizine 10 mg - 1 tablet 2 times per day B. Famotidine 20 mg - 1 tablet 2 times per day  3. Can add benadryl if needed  4.  Use nicotine replacements to eliminate tobacco smoke exposure  5.  Removal of Plavix???  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  However, there is a strong temporal relationship between his exposure to tissue plasminogen activator and Plavix and the onset of this immune activation.  I think the question will come up at some point in the near future whether or not his Plavix needs to be removed.  If he has good control of this issue while using an  H1 and H2 receptor blocker then there could be an argument made that he could remain on Plavix but certainly if he has breakthrough problems on this plan then he probably needs to come off of his Plavix  and be replaced with some other agent suggested by his neurologist to minimize his risk of redeveloping a occlusive central nervous system event.  I also had a talk with him today about using nicotine replacements to eliminate his tobacco smoke exposure and both he and his wife seem to be quite receptive to that conversation.  I will contact him with the results of his blood test once they are available for review.  Jiles Prows, MD Allergy / Immunology [facility] [place]

## 2021-09-25 ENCOUNTER — Ambulatory Visit (INDEPENDENT_AMBULATORY_CARE_PROVIDER_SITE_OTHER): Payer: Medicare Other | Admitting: Internal Medicine

## 2021-09-25 ENCOUNTER — Other Ambulatory Visit: Payer: Self-pay

## 2021-09-25 ENCOUNTER — Encounter: Payer: Self-pay | Admitting: Internal Medicine

## 2021-09-25 DIAGNOSIS — L509 Urticaria, unspecified: Secondary | ICD-10-CM

## 2021-09-25 DIAGNOSIS — Z720 Tobacco use: Secondary | ICD-10-CM

## 2021-09-25 DIAGNOSIS — Z8673 Personal history of transient ischemic attack (TIA), and cerebral infarction without residual deficits: Secondary | ICD-10-CM | POA: Diagnosis not present

## 2021-09-25 NOTE — Progress Notes (Signed)
   Subjective:   Patient ID: Edward Thornton, male    DOB: August 31, 1946, 75 y.o.   MRN: [id number]  HPI The patient is a 75 YO man coming in for follow up hives and lip swelling. Undergoing workup through allergy. Did take pepcid and zyrtec for a few days which helped and made her sedated so he stopped. Still without hives. Minimal itching.   Review of Systems  Constitutional:  Positive for activity change.  HENT: Negative.    Eyes: Negative.   Respiratory:  Negative for cough, chest tightness and shortness of breath.   Cardiovascular:  Negative for chest pain, palpitations and leg swelling.  Gastrointestinal:  Negative for abdominal distention, abdominal pain, constipation, diarrhea, nausea and vomiting.  Musculoskeletal: Negative.   Skin: Negative.   Neurological: Negative.   Psychiatric/Behavioral: Negative.     Objective:  Physical Exam Constitutional:      Appearance: He is well-developed.  HENT:     Head: Normocephalic and atraumatic.  Cardiovascular:     Rate and Rhythm: Normal rate and regular rhythm.  Pulmonary:     Effort: Pulmonary effort is normal. No respiratory distress.     Breath sounds: Normal breath sounds. No wheezing or rales.  Abdominal:     General: Bowel sounds are normal. There is no distension.     Palpations: Abdomen is soft.     Tenderness: There is no abdominal tenderness. There is no rebound.  Musculoskeletal:     Cervical back: Normal range of motion.  Skin:    General: Skin is warm and dry.  Neurological:     Mental Status: He is alert and oriented to person, place, and time.     Coordination: Coordination normal.    Vitals:   09/25/21 1556  BP: 140/72  Pulse: 75  Resp: 18  SpO2: 93%  Weight: 183 lb 6.4 oz (83.2 kg)  Height: 6' (1.829 m)    This visit occurred during the SARS-CoV-2 public health emergency.  Safety protocols were in place, including screening questions prior to the visit, additional usage of staff PPE, and extensive  cleaning of exam room while observing appropriate contact time as indicated for disinfecting solutions.   Assessment & Plan:  Visit time 20 minutes in face to face communication with patient and coordination of care, additional 15 minutes spent in record review, coordination or care, ordering tests, communicating/referring to other healthcare professionals, documenting in medical records all on the same day of the visit for total time 35 minutes spent on the visit.

## 2021-09-25 NOTE — Patient Instructions (Addendum)
Work on stopping smoking.  We will wait on the labs from the allergy doctor to see if we need to make any changes.

## 2021-09-26 ENCOUNTER — Encounter: Payer: Self-pay | Admitting: Adult Health

## 2021-09-26 ENCOUNTER — Ambulatory Visit: Payer: Medicare Other | Admitting: Adult Health

## 2021-09-26 VITALS — BP 132/64 | HR 68 | Ht 72.0 in | Wt 185.0 lb

## 2021-09-26 DIAGNOSIS — E785 Hyperlipidemia, unspecified: Secondary | ICD-10-CM | POA: Diagnosis not present

## 2021-09-26 DIAGNOSIS — G459 Transient cerebral ischemic attack, unspecified: Secondary | ICD-10-CM

## 2021-09-26 DIAGNOSIS — I10 Essential (primary) hypertension: Secondary | ICD-10-CM

## 2021-09-26 DIAGNOSIS — Z720 Tobacco use: Secondary | ICD-10-CM

## 2021-09-26 LAB — TSH: TSH: 1.59 u[IU]/mL (ref 0.450–4.500)

## 2021-09-26 LAB — CBC WITH DIFFERENTIAL
Basophils Absolute: 0 10*3/uL (ref 0.0–0.2)
Basos: 1 %
EOS (ABSOLUTE): 0.1 10*3/uL (ref 0.0–0.4)
Eos: 2 %
Hematocrit: 46.8 % (ref 37.5–51.0)
Hemoglobin: 15.8 g/dL (ref 13.0–17.7)
Immature Grans (Abs): 0 10*3/uL (ref 0.0–0.1)
Immature Granulocytes: 0 %
Lymphocytes Absolute: 1.4 10*3/uL (ref 0.7–3.1)
Lymphs: 24 %
MCH: 31.7 pg (ref 26.6–33.0)
MCHC: 33.8 g/dL (ref 31.5–35.7)
MCV: 94 fL (ref 79–97)
Monocytes Absolute: 0.5 10*3/uL (ref 0.1–0.9)
Monocytes: 8 %
Neutrophils Absolute: 4 10*3/uL (ref 1.4–7.0)
Neutrophils: 65 %
RBC: 4.99 x10E6/uL (ref 4.14–5.80)
RDW: 12.6 % (ref 11.6–15.4)
WBC: 6.1 10*3/uL (ref 3.4–10.8)

## 2021-09-26 LAB — COMPREHENSIVE METABOLIC PANEL
ALT: 12 IU/L (ref 0–44)
AST: 15 IU/L (ref 0–40)
Albumin/Globulin Ratio: 2 (ref 1.2–2.2)
Albumin: 4.4 g/dL (ref 3.7–4.7)
Alkaline Phosphatase: 83 IU/L (ref 44–121)
BUN/Creatinine Ratio: 15 (ref 10–24)
BUN: 12 mg/dL (ref 8–27)
Bilirubin Total: 0.7 mg/dL (ref 0.0–1.2)
CO2: 21 mmol/L (ref 20–29)
Calcium: 9.5 mg/dL (ref 8.6–10.2)
Chloride: 100 mmol/L (ref 96–106)
Creatinine, Ser: 0.79 mg/dL (ref 0.76–1.27)
Globulin, Total: 2.2 g/dL (ref 1.5–4.5)
Glucose: 108 mg/dL — ABNORMAL HIGH (ref 70–99)
Potassium: 3.8 mmol/L (ref 3.5–5.2)
Sodium: 141 mmol/L (ref 134–144)
Total Protein: 6.6 g/dL (ref 6.0–8.5)
eGFR: 93 mL/min/{1.73_m2} (ref >59)

## 2021-09-26 LAB — C4 COMPLEMENT: Complement C4, Serum: 21 mg/dL (ref 12–38)

## 2021-09-26 LAB — ALPHA-GAL PANEL
Allergen Lamb IgE: <0.1 kU/L
Beef IgE: <0.1 kU/L
IgE (Immunoglobulin E), Serum: 188 IU/mL (ref 6–495)
O215-IgE Alpha-Gal: <0.1 kU/L
Pork IgE: <0.1 kU/L

## 2021-09-26 LAB — THYROID PEROXIDASE ANTIBODY: Thyroperoxidase Ab SerPl-aCnc: 10 IU/mL (ref 0–34)

## 2021-09-26 NOTE — Patient Instructions (Signed)
Continue clopidogrel 75 mg daily for now but if hives and lip swelling return, we may need to consider stopped Plavix to see if symptoms resolve. If this does need to happen, would recommend you start taking aspirin 325mg  daily in place of Plavix.  Please ensure you continue to follow with your allergy specialist for ongoing work-up  Continue atorvastatin 40mg  daily  for secondary stroke prevention  Continue to follow up with PCP regarding cholesterol and blood pressure management  Maintain strict control of hypertension with blood pressure goal below 130/90 and cholesterol with LDL cholesterol (bad cholesterol) goal below 70 mg/dL.   Importance for complete tobacco cessation as continued use clearly increases risk of additional strokes - you got this!!       Followup in the future with me in 6 months or call earlier if needed       Thank you for coming to see [place] at [facility]. I hope we have been able to provide you high quality care today.  You may receive a patient satisfaction survey over the next few weeks. We would appreciate your feedback and comments so that we may continue to improve ourselves and the health of our patients.

## 2021-09-26 NOTE — Progress Notes (Signed)
[facility] [address] [place] [address] [phone]       Edward Thornton  [name] Date of Birth:  [birth year] Medical Record Number:  [id number]   Reason for Referral:  hospital stroke follow up    SUBJECTIVE:   CHIEF COMPLAINT:  Chief Complaint  Patient presents with   New Patient (Initial Visit)    RM 2 with wife Edward Thornton. Here for hospital f/u reports he has been doing well. Wanted to discuss how long he should remain on plavix.     HPI:   Edward Thornton is a 74 y.o. 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  MR brain unremarkable.  EF 65%.  LDL 87.  A1c 5.9.  Recommended DAPT for 3 weeks then Plavix alone as well as increase home dose atorvastatin to 40 mg daily.  PT/OT no therapy needs   Today, 09/26/2021, patient being seen for hospital follow up accompanied by his wife.  Overall stable from stroke standpoint without new or reoccurring stroke/TIA symptoms.  Has been having issues with lip swelling - was seen in ED and concern of allergic reaction possibly to losartan. This was discontinued but continued to have lip swelling as well as issues with hives. He has been seen by allergy specialist last week who did lab work currently still pending and was placed on Zyrtec and Pepcid - he continued for 4 days but had to stop taking due to elevated BP with dizziness and drowsiness.  He has been off Zyrtec and Pepcid since 10/14 and has not experienced any additional falls or lip swelling - did have light upper lip sensation yesterday but did not progress to swelling. There was some concern of  possible allergic reaction to Plavix.   He otherwise is tolerating Plavix without side effects of bleeding or bruising.  Tolerating atorvastatin 40 mg daily without side effects.  Blood pressure today 132/64.  Routinely monitors at home and typically 130-140s/70s.  Continued tobacco use but has decreased daily amount with goals of complete cessation.  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   Hyperlipidemia    Hypertension     PSH:  Past Surgical History:  Procedure Laterality Date   adominal hernia  1990   HERNIA REPAIR Left 12/10/1997   KNEE ARTHROSCOPY  1990   right   KNEE ARTHROSCOPY  2010   left   PILONIDAL CYST EXCISION  1980    Social History:  Social History   Socioeconomic History   Marital status: Married    Spouse name: Not on file   Number of children: 2   Years of education: Not on file   Highest education level: Not on file  Occupational History   Occupation: Insurance account manager, part-time/ retired  Tobacco Use   Smoking status: Every Day    Packs/day: 1.00    Years: 50.00    Pack years: 50.00    Types:  Cigarettes    Start date: 12/10/1964   Smokeless tobacco: Never  Vaping Use   Vaping Use: Never used  Substance and Sexual Activity   Alcohol use: Yes    Alcohol/week: 3.0 standard drinks    Types: 3 Cans of beer per week    Comment: drinks beer; 3 or 4 a week    Drug use: No   Sexual activity: Yes  Other Topics Concern   Not on file  Social History Narrative   Not on file   Social Determinants of Health   Financial Resource Strain: Low Risk    Difficulty of Paying Living Expenses: Not hard at all  Food Insecurity: No Food Insecurity   Worried About Charity fundraiser in the Last Year: Never true   [place] in the Last Year: Never true   Transportation Needs: No Transportation Needs   Lack of Transportation (Medical): No   Lack of Transportation (Non-Medical): No  Physical Activity: Sufficiently Active   Days of Exercise per Week: 5 days   Minutes of Exercise per Session: 30 min  Stress: No Stress Concern Present   Feeling of Stress : Not at all  Social Connections: Socially Integrated   Frequency of Communication with Friends and Family: More than three times a week   Frequency of Social Gatherings with Friends and Family: More than three times a week   Attends Religious Services: More than 4 times per year   Active Member of [employer] or Organizations: Yes   Attends Music therapist: More than 4 times per year   Marital Status: Married  Human resources officer Violence: Not on file    Family History:  Family History  Problem Relation Age of Onset   Stroke Mother    Heart disease Mother    Stroke Father    Heart disease Father    Colon cancer Neg Hx    Stomach cancer Neg Hx     Medications:   Current Outpatient Medications on File Prior to Visit  Medication Sig Dispense Refill   acetaminophen (TYLENOL) 500 MG tablet Take 1,000 mg by mouth every 6 (six) hours as needed for mild pain.     amLODipine (NORVASC) 10 MG tablet TAKE 1 TABLET BY MOUTH  DAILY (Patient taking differently: Take 10 mg by mouth daily.) 90 tablet 3   atorvastatin (LIPITOR) 40 MG tablet Take 1 tablet (40 mg total) by mouth daily. 30 tablet 3   cholecalciferol (VITAMIN D) 1000 units tablet Take 1 tablet (1,000 Units total) by mouth 2 (two) times daily. Takes 2 tablets twice daily (Patient taking differently: Take 2,000 Units by mouth 2 (two) times daily.) 180 tablet 2   clopidogrel (PLAVIX) 75 MG tablet Take 1 tablet (75 mg total) by mouth daily. 30 tablet 3   dimenhyDRINATE (DRAMAMINE) 50 MG tablet Take 50 mg by mouth every 8 (eight) hours as needed for dizziness.     famotidine (PEPCID) 20 MG tablet Take 1 tablet (20 mg total) by mouth 2  (two) times daily. 60 tablet 5   hydrALAZINE (APRESOLINE) 10 MG tablet Take 1 tablet (10 mg total) by mouth in the morning and at bedtime. 60 tablet 1   hydrochlorothiazide (HYDRODIURIL) 25 MG tablet TAKE 1 TABLET BY MOUTH  DAILY (Patient taking differently: Take 25 mg by mouth daily.) 90 tablet 3   Menthol-Methyl Salicylate (SALONPAS PAIN RELIEF PATCH EX) Apply 1 patch topically daily as needed (knee pain).     naproxen sodium (  ALEVE) 220 MG tablet Take 220 mg by mouth daily as needed (pain).     Omega-3 Fatty Acids (FISH OIL) 1200 MG CAPS Take 1 capsule (1,200 mg total) by mouth daily. Take 1 capsule daily (Patient taking differently: Take 1,000 mg by mouth daily.) 90 capsule 3   potassium chloride (KLOR-CON) 20 MEQ tablet Take 1 tablet (20 mEq total) by mouth daily. 30 tablet 1   Turmeric 500 MG TABS Take 500 mg by mouth. Take twice per day     No current facility-administered medications on file prior to visit.    Allergies:   Allergies  Allergen Reactions   Losartan Swelling      OBJECTIVE:  Physical Exam  Vitals:   09/26/21 1315  BP: 132/64  Pulse: 68  SpO2: 94%  Weight: 185 lb (83.9 kg)  Height: 6' (1.829 m)   Body mass index is 25.09 kg/m. No results found.  Post stroke PHQ 2/9 Depression screen PHQ 2/9 09/26/2021  Decreased Interest 0  Down, Depressed, Hopeless 0  PHQ - 2 Score 0     General: well developed, well nourished, very pleasant elderly Caucasian male, seated, in no evident distress Head: head normocephalic and atraumatic.   Neck: supple with no carotid or supraclavicular bruits Cardiovascular: regular rate and rhythm, no murmurs Musculoskeletal: no deformity Skin:  no rash/petichiae Vascular:  Normal pulses all extremities   Neurologic Exam Mental Status: Awake and fully alert.  Fluent speech and language.  Oriented to place and time. Recent and remote memory intact. Attention span, concentration and fund of knowledge appropriate. Mood and affect  appropriate.  Cranial Nerves: Fundoscopic exam reveals sharp disc margins. Pupils equal, briskly reactive to light. Extraocular movements full without nystagmus. Visual fields full to confrontation. Hearing intact. Facial sensation intact. Face, tongue, palate moves normally and symmetrically.  Motor: Normal bulk and tone. Normal strength in all tested extremity muscles Sensory.: intact to touch , pinprick , position and vibratory sensation.  Coordination: Rapid alternating movements normal in all extremities. Finger-to-nose and heel-to-shin performed accurately bilaterally. Gait and Station: Arises from chair without difficulty. Stance is normal. Gait demonstrates normal stride length and balance without use of assistive device. Tandem walk and heel toe without difficulty.  Reflexes: 1+ and symmetric. Toes downgoing.     NIHSS  0 Modified Rankin  0      ASSESSMENT: Edward Thornton is a 75 y.o. year old male right brain TIA likely secondary to small vessel disease s/p TNK on 08/20/2021.  Vascular risk factors include HTN, HLD and tobacco use.      PLAN:  TIA s/p TNK : Continue clopidogrel 75 mg daily  and atorvastatin for secondary stroke prevention.  Discussed secondary stroke prevention measures and importance of close PCP follow up for aggressive stroke risk factor management. I have gone over the pathophysiology of stroke, warning signs and symptoms, risk factors and their management in some detail with instructions to go to the closest emergency room for symptoms of concern. Allergic reaction: Currently being followed by allergy specialist Dr. Neldon Mc for angioedema of lips and body rash/hives.  Recent lab work completed still resulting. No recurrence since use of Zyrtec and Pepcid (only able to tolerate for 4 day period) - ended on 10/14. Mention concern of possible reaction from Plavix - discussed possibility of discontinuing Plavix to see if symptoms resolve (if they should recur) with  risk vs benefit discussion - if plavix held, would recommend restart of aspirin full dose 325 mg daily  as recent TIA occurred while on aspirin 81mg  daily - he plans on further following up with allergy specialist HTN: BP goal <130/90.  Stable on current regimen per PCP HLD: LDL goal <70. Recent LDL 87.  Continue atorvastatin 40 mg daily per PCP Tobacco use: Discussed importance of complete tobacco cessation as continued use greatly increases additional strokes.  Currently in the process of decreasing daily amount with goal of complete cessation    Follow up in 6 months or call earlier if needed   CC:  [facility] provider: Dr. Leonie Man PCP: Hoyt Koch, MD   Allena Katz, MD -allergy specialist  I spent 59 minutes of face-to-face and non-face-to-face time with patient and wife.  This included previsit chart review including review of recent hospitalization, lab review, study review, electronic health record documentation, patient education regarding recent TIA including etiology, secondary stroke prevention measures and importance of managing stroke risk factors, allergic reaction concerns and further prolonged discussion and answered all other questions to patient and wife's satisfaction   Frann Rider, AGNP-BC  [facility] [address] [place], [place] [address]  Phone [phone] Fax [phone] Note: This document was prepared with digital dictation and possible smart phrase technology. Any transcriptional errors that result from this process are unintentional.

## 2021-09-27 DIAGNOSIS — L509 Urticaria, unspecified: Secondary | ICD-10-CM | POA: Insufficient documentation

## 2021-09-27 NOTE — Assessment & Plan Note (Signed)
Counseled extensively on need to quit and strategies. He is cutting back currently. Reminded about high risk for recurrence of stroke or heart attack as well as lung risk.

## 2021-09-27 NOTE — Assessment & Plan Note (Signed)
Undergoing evaluation through allergy and resulted labs reviewed with patient and normal. Pending still is alpha gal panel and discussed what that was with patient and wife. He is currently using benadryl prn. It is unclear to me if this may be drug reaction to losartan and he was advised to stay off this and it was added to his allergy list given his angioedema. He did not have anaphylaxis and has not had serious recurrence of angioedema since being off losartan.

## 2021-09-27 NOTE — Assessment & Plan Note (Signed)
Overall he is improving well from his stroke. Counseled extensively during visit about risk for recurrence and need to stop smoking. BP at goal today. No diabetes. He is on statin lipitor 40 mg daily and taking concurrent aspirin and plavix and then plans to continue on plavix alone (taking aspirin prior to stroke). Seeing neurology for follow up soon.

## 2021-09-28 ENCOUNTER — Ambulatory Visit: Payer: Medicare Other | Admitting: Allergy & Immunology

## 2021-09-28 NOTE — Progress Notes (Signed)
I agree with the above plan 

## 2021-10-10 ENCOUNTER — Inpatient Hospital Stay: Payer: Medicare Other | Admitting: Adult Health

## 2021-10-11 ENCOUNTER — Other Ambulatory Visit: Payer: Self-pay | Admitting: Internal Medicine

## 2021-11-08 ENCOUNTER — Other Ambulatory Visit: Payer: Self-pay | Admitting: *Deleted

## 2021-11-08 DIAGNOSIS — Z87891 Personal history of nicotine dependence: Secondary | ICD-10-CM

## 2021-11-08 DIAGNOSIS — F17210 Nicotine dependence, cigarettes, uncomplicated: Secondary | ICD-10-CM

## 2021-11-09 ENCOUNTER — Other Ambulatory Visit: Payer: Self-pay

## 2021-11-09 ENCOUNTER — Encounter: Payer: Self-pay | Admitting: Internal Medicine

## 2021-11-09 ENCOUNTER — Ambulatory Visit (INDEPENDENT_AMBULATORY_CARE_PROVIDER_SITE_OTHER): Payer: Medicare Other | Admitting: Internal Medicine

## 2021-11-09 VITALS — BP 130/60 | HR 82 | Resp 18 | Ht 72.0 in | Wt 182.8 lb

## 2021-11-09 DIAGNOSIS — E782 Mixed hyperlipidemia: Secondary | ICD-10-CM | POA: Diagnosis not present

## 2021-11-09 DIAGNOSIS — L509 Urticaria, unspecified: Secondary | ICD-10-CM

## 2021-11-09 DIAGNOSIS — Z0000 Encounter for general adult medical examination without abnormal findings: Secondary | ICD-10-CM | POA: Diagnosis not present

## 2021-11-09 DIAGNOSIS — Z8673 Personal history of transient ischemic attack (TIA), and cerebral infarction without residual deficits: Secondary | ICD-10-CM | POA: Diagnosis not present

## 2021-11-09 DIAGNOSIS — Z720 Tobacco use: Secondary | ICD-10-CM

## 2021-11-09 DIAGNOSIS — I10 Essential (primary) hypertension: Secondary | ICD-10-CM

## 2021-11-09 LAB — LIPID PANEL
Cholesterol: 123 mg/dL (ref 0–200)
HDL: 42.9 mg/dL (ref >39.00)
LDL Cholesterol: 56 mg/dL (ref 0–99)
NonHDL: 80.03
Total CHOL/HDL Ratio: 3
Triglycerides: 119 mg/dL (ref 0.0–149.0)
VLDL: 23.8 mg/dL (ref 0.0–40.0)

## 2021-11-09 LAB — CBC
HCT: 45.6 % (ref 39.0–52.0)
Hemoglobin: 15.2 g/dL (ref 13.0–17.0)
MCHC: 33.3 g/dL (ref 30.0–36.0)
MCV: 94.4 fl (ref 78.0–100.0)
Platelets: 268 10*3/uL (ref 150.0–400.0)
RBC: 4.83 Mil/uL (ref 4.22–5.81)
RDW: 13.2 % (ref 11.5–15.5)
WBC: 7.7 10*3/uL (ref 4.0–10.5)

## 2021-11-09 LAB — COMPREHENSIVE METABOLIC PANEL
ALT: 13 U/L (ref 0–53)
AST: 14 U/L (ref 0–37)
Albumin: 3.9 g/dL (ref 3.5–5.2)
Alkaline Phosphatase: 67 U/L (ref 39–117)
BUN: 14 mg/dL (ref 6–23)
CO2: 31 mEq/L (ref 19–32)
Calcium: 9.7 mg/dL (ref 8.4–10.5)
Chloride: 102 mEq/L (ref 96–112)
Creatinine, Ser: 0.88 mg/dL (ref 0.40–1.50)
GFR: 84.59 mL/min (ref >60.00)
Glucose, Bld: 123 mg/dL — ABNORMAL HIGH (ref 70–99)
Potassium: 3.7 mEq/L (ref 3.5–5.1)
Sodium: 138 mEq/L (ref 135–145)
Total Bilirubin: 0.8 mg/dL (ref 0.2–1.2)
Total Protein: 6.8 g/dL (ref 6.0–8.3)

## 2021-11-09 MED ORDER — CLOPIDOGREL BISULFATE 75 MG PO TABS: 75.0000 mg | ORAL_TABLET | Freq: Every day | ORAL | 3 refills | Status: DC

## 2021-11-09 MED ORDER — HYDRALAZINE HCL 10 MG PO TABS: 10.0000 mg | ORAL_TABLET | Freq: Two times a day (BID) | ORAL | 3 refills | Status: DC

## 2021-11-09 MED ORDER — ATORVASTATIN CALCIUM 40 MG PO TABS: 40.0000 mg | ORAL_TABLET | Freq: Every day | ORAL | 3 refills | Status: DC

## 2021-11-09 MED ORDER — TRIAMTERENE-HCTZ 37.5-25 MG PO TABS: 1.0000 | ORAL_TABLET | Freq: Every day | ORAL | 3 refills | Status: DC

## 2021-11-09 NOTE — Patient Instructions (Addendum)
We will have you stop hydrochlorothiazide and start triamterene/hydrochlorothiazide which is similar but will keep your own potassium. Stop the potassium pills.   Go ahead and get the covid-19 booster shot.

## 2021-11-09 NOTE — Progress Notes (Signed)
   Subjective:   Patient ID: Edward Thornton, male    DOB: February 12, 1946, 75 y.o.   MRN: [id number]  HPI The patient is a 75 YO man coming in for physical, working on smoking less.  PMH, [facility], social history reviewed and updated  Review of Systems  Constitutional: Negative.   HENT: Negative.    Eyes: Negative.   Respiratory:  Negative for cough, chest tightness and shortness of breath.   Cardiovascular:  Negative for chest pain, palpitations and leg swelling.  Gastrointestinal:  Negative for abdominal distention, abdominal pain, constipation, diarrhea, nausea and vomiting.  Musculoskeletal: Negative.   Skin: Negative.   Neurological: Negative.   Psychiatric/Behavioral: Negative.     Objective:  Physical Exam Constitutional:      Appearance: He is well-developed.  HENT:     Head: Normocephalic and atraumatic.  Cardiovascular:     Rate and Rhythm: Normal rate and regular rhythm.  Pulmonary:     Effort: Pulmonary effort is normal. No respiratory distress.     Breath sounds: Normal breath sounds. No wheezing or rales.  Abdominal:     General: Bowel sounds are normal. There is no distension.     Palpations: Abdomen is soft.     Tenderness: There is no abdominal tenderness. There is no rebound.  Musculoskeletal:     Cervical back: Normal range of motion.  Skin:    General: Skin is warm and dry.  Neurological:     Mental Status: He is alert and oriented to person, place, and time.     Coordination: Coordination normal.    Vitals:   11/09/21 1000  BP: 130/60  Pulse: 82  Resp: 18  SpO2: 96%  Weight: 182 lb 12.8 oz (82.9 kg)  Height: 6' (1.829 m)    This visit occurred during the SARS-CoV-2 public health emergency.  Safety protocols were in place, including screening questions prior to the visit, additional usage of staff PPE, and extensive cleaning of exam room while observing appropriate contact time as indicated for disinfecting solutions.   Assessment & Plan:

## 2021-11-10 ENCOUNTER — Encounter: Payer: Self-pay | Admitting: Internal Medicine

## 2021-11-10 NOTE — Assessment & Plan Note (Signed)
Checking lipid panel and adjust as needed. We will plan to keep atorvastatin 40 mg daily dosing after recent stroke.

## 2021-11-10 NOTE — Assessment & Plan Note (Signed)
Resolved off medications and may have been related to losartan will maintain off ARB.

## 2021-11-10 NOTE — Assessment & Plan Note (Signed)
BP at goal. We will simplify regimen to triamterene/hctz 37.5/25 mg daily and d/c hctz 25 mg daily and potassium 20 meQ daily. We will keep hydralazine 10 mg BID. Checking CMP and adjust as needed.

## 2021-11-10 NOTE — Assessment & Plan Note (Signed)
Flu shot up to date. Covid-19 booster encouraged. Pneumonia complete. Shingrix complete. Tetanus due 2028. Colonoscopy due 2023. Counseled about sun safety and mole surveillance. Counseled about the dangers of distracted driving. Given 10 year screening recommendations.

## 2021-11-10 NOTE — Assessment & Plan Note (Signed)
BP at goal. Taking plavix and atorvastatin 40 mg daily. Continue yearly monitoring for diabetes and BP monitoring at every visit. BP at goal today. Advised to stop smoking and he is working on it.

## 2021-11-10 NOTE — Assessment & Plan Note (Signed)
Advised to quit and he is trying to cut back.

## 2021-11-13 ENCOUNTER — Telehealth: Payer: Self-pay | Admitting: Internal Medicine

## 2021-11-13 NOTE — Telephone Encounter (Signed)
Patient states he received a text message from  [facility] stating there was an issue filling rx triamterene-hydrochlorothiazide (MAXZIDE-25) 37.5-25 MG tablet  and for the provider to call [facility]   Patient requesting a call back

## 2021-11-14 NOTE — Telephone Encounter (Signed)
Spoke with the patient  and I verified the prescription went through on our end. Patient stated that he is also going to call the pharmacy.

## 2021-11-15 ENCOUNTER — Other Ambulatory Visit: Payer: Self-pay

## 2021-11-15 ENCOUNTER — Ambulatory Visit (INDEPENDENT_AMBULATORY_CARE_PROVIDER_SITE_OTHER)
Admission: RE | Admit: 2021-11-15 | Discharge: 2021-11-15 | Disposition: A | Discharge status: Home / Self Care | Payer: Medicare Other | Source: Ambulatory Visit | Attending: Acute Care | Admitting: Acute Care

## 2021-11-15 DIAGNOSIS — F17210 Nicotine dependence, cigarettes, uncomplicated: Secondary | ICD-10-CM

## 2021-11-15 DIAGNOSIS — Z87891 Personal history of nicotine dependence: Secondary | ICD-10-CM

## 2021-11-16 ENCOUNTER — Encounter: Payer: Self-pay | Admitting: *Deleted

## 2021-11-16 NOTE — Progress Notes (Unsigned)
[facility] - 11/16/21 1500       Assessment    Assessment type Phone to patient    Is patient still in hospital? No    Date of hospital discharge after thrombolysis? 08/22/21      Final 90-Day Modified Rankin Score   Final 90-Day Modified Rankin Score: ([facility]) 0-No symptoms from stroke remain, but able to carry out all usual activities      [id number]   Mobility 1- no problems in walking about    Self-care 1- no problems with Self-care    Usual activities 1- no problems with performing usual activities (e.g. work, study, housework, family or leisure activities)    Pain/discomfort 1- no pain or discomfort    Anxiety/Depression 1- not anxious or depressed    What number between 0-100 best describes the patient's health state today (100 means the best health; 0 means the worst health)? [facility] Admission   In the Past 3 months (since your initial hospitalisation for stroke), have you been admitted to hospital (including day-only procedures) for any reason? No      Doctor consultations   In the past 3 months (since your initial hospitalisation for stroke), have you seen any doctors or other health professional (for example physiotherapy, outpatient nurse, general practitioner) for any reason? Yes      a. Doctor consultations   a. Type of service IM    a. Condition or purpose routine exam    a. Date of appointment 11/09/21      b. Doctor consultations   b. Type of service IM    b. Condition or purpose Angioedema    b. Date of appointment 09/08/21      c. Doctor consultations   c. Type of service IM    c. Condition or purpose HTN    c. Date of appointment 09/15/21      d. Doctor consultation   d. Type of service Allergy    d. Condition or purpose Adverse effect of medication    d. Date of appointment 09/19/21      e. Doctor consultation   e. Type of service IM    e. Condition or purpose Hx of stroke    e. Date of appointment 09/25/21      f. Doctor  consultation   f. Type of service Neurology    f. Condition or purpose TIA    f. Date of appointment 09/26/21

## 2021-11-17 ENCOUNTER — Other Ambulatory Visit: Payer: Self-pay | Admitting: Acute Care

## 2021-11-17 DIAGNOSIS — F17200 Nicotine dependence, unspecified, uncomplicated: Secondary | ICD-10-CM

## 2021-11-17 DIAGNOSIS — Z87891 Personal history of nicotine dependence: Secondary | ICD-10-CM

## 2021-11-27 ENCOUNTER — Other Ambulatory Visit: Payer: Self-pay

## 2021-11-27 NOTE — Patient Outreach (Signed)
[facility]) Care Management  11/27/2021  [place] 1946-10-05 [id number]   No telephone outreach to patient to obtain mRS was successfully completed By Hollice Espy on 11/16/21. MRS= 0   [place] Care Management Assistant

## 2021-11-30 ENCOUNTER — Telehealth: Payer: Self-pay | Admitting: Internal Medicine

## 2021-11-30 ENCOUNTER — Other Ambulatory Visit: Payer: Self-pay | Admitting: Internal Medicine

## 2021-11-30 DIAGNOSIS — I10 Essential (primary) hypertension: Secondary | ICD-10-CM

## 2021-11-30 MED ORDER — HYDRALAZINE HCL 25 MG PO TABS: 25.0000 mg | ORAL_TABLET | Freq: Four times a day (QID) | ORAL | 0 refills | Status: DC

## 2021-11-30 NOTE — Telephone Encounter (Signed)
Patient states new medication hydrALAZINE (APRESOLINE) 10 MG tablet triamterene-hydrochlorothiazide (MAXZIDE-25) 37.5-25 MG tablet is causing his bp to elevate  Patient states his average bp is 160/90  Patient denies any other symptoms and states he feels fine  Patient has an ov on 12-2

## 2021-11-30 NOTE — Telephone Encounter (Signed)
Spoke with patient today. 

## 2021-12-01 NOTE — Telephone Encounter (Signed)
Patient calling in  Wants to speak to someone about his dosage increase from 20mg  daily to 100mg .. says he wants to know why it was increased so much  Please call [phone]

## 2021-12-05 ENCOUNTER — Ambulatory Visit: Payer: Medicare Other | Admitting: Allergy and Immunology

## 2021-12-05 ENCOUNTER — Other Ambulatory Visit: Payer: Self-pay | Admitting: Internal Medicine

## 2021-12-05 DIAGNOSIS — I10 Essential (primary) hypertension: Secondary | ICD-10-CM

## 2021-12-05 NOTE — Telephone Encounter (Signed)
Spoke with patient today. 

## 2021-12-07 ENCOUNTER — Ambulatory Visit: Payer: Medicare Other | Admitting: Internal Medicine

## 2021-12-07 ENCOUNTER — Other Ambulatory Visit: Payer: Self-pay

## 2021-12-07 ENCOUNTER — Encounter: Payer: Self-pay | Admitting: Internal Medicine

## 2021-12-07 ENCOUNTER — Ambulatory Visit (INDEPENDENT_AMBULATORY_CARE_PROVIDER_SITE_OTHER): Payer: Medicare Other | Admitting: Internal Medicine

## 2021-12-07 VITALS — BP 132/62 | HR 74 | Ht 72.0 in | Wt 177.8 lb

## 2021-12-07 DIAGNOSIS — L509 Urticaria, unspecified: Secondary | ICD-10-CM

## 2021-12-07 DIAGNOSIS — I10 Essential (primary) hypertension: Secondary | ICD-10-CM | POA: Diagnosis not present

## 2021-12-07 DIAGNOSIS — Z720 Tobacco use: Secondary | ICD-10-CM

## 2021-12-07 DIAGNOSIS — Z8673 Personal history of transient ischemic attack (TIA), and cerebral infarction without residual deficits: Secondary | ICD-10-CM

## 2021-12-07 MED ORDER — METOPROLOL SUCCINATE ER 50 MG PO TB24: 50.0000 mg | ORAL_TABLET | Freq: Every day | ORAL | 3 refills | Status: DC

## 2021-12-07 NOTE — Progress Notes (Signed)
Patient ID: Edward Thornton, male   DOB: 1946/06/13, 76 y.o.   MRN: [id number]        Chief Complaint: follow up htn, smoking, hives and hx of stroke       HPI:  Edward Thornton is a 75 y.o. male here with c/o recent hives on losartan, changed to hydralazine 10 bid then 25 qid, then most recenly increased to 50 qid but has not started this yet, just not sure if this is the best to do, and not wanting to take a medication every 6 hrs for the rest of his life.  Pt denies chest pain, increased sob or doe, wheezing, orthopnea, PND, increased LE swelling, palpitations, dizziness or syncope.   Pt denies polydipsia, polyuria, or new focal neuro s/s.   Pt denies fever, wt loss, night sweats, loss of appetite, or other constitutional symptoms  No further hives after losartan stsopped.  Still smoking not ready to quit.            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 He started smoking about 57 years ago. He has a 50.00 pack-year smoking history. He has never used smokeless tobacco. He reports current alcohol use of about 3.0 standard drinks per week. He reports that he does not use drugs. family history includes Heart disease in his father and mother; Stroke in his father and mother. Allergies  Allergen Reactions   Losartan Swelling   Current Outpatient Medications on File Prior to Visit  Medication Sig Dispense Refill    acetaminophen (TYLENOL) 500 MG tablet Take 1,000 mg by mouth every 6 (six) hours as needed for mild pain.     amLODipine (NORVASC) 10 MG tablet TAKE 1 TABLET BY MOUTH  DAILY 90 tablet 3   atorvastatin (LIPITOR) 40 MG tablet Take 1 tablet (40 mg total) by mouth daily. 90 tablet 3   cholecalciferol (VITAMIN D) 1000 units tablet Take 1 tablet (1,000 Units total) by mouth 2 (two) times daily. Takes 2 tablets twice daily (Patient taking differently: Take 2,000 Units by mouth 2 (two) times daily.) 180 tablet 2   clopidogrel (PLAVIX) 75 MG tablet Take 1 tablet (75 mg total) by mouth daily. 90 tablet 3   Menthol-Methyl Salicylate (SALONPAS PAIN RELIEF PATCH EX) Apply 1 patch topically daily as needed (knee pain).     Omega-3 Fatty Acids (FISH OIL) 1200 MG CAPS Take 1 capsule (1,200 mg total) by mouth daily. Take 1 capsule daily (Patient taking differently: Take 1,000 mg by mouth daily.) 90 capsule 3   triamterene-hydrochlorothiazide (MAXZIDE-25) 37.5-25 MG tablet Take 1 tablet by mouth daily. 90 tablet 3   Turmeric 500 MG TABS Take 500 mg by mouth. Take twice per day     No  current facility-administered medications on file prior to visit.        ROS:  All others reviewed and negative.  Objective        PE:  BP 132/62 (BP Location: Left Arm, Patient Position: Sitting, Cuff Size: Large)    Pulse 74    Ht 6' (1.829 m)    Wt 177 lb 12.8 oz (80.6 kg)    SpO2 96%    BMI 24.11 kg/m                 Constitutional: Pt appears in NAD               HENT: Head: NCAT.                Right Ear: External ear normal.                 Left Ear: External ear normal.                Eyes: . Pupils are equal, round, and reactive to light. Conjunctivae and EOM are normal               Nose: without d/c or deformity               Neck: Neck supple. Gross normal ROM               Cardiovascular: Normal rate and regular rhythm.                 Pulmonary/Chest: Effort normal and breath sounds without rales or wheezing.                 Abd:  Soft, NT, ND, + BS, no organomegaly               Neurological: Pt is alert. At baseline orientation, motor grossly intact               Skin: Skin is warm. No rashes, no other new lesions, LE edema - none               Psychiatric: Pt behavior is normal without agitation   Micro: none  Cardiac tracings I have personally interpreted today:  none  Pertinent Radiological findings (summarize): none   Lab Results  Component Value Date   WBC 7.7 11/09/2021   HGB 15.2 11/09/2021   HCT 45.6 11/09/2021   PLT 268.0 11/09/2021   GLUCOSE 123 (H) 11/09/2021   CHOL 123 11/09/2021   TRIG 119.0 11/09/2021   HDL 42.90 11/09/2021   LDLDIRECT 85.0 09/16/2017   LDLCALC 56 11/09/2021   ALT 13 11/09/2021   AST 14 11/09/2021   NA 138 11/09/2021   K 3.7 11/09/2021   CL 102 11/09/2021   CREATININE 0.88 11/09/2021   BUN 14 11/09/2021   CO2 31 11/09/2021   TSH 1.590 09/19/2021   INR 1.0 08/20/2021   HGBA1C 5.9 (H) 08/21/2021   MICROALBUR <0.7 11/08/2020   Assessment/Plan:  Edward Thornton is a 75 y.o. White or Caucasian [1] male with  has a past medical history of Arthritis, Cancer ([place]) (1995), Hyperlipidemia, and Hypertension.  Essential hypertension BP Readings from Last 3 Encounters:  12/07/21 132/62  11/09/21 130/60  09/26/21 132/64   Controlled but pt unhappy with current regimen, pt to continue medical treatment norvasc and maxide but change hydralazine to toprol xl 50 qd   Tobacco abuse Pt counsled to quit, pt not ready  Hx of completed stroke  Stable, continue current med tx - plavix, lipitor  Hives Resolved, etiology unclear, but d/w pt that going back to ARB's is likely not a good idea  Followup: Return in about 4 weeks (around 01/04/2022) for follow up Dr Sharlet Salina 2-4 weeks.  Cathlean Cower, MD 12/09/2021 5:06 PM [facility]

## 2021-12-07 NOTE — Patient Instructions (Signed)
Ok to simply STOP the hydralazine as like you, I would not want to take a pill every 6 hours the rest of my life either  Please take all new medication as prescribed - the Toprol XL 50 mg - 1 per day to start, though I suspect you may need 75 mg or 100 mg to get your average BP to less than 130/80 (or at least less than 140/90)  Please continue to monitor your BP at home (starting in about 3-5 days), but realizing the TOP number may be about 5 points higher than what we might get here based on the results we saw today when comparing your machine with out BP here   Please continue all other medications as before, including the norvasc and fluid pill  Please have the pharmacy call with any other refills you may need.  Please continue your efforts at being more active, low cholesterol diet, and weight control.  Please keep your appointments with your specialists as you may have planned  Please make an appointment to see Dr Sharlet Salina in 2-4 weeks to follow up your Blood Pressures

## 2021-12-09 ENCOUNTER — Encounter: Payer: Self-pay | Admitting: Internal Medicine

## 2021-12-09 NOTE — Assessment & Plan Note (Signed)
Stable, continue current med tx - plavix, lipitor

## 2021-12-09 NOTE — Assessment & Plan Note (Signed)
Pt counsled to quit, pt not ready °

## 2021-12-09 NOTE — Assessment & Plan Note (Signed)
Resolved, etiology unclear, but d/w pt that going back to ARB's is likely not a good idea

## 2021-12-09 NOTE — Assessment & Plan Note (Addendum)
BP Readings from Last 3 Encounters:  12/07/21 132/62  11/09/21 130/60  09/26/21 132/64   Controlled but pt unhappy with current regimen, pt to continue medical treatment norvasc and maxide but change hydralazine to toprol xl 50 qd

## 2021-12-10 ENCOUNTER — Encounter: Payer: Self-pay | Admitting: Internal Medicine

## 2021-12-18 ENCOUNTER — Ambulatory Visit: Payer: Medicare Other

## 2021-12-18 ENCOUNTER — Other Ambulatory Visit: Payer: Self-pay

## 2021-12-29 ENCOUNTER — Ambulatory Visit (INDEPENDENT_AMBULATORY_CARE_PROVIDER_SITE_OTHER): Payer: Medicare Other

## 2021-12-29 ENCOUNTER — Other Ambulatory Visit: Payer: Self-pay

## 2021-12-29 DIAGNOSIS — Z0000 Encounter for general adult medical examination without abnormal findings: Secondary | ICD-10-CM

## 2021-12-29 NOTE — Patient Instructions (Signed)
Edward Thornton , Thank you for taking time to come for your Medicare Wellness Visit. I appreciate your ongoing commitment to your health goals. Please review the following plan we discussed and let me know if I can assist you in the future.   Screening recommendations/referrals: Colonoscopy: 07/25/2012  due 2023 Recommended yearly ophthalmology/optometry visit for glaucoma screening and checkup Recommended yearly dental visit for hygiene and checkup  Vaccinations: Influenza vaccine: completed  Pneumococcal vaccine: completed  Tdap vaccine: 06/09/2017 Shingles vaccine: completed     Advanced directives: yes   Conditions/risks identified: none   Next appointment: none   Preventive Care 27 Years and Older, Male Preventive care refers to lifestyle choices and visits with your health care provider that can promote health and wellness. What does preventive care include? A yearly physical exam. This is also called an annual well check. Dental exams once or twice a year. Routine eye exams. Ask your health care provider how often you should have your eyes checked. Personal lifestyle choices, including: Daily care of your teeth and gums. Regular physical activity. Eating a healthy diet. Avoiding tobacco and drug use. Limiting alcohol use. Practicing safe sex. Taking low doses of aspirin every day. Taking vitamin and mineral supplements as recommended by your health care provider. What happens during an annual well check? The services and screenings done by your health care provider during your annual well check will depend on your age, overall health, lifestyle risk factors, and family history of disease. Counseling  Your health care provider may ask you questions about your: Alcohol use. Tobacco use. Drug use. Emotional well-being. Home and relationship well-being. Sexual activity. Eating habits. History of falls. Memory and ability to understand (cognition). Work and work  Statistician. Screening  You may have the following tests or measurements: Height, weight, and BMI. Blood pressure. Lipid and cholesterol levels. These may be checked every 5 years, or more frequently if you are over 42 years old. Skin check. Lung cancer screening. You may have this screening every year starting at age 41 if you have a 30-pack-year history of smoking and currently smoke or have quit within the past 15 years. Fecal occult blood test (FOBT) of the stool. You may have this test every year starting at age 20. Flexible sigmoidoscopy or colonoscopy. You may have a sigmoidoscopy every 5 years or a colonoscopy every 10 years starting at age 33. Prostate cancer screening. Recommendations will vary depending on your family history and other risks. Hepatitis C blood test. Hepatitis B blood test. Sexually transmitted disease (STD) testing. Diabetes screening. This is done by checking your blood sugar (glucose) after you have not eaten for a while (fasting). You may have this done every 1-3 years. Abdominal aortic aneurysm (AAA) screening. You may need this if you are a current or former smoker. Osteoporosis. You may be screened starting at age 64 if you are at high risk. Talk with your health care provider about your test results, treatment options, and if necessary, the need for more tests. Vaccines  Your health care provider may recommend certain vaccines, such as: Influenza vaccine. This is recommended every year. Tetanus, diphtheria, and acellular pertussis (Tdap, Td) vaccine. You may need a Td booster every 10 years. Zoster vaccine. You may need this after age 93. Pneumococcal 13-valent conjugate (PCV13) vaccine. One dose is recommended after age 11. Pneumococcal polysaccharide (PPSV23) vaccine. One dose is recommended after age 3. Talk to your health care provider about which screenings and vaccines you need and how  often you need them. This information is not intended to replace  advice given to you by your health care provider. Make sure you discuss any questions you have with your health care provider. Document Released: 12/23/2015 Document Revised: 08/15/2016 Document Reviewed: 09/27/2015 Elsevier Interactive Patient Education  2017 [place] Prevention in the Home Falls can cause injuries. They can happen to people of all ages. There are many things you can do to make your home safe and to help prevent falls. What can I do on the outside of my home? Regularly fix the edges of walkways and driveways and fix any cracks. Remove anything that might make you trip as you walk through a door, such as a raised step or threshold. Trim any bushes or trees on the path to your home. Use bright outdoor lighting. Clear any walking paths of anything that might make someone trip, such as rocks or tools. Regularly check to see if handrails are loose or broken. Make sure that both sides of any steps have handrails. Any raised decks and porches should have guardrails on the edges. Have any leaves, snow, or ice cleared regularly. Use sand or salt on walking paths during winter. Clean up any spills in your garage right away. This includes oil or grease spills. What can I do in the bathroom? Use night lights. Install grab bars by the toilet and in the tub and shower. Do not use towel bars as grab bars. Use non-skid mats or decals in the tub or shower. If you need to sit down in the shower, use a plastic, non-slip stool. Keep the floor dry. Clean up any water that spills on the floor as soon as it happens. Remove soap buildup in the tub or shower regularly. Attach bath mats securely with double-sided non-slip rug tape. Do not have throw rugs and other things on the floor that can make you trip. What can I do in the bedroom? Use night lights. Make sure that you have a light by your bed that is easy to reach. Do not use any sheets or blankets that are too big for your bed.  They should not hang down onto the floor. Have a firm chair that has side arms. You can use this for support while you get dressed. Do not have throw rugs and other things on the floor that can make you trip. What can I do in the kitchen? Clean up any spills right away. Avoid walking on wet floors. Keep items that you use a lot in easy-to-reach places. If you need to reach something above you, use a strong step stool that has a grab bar. Keep electrical cords out of the way. Do not use floor polish or wax that makes floors slippery. If you must use wax, use non-skid floor wax. Do not have throw rugs and other things on the floor that can make you trip. What can I do with my stairs? Do not leave any items on the stairs. Make sure that there are handrails on both sides of the stairs and use them. Fix handrails that are broken or loose. Make sure that handrails are as long as the stairways. Check any carpeting to make sure that it is firmly attached to the stairs. Fix any carpet that is loose or worn. Avoid having throw rugs at the top or bottom of the stairs. If you do have throw rugs, attach them to the floor with carpet tape. Make sure that you have a  light switch at the top of the stairs and the bottom of the stairs. If you do not have them, ask someone to add them for you. What else can I do to help prevent falls? Wear shoes that: Do not have high heels. Have rubber bottoms. Are comfortable and fit you well. Are closed at the toe. Do not wear sandals. If you use a stepladder: Make sure that it is fully opened. Do not climb a closed stepladder. Make sure that both sides of the stepladder are locked into place. Ask someone to hold it for you, if possible. Clearly mark and make sure that you can see: Any grab bars or handrails. First and last steps. Where the edge of each step is. Use tools that help you move around (mobility aids) if they are needed. These  include: Canes. Walkers. Scooters. Crutches. Turn on the lights when you go into a dark area. Replace any light bulbs as soon as they burn out. Set up your furniture so you have a clear path. Avoid moving your furniture around. If any of your floors are uneven, fix them. If there are any pets around you, be aware of where they are. Review your medicines with your doctor. Some medicines can make you feel dizzy. This can increase your chance of falling. Ask your doctor what other things that you can do to help prevent falls. This information is not intended to replace advice given to you by your health care provider. Make sure you discuss any questions you have with your health care provider. Document Released: 09/22/2009 Document Revised: 05/03/2016 Document Reviewed: 12/31/2014 Elsevier Interactive Patient Education  2017 [employer].

## 2021-12-29 NOTE — Progress Notes (Signed)
Subjective:   Edward Thornton is a 76 y.o. male who presents for an Subsequent  Medicare Annual Wellness Visit.  Review of Systems     Cardiac Risk Factors include: advanced age (>57men, >6 women);hypertension;male gender;dyslipidemia     Objective:    Today's Vitals   There is no height or weight on file to calculate BMI.  Advanced Directives 12/29/2021 08/22/2021 11/08/2020 09/21/2019 09/19/2018 08/22/2016 08/02/2016  Does Patient Have a Medical Advance Directive? Yes No Yes Yes Yes Yes Yes  Type of Paramedic of [place];Living will - [place];Living will [place];Living will [place];Living will [place];Living will -  Does patient want to make changes to medical advance directive? - - No - Patient declined - - No - Patient declined -  Copy of [place] in Chart? No - copy requested - No - copy requested No - copy requested No - copy requested No - copy requested No - copy requested  Would patient like information on creating a medical advance directive? - No - Patient declined - - - - -    Current Medications (verified) Outpatient Encounter Medications as of 12/29/2021  Medication Sig   acetaminophen (TYLENOL) 500 MG tablet Take 1,000 mg by mouth every 6 (six) hours as needed for mild pain.   amLODipine (NORVASC) 10 MG tablet TAKE 1 TABLET BY MOUTH  DAILY   atorvastatin (LIPITOR) 40 MG tablet Take 1 tablet (40 mg total) by mouth daily.   cholecalciferol (VITAMIN D) 1000 units tablet Take 1 tablet (1,000 Units total) by mouth 2 (two) times daily. Takes 2 tablets twice daily (Patient taking differently: Take 2,000 Units by mouth 2 (two) times daily.)   clopidogrel (PLAVIX) 75 MG tablet Take 1 tablet (75 mg total) by mouth daily.   Menthol-Methyl Salicylate (SALONPAS PAIN RELIEF PATCH EX) Apply 1 patch topically daily as needed (knee pain).   metoprolol succinate  (TOPROL-XL) 50 MG 24 hr tablet Take 1 tablet (50 mg total) by mouth daily. Take with or immediately following a meal.   Omega-3 Fatty Acids (FISH OIL) 1200 MG CAPS Take 1 capsule (1,200 mg total) by mouth daily. Take 1 capsule daily (Patient taking differently: Take 1,000 mg by mouth daily.)   triamterene-hydrochlorothiazide (MAXZIDE-25) 37.5-25 MG tablet Take 1 tablet by mouth daily.   Turmeric 500 MG TABS Take 500 mg by mouth. Take twice per day   No facility-administered encounter medications on file as of 12/29/2021.    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   Hyperlipidemia    Hypertension    Past Surgical History:  Procedure Laterality Date   adominal hernia  1990   HERNIA REPAIR Left 12/10/1997   KNEE ARTHROSCOPY  1990   right   KNEE ARTHROSCOPY  2010   left   PILONIDAL CYST EXCISION  1980   Family History  Problem Relation Age of Onset   Stroke Mother    Heart disease Mother    Stroke Father    Heart disease Father    Colon cancer Neg Hx    Stomach cancer Neg Hx    Social History   Socioeconomic History   Marital status: Married    Spouse name: Not on file   Number of children: 2   Years of education: Not on file   Highest  education level: Not on file  Occupational History   Occupation: Insurance account manager, part-time/ retired  Tobacco Use   Smoking status: Every Day    Packs/day: 1.00    Years: 50.00    Pack years: 50.00    Types: Cigarettes    Start date: 12/10/1964   Smokeless tobacco: Never  Vaping Use   Vaping Use: Never used  Substance and Sexual Activity   Alcohol use: Yes    Alcohol/week: 3.0 standard drinks    Types: 3 Cans of beer per week    Comment: drinks beer; 3 or 4 a week    Drug use: No   Sexual activity: Yes  Other Topics Concern   Not on file  Social History Narrative   Not on file   Social Determinants of Health   Financial Resource  Strain: Low Risk    Difficulty of Paying Living Expenses: Not hard at all  Food Insecurity: No Food Insecurity   Worried About Charity fundraiser in the Last Year: Never true   [place] in the Last Year: Never true  Transportation Needs: No Transportation Needs   Lack of Transportation (Medical): No   Lack of Transportation (Non-Medical): No  Physical Activity: Insufficiently Active   Days of Exercise per Week: 3 days   Minutes of Exercise per Session: 30 min  Stress: No Stress Concern Present   Feeling of Stress : Not at all  Social Connections: Moderately Integrated   Frequency of Communication with Friends and Family: Twice a week   Frequency of Social Gatherings with Friends and Family: Twice a week   Attends Religious Services: More than 4 times per year   Active Member of [employer] or Organizations: No   Attends Music therapist: Never   Marital Status: Married    Tobacco Counseling Ready to quit: Not Answered Counseling given: Not Answered   Clinical Intake:  Pre-visit preparation completed: Yes  Pain : No/denies pain     Nutritional Risks: None Diabetes: No  How often do you need to have someone help you when you read instructions, pamphlets, or other written materials from your doctor or pharmacy?: 1 - Never What is the last grade level you completed in school?: 11th  Diabetic?no  Interpreter Needed?: No  Information entered by :: [place] of Daily Living In your present state of health, do you have any difficulty performing the following activities: 12/29/2021 08/21/2021  Hearing? N Y  Vision? N N  Difficulty concentrating or making decisions? N N  Walking or climbing stairs? N N  Dressing or bathing? N N  Doing errands, shopping? N N  Preparing Food and eating ? N -  Using the Toilet? N -  In the past six months, have you accidently leaked urine? N -  Do you have problems with loss of bowel control? N -  Managing  your Medications? N -  Managing your Finances? N -  Housekeeping or managing your Housekeeping? N -  Some recent data might be hidden    Patient Care Team: Hoyt Koch, MD as PCP - General (Internal Medicine) Rolm Bookbinder, MD as Consulting Physician (Dermatology) Magdalen Spatz, NP as Nurse Practitioner (Pulmonary Disease) Charlton Haws, [facility] as Pharmacist (Pharmacist)  Indicate any recent Medical Services you may have received from other than [facility] providers in the past year (date may be approximate).     Assessment:   This is a routine wellness examination for  [place].  Hearing/Vision screen Vision Screening - Comments:: Annual eye exams wears glasses   Dietary issues and exercise activities discussed: Current Exercise Habits: Home exercise routine, Type of exercise: walking, Time (Minutes): 30, Frequency (Times/Week): 3, Weekly Exercise (Minutes/Week): 90, Intensity: Mild, Exercise limited by: orthopedic condition(s)   Goals Addressed             This Visit's Progress    Patient Stated   On track    Monitor what I eat more closely and increase the amount of physical activity I do. Maintain current health status, stay as healthy and as independent as possible.       Depression Screen PHQ 2/9 Scores 12/29/2021 12/29/2021 09/26/2021 11/08/2020 09/21/2019 09/19/2018 09/16/2017  PHQ - 2 Score 0 0 0 0 0 0 0    Fall Risk Fall Risk  12/29/2021 11/09/2021 11/08/2020 09/21/2019 09/19/2018  Falls in the past year? 0 0 1 0 No  Number falls in past yr: 0 0 0 0 -  Injury with Fall? 0 0 0 0 -  Risk for fall due to : - - No Fall Risks - -  Follow up Falls evaluation completed - Falls evaluation completed - -    FALL RISK PREVENTION PERTAINING TO THE HOME:  Any stairs in or around the home? Yes  If so, are there any without handrails? No  Home free of loose throw rugs in walkways, pet beds, electrical cords, etc? Yes  Adequate lighting in your home to reduce risk of  falls? Yes   ASSISTIVE DEVICES UTILIZED TO PREVENT FALLS:  Life alert? No  Use of a cane, walker or w/c? Yes  Grab bars in the bathroom? Yes  Shower chair or bench in shower? Yes  Elevated toilet seat or a handicapped toilet? Yes    Cognitive Function:Normal cognitive status assessed by direct observation by this Nurse Health Advisor. No abnormalities found.   MMSE - Mini Mental State Exam 09/19/2018 08/02/2016  Not completed: Refused (No Data)     6CIT Screen 11/08/2020  What Year? 0 points  What month? 0 points  What time? 0 points  Count back from 20 0 points  Months in reverse 0 points  Repeat phrase 0 points  Total Score 0    Immunizations Immunization History  Administered Date(s) Administered   Fluad Quad(high Dose 65+) 08/21/2019, 08/22/2021   Influenza Split 10/10/2014, 08/23/2015   Influenza, High Dose Seasonal PF 08/24/2016, 09/16/2017, 09/19/2018   Influenza,inj,Quad PF,6+ Mos 08/23/2015   Influenza-Unspecified 10/02/2011, 09/13/2012, 09/14/2013, 09/29/2014, 08/11/2019, 08/28/2020, 08/24/2021   PFIZER(Purple Top)SARS-COV-2 Vaccination 01/15/2020, 02/09/2020, 09/10/2020, 04/12/2021   Pfizer Covid-19 Vaccine Bivalent Booster 24yrs & up 11/09/2021   Pneumococcal Conjugate-13 09/16/2017   Pneumococcal Polysaccharide-23 09/19/2018   Pneumococcal-Unspecified 06/05/2009   Td 06/05/2005   Tdap 12/04/2016, 06/09/2017   Zoster Recombinat (Shingrix) 11/24/2019, 04/11/2020   Zoster, Live 12/10/2008    TDAP status: Up to date  Flu Vaccine status: Up to date  Pneumococcal vaccine status: Up to date  Covid-19 vaccine status: Completed vaccines  Qualifies for Shingles Vaccine? Yes   Zostavax completed Yes   Shingrix Completed?: Yes  Screening Tests Health Maintenance  Topic Date Due   COLONOSCOPY (Pts 45-69yrs Insurance coverage will need to be confirmed)  07/25/2022   TETANUS/TDAP  06/10/2027   Pneumonia Vaccine 72+ Years old  Completed   INFLUENZA VACCINE   Completed   COVID-19 Vaccine  Completed   Hepatitis C Screening  Completed   Zoster Vaccines- Shingrix  Completed  HPV VACCINES  Aged Out    Health Maintenance  There are no preventive care reminders to display for this patient.  Colorectal cancer screening: Type of screening: Colonoscopy. Completed 07/25/2012. Repeat every 10 years  Lung Cancer Screening: (Low Dose CT Chest recommended if Age 67-80 years, 30 pack-year currently smoking OR have quit w/in 15years.) does qualify.   Lung Cancer Screening Referral: annual completed   Additional Screening:  Hepatitis C Screening: does not qualify; Completed 08/24/2016  Vision Screening: Recommended annual ophthalmology exams for early detection of glaucoma and other disorders of the eye. Is the patient up to date with their annual eye exam?  Yes  Who is the provider or what is the name of the office in which the patient attends annual eye exams? Dr.McQuen  If pt is not established with a provider, would they like to be referred to a provider to establish care? No .   Dental Screening: Recommended annual dental exams for proper oral hygiene  Community Resource Referral / Chronic Care Management: CRR required this visit?  No   CCM required this visit?  No      Plan:     I have personally reviewed and noted the following in the patients chart:   Medical and social history Use of alcohol, tobacco or illicit drugs  Current medications and supplements including opioid prescriptions. Patient is not currently taking opioid prescriptions. Functional ability and status Nutritional status Physical activity Advanced directives List of other physicians Hospitalizations, surgeries, and ER visits in previous 12 months Vitals Screenings to include cognitive, depression, and falls Referrals and appointments  In addition, I have reviewed and discussed with patient certain preventive protocols, quality metrics, and best practice  recommendations. A written personalized care plan for preventive services as well as general preventive health recommendations were provided to patient.     Randel Pigg, LPN   [id number]   Nurse Notes: none

## 2022-01-01 NOTE — Progress Notes (Signed)
Subjective:   Edward Thornton is a 76 y.o. male who presents for an Subsequent  Medicare Annual Wellness Visit.  I connected with Edward Thornton today by telephone and verified that I am speaking with the correct person using two identifiers. Location patient: home Location provider: work Persons participating in the virtual visit: patient, provider.   I discussed the limitations, risks, security and privacy concerns of performing an evaluation and management service by telephone and the availability of in person appointments. I also discussed with the patient that there may be a patient responsible charge related to this service. The patient expressed understanding and verbally consented to this telephonic visit.    Interactive audio and video telecommunications were attempted between this provider and patient, however failed, due to patient having technical difficulties OR patient did not have access to video capability.  We continued and completed visit with audio only.     Review of Systems     Cardiac Risk Factors include: advanced age (>53men, >73 women);hypertension;male gender;dyslipidemia     Objective:    Today's Vitals   There is no height or weight on file to calculate BMI.  Advanced Directives 12/29/2021 08/22/2021 11/08/2020 09/21/2019 09/19/2018 08/22/2016 08/02/2016  Does Patient Have a Medical Advance Directive? Yes No Yes Yes Yes Yes Yes  Type of Paramedic of [place];Living will - [place];Living will [place];Living will [place];Living will [place];Living will -  Does patient want to make changes to medical advance directive? - - No - Patient declined - - No - Patient declined -  Copy of [place] in Chart? No - copy requested - No - copy requested No - copy requested No - copy requested No - copy requested No - copy requested  Would patient like  information on creating a medical advance directive? - No - Patient declined - - - - -    Current Medications (verified) Outpatient Encounter Medications as of 12/29/2021  Medication Sig   acetaminophen (TYLENOL) 500 MG tablet Take 1,000 mg by mouth every 6 (six) hours as needed for mild pain.   amLODipine (NORVASC) 10 MG tablet TAKE 1 TABLET BY MOUTH  DAILY   atorvastatin (LIPITOR) 40 MG tablet Take 1 tablet (40 mg total) by mouth daily.   cholecalciferol (VITAMIN D) 1000 units tablet Take 1 tablet (1,000 Units total) by mouth 2 (two) times daily. Takes 2 tablets twice daily (Patient taking differently: Take 2,000 Units by mouth 2 (two) times daily.)   clopidogrel (PLAVIX) 75 MG tablet Take 1 tablet (75 mg total) by mouth daily.   Menthol-Methyl Salicylate (SALONPAS PAIN RELIEF PATCH EX) Apply 1 patch topically daily as needed (knee pain).   metoprolol succinate (TOPROL-XL) 50 MG 24 hr tablet Take 1 tablet (50 mg total) by mouth daily. Take with or immediately following a meal.   Omega-3 Fatty Acids (FISH OIL) 1200 MG CAPS Take 1 capsule (1,200 mg total) by mouth daily. Take 1 capsule daily (Patient taking differently: Take 1,000 mg by mouth daily.)   triamterene-hydrochlorothiazide (MAXZIDE-25) 37.5-25 MG tablet Take 1 tablet by mouth daily.   Turmeric 500 MG TABS Take 500 mg by mouth. Take twice per day   No facility-administered encounter medications on file as of 12/29/2021.    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  Hyperlipidemia    Hypertension    Past Surgical History:  Procedure Laterality Date   adominal hernia  1990   HERNIA REPAIR Left 12/10/1997   KNEE ARTHROSCOPY  1990   right   KNEE ARTHROSCOPY  2010   left   PILONIDAL CYST EXCISION  1980   Family History  Problem Relation Age of Onset   Stroke Mother    Heart disease Mother    Stroke Father    Heart disease Father     Colon cancer Neg Hx    Stomach cancer Neg Hx    Social History   Socioeconomic History   Marital status: Married    Spouse name: Not on file   Number of children: 2   Years of education: Not on file   Highest education level: Not on file  Occupational History   Occupation: Insurance account manager, part-time/ retired  Tobacco Use   Smoking status: Every Day    Packs/day: 1.00    Years: 50.00    Pack years: 50.00    Types: Cigarettes    Start date: 12/10/1964   Smokeless tobacco: Never  Vaping Use   Vaping Use: Never used  Substance and Sexual Activity   Alcohol use: Yes    Alcohol/week: 3.0 standard drinks    Types: 3 Cans of beer per week    Comment: drinks beer; 3 or 4 a week    Drug use: No   Sexual activity: Yes  Other Topics Concern   Not on file  Social History Narrative   Not on file   Social Determinants of Health   Financial Resource Strain: Low Risk    Difficulty of Paying Living Expenses: Not hard at all  Food Insecurity: No Food Insecurity   Worried About Charity fundraiser in the Last Year: Never true   [place] in the Last Year: Never true  Transportation Needs: No Transportation Needs   Lack of Transportation (Medical): No   Lack of Transportation (Non-Medical): No  Physical Activity: Insufficiently Active   Days of Exercise per Week: 3 days   Minutes of Exercise per Session: 30 min  Stress: No Stress Concern Present   Feeling of Stress : Not at all  Social Connections: Moderately Integrated   Frequency of Communication with Friends and Family: Twice a week   Frequency of Social Gatherings with Friends and Family: Twice a week   Attends Religious Services: More than 4 times per year   Active Member of [employer] or Organizations: No   Attends Music therapist: Never   Marital Status: Married    Tobacco Counseling Ready to quit: Not Answered Counseling given: Not Answered   Clinical Intake:  Pre-visit preparation completed:  Yes  Pain : No/denies pain     Nutritional Risks: None Diabetes: No  How often do you need to have someone help you when you read instructions, pamphlets, or other written materials from your doctor or pharmacy?: 1 - Never What is the last grade level you completed in school?: 11th  Diabetic?no  Interpreter Needed?: No  Information entered by :: [place] of Daily Living In your present state of health, do you have any difficulty performing the following activities: 12/29/2021 08/21/2021  Hearing? N Y  Vision? N N  Difficulty concentrating or making decisions? N N  Walking or climbing stairs? N N  Dressing or bathing? N N  Doing errands, shopping? N N  Preparing Food and eating ?  N -  Using the Toilet? N -  In the past six months, have you accidently leaked urine? N -  Do you have problems with loss of bowel control? N -  Managing your Medications? N -  Managing your Finances? N -  Housekeeping or managing your Housekeeping? N -  Some recent data might be hidden    Patient Care Team: Hoyt Koch, MD as PCP - General (Internal Medicine) Rolm Bookbinder, MD as Consulting Physician (Dermatology) Magdalen Spatz, NP as Nurse Practitioner (Pulmonary Disease) Charlton Haws, [facility] as Pharmacist (Pharmacist)  Indicate any recent Medical Services you may have received from other than [facility] providers in the past year (date may be approximate).     Assessment:   This is a routine wellness examination for [place].  Hearing/Vision screen Vision Screening - Comments:: Annual eye exams wears glasses   Dietary issues and exercise activities discussed: Current Exercise Habits: Home exercise routine, Type of exercise: walking, Time (Minutes): 30, Frequency (Times/Week): 3, Weekly Exercise (Minutes/Week): 90, Intensity: Mild, Exercise limited by: orthopedic condition(s)   Goals Addressed             This Visit's Progress    Patient Stated   On track     Monitor what I eat more closely and increase the amount of physical activity I do. Maintain current health status, stay as healthy and as independent as possible.      Depression Screen PHQ 2/9 Scores 12/29/2021 12/29/2021 09/26/2021 11/08/2020 09/21/2019 09/19/2018 09/16/2017  PHQ - 2 Score 0 0 0 0 0 0 0    Fall Risk Fall Risk  12/29/2021 11/09/2021 11/08/2020 09/21/2019 09/19/2018  Falls in the past year? 0 0 1 0 No  Number falls in past yr: 0 0 0 0 -  Injury with Fall? 0 0 0 0 -  Risk for fall due to : - - No Fall Risks - -  Follow up Falls evaluation completed - Falls evaluation completed - -    FALL RISK PREVENTION PERTAINING TO THE HOME:  Any stairs in or around the home? Yes  If so, are there any without handrails? No  Home free of loose throw rugs in walkways, pet beds, electrical cords, etc? Yes  Adequate lighting in your home to reduce risk of falls? Yes   ASSISTIVE DEVICES UTILIZED TO PREVENT FALLS:  Life alert? No  Use of a cane, walker or w/c? Yes  Grab bars in the bathroom? Yes  Shower chair or bench in shower? Yes  Elevated toilet seat or a handicapped toilet? Yes    Cognitive Function:Normal cognitive status assessed by direct observation by this Nurse Health Advisor. No abnormalities found.   MMSE - Mini Mental State Exam 09/19/2018 08/02/2016  Not completed: Refused (No Data)     6CIT Screen 11/08/2020  What Year? 0 points  What month? 0 points  What time? 0 points  Count back from 20 0 points  Months in reverse 0 points  Repeat phrase 0 points  Total Score 0    Immunizations Immunization History  Administered Date(s) Administered   Fluad Quad(high Dose 65+) 08/21/2019, 08/22/2021   Influenza Split 10/10/2014, 08/23/2015   Influenza, High Dose Seasonal PF 08/24/2016, 09/16/2017, 09/19/2018   Influenza,inj,Quad PF,6+ Mos 08/23/2015   Influenza-Unspecified 10/02/2011, 09/13/2012, 09/14/2013, 09/29/2014, 08/11/2019, 08/28/2020, 08/24/2021    PFIZER(Purple Top)SARS-COV-2 Vaccination 01/15/2020, 02/09/2020, 09/10/2020, 04/12/2021   Pfizer Covid-19 Vaccine Bivalent Booster 70yrs & up 11/09/2021   Pneumococcal Conjugate-13 09/16/2017   Pneumococcal  Polysaccharide-23 09/19/2018   Pneumococcal-Unspecified 06/05/2009   Td 06/05/2005   Tdap 12/04/2016, 06/09/2017   Zoster Recombinat (Shingrix) 11/24/2019, 04/11/2020   Zoster, Live 12/10/2008    TDAP status: Up to date  Flu Vaccine status: Up to date  Pneumococcal vaccine status: Up to date  Covid-19 vaccine status: Completed vaccines  Qualifies for Shingles Vaccine? Yes   Zostavax completed Yes   Shingrix Completed?: Yes  Screening Tests Health Maintenance  Topic Date Due   COLONOSCOPY (Pts 45-11yrs Insurance coverage will need to be confirmed)  07/25/2022   TETANUS/TDAP  06/10/2027   Pneumonia Vaccine 68+ Years old  Completed   INFLUENZA VACCINE  Completed   COVID-19 Vaccine  Completed   Hepatitis C Screening  Completed   Zoster Vaccines- Shingrix  Completed   HPV VACCINES  Aged Out    Health Maintenance  There are no preventive care reminders to display for this patient.  Colorectal cancer screening: Type of screening: Colonoscopy. Completed 07/25/2012. Repeat every 10 years  Lung Cancer Screening: (Low Dose CT Chest recommended if Age 75-80 years, 30 pack-year currently smoking OR have quit w/in 15years.) does qualify.   Lung Cancer Screening Referral: annual completed   Additional Screening:  Hepatitis C Screening: does not qualify; Completed 08/24/2016  Vision Screening: Recommended annual ophthalmology exams for early detection of glaucoma and other disorders of the eye. Is the patient up to date with their annual eye exam?  Yes  Who is the provider or what is the name of the office in which the patient attends annual eye exams? Dr.McQuen  If pt is not established with a provider, would they like to be referred to a provider to establish care? No .    Dental Screening: Recommended annual dental exams for proper oral hygiene  Community Resource Referral / Chronic Care Management: CRR required this visit?  No   CCM required this visit?  No      Plan:     I have personally reviewed and noted the following in the patients chart:   Medical and social history Use of alcohol, tobacco or illicit drugs  Current medications and supplements including opioid prescriptions. Patient is not currently taking opioid prescriptions. Functional ability and status Nutritional status Physical activity Advanced directives List of other physicians Hospitalizations, surgeries, and ER visits in previous 12 months Vitals Screenings to include cognitive, depression, and falls Referrals and appointments  In addition, I have reviewed and discussed with patient certain preventive protocols, quality metrics, and best practice recommendations. A written personalized care plan for preventive services as well as general preventive health recommendations were provided to patient.     Randel Pigg, LPN   8/34/1961   Nurse Notes: none

## 2022-01-02 ENCOUNTER — Encounter: Payer: Self-pay | Admitting: Internal Medicine

## 2022-01-02 ENCOUNTER — Other Ambulatory Visit: Payer: Self-pay

## 2022-01-02 ENCOUNTER — Ambulatory Visit (INDEPENDENT_AMBULATORY_CARE_PROVIDER_SITE_OTHER): Payer: Medicare Other | Admitting: Internal Medicine

## 2022-01-02 VITALS — BP 136/70 | HR 56 | Resp 18 | Ht 72.0 in | Wt 178.8 lb

## 2022-01-02 DIAGNOSIS — I10 Essential (primary) hypertension: Secondary | ICD-10-CM | POA: Diagnosis not present

## 2022-01-02 DIAGNOSIS — Z8673 Personal history of transient ischemic attack (TIA), and cerebral infarction without residual deficits: Secondary | ICD-10-CM

## 2022-01-02 DIAGNOSIS — Z720 Tobacco use: Secondary | ICD-10-CM

## 2022-01-02 LAB — COMPREHENSIVE METABOLIC PANEL
ALT: 16 U/L (ref 0–53)
AST: 16 U/L (ref 0–37)
Albumin: 4 g/dL (ref 3.5–5.2)
Alkaline Phosphatase: 72 U/L (ref 39–117)
BUN: 17 mg/dL (ref 6–23)
CO2: 32 mEq/L (ref 19–32)
Calcium: 9.9 mg/dL (ref 8.4–10.5)
Chloride: 102 mEq/L (ref 96–112)
Creatinine, Ser: 1 mg/dL (ref 0.40–1.50)
GFR: 73.96 mL/min (ref >60.00)
Glucose, Bld: 95 mg/dL (ref 70–99)
Potassium: 3.8 mEq/L (ref 3.5–5.1)
Sodium: 139 mEq/L (ref 135–145)
Total Bilirubin: 0.9 mg/dL (ref 0.2–1.2)
Total Protein: 7.1 g/dL (ref 6.0–8.3)

## 2022-01-02 MED ORDER — METOPROLOL SUCCINATE ER 50 MG PO TB24: 50.0000 mg | ORAL_TABLET | Freq: Every day | ORAL | 3 refills | Status: DC

## 2022-01-02 NOTE — Assessment & Plan Note (Signed)
BP is at goal <140/90. No diabetes. Taking lipitor and plavix without side effects.

## 2022-01-02 NOTE — Progress Notes (Signed)
° °  Subjective:   Patient ID: Edward Thornton, male    DOB: Dec 12, 1945, 76 y.o.   MRN: [id number]  HPI The patient is a 76 YO man coming in for follow up BP.  Review of Systems  Constitutional:  Positive for fatigue.  HENT: Negative.    Eyes: Negative.   Respiratory:  Negative for cough, chest tightness and shortness of breath.   Cardiovascular:  Negative for chest pain, palpitations and leg swelling.  Gastrointestinal:  Negative for abdominal distention, abdominal pain, constipation, diarrhea, nausea and vomiting.  Musculoskeletal: Negative.   Skin: Negative.   Neurological: Negative.   Psychiatric/Behavioral: Negative.     Objective:  Physical Exam Constitutional:      Appearance: He is well-developed.  HENT:     Head: Normocephalic and atraumatic.  Cardiovascular:     Rate and Rhythm: Normal rate and regular rhythm.  Pulmonary:     Effort: Pulmonary effort is normal. No respiratory distress.     Breath sounds: Normal breath sounds. No wheezing or rales.  Abdominal:     General: Bowel sounds are normal. There is no distension.     Palpations: Abdomen is soft.     Tenderness: There is no abdominal tenderness. There is no rebound.  Musculoskeletal:     Cervical back: Normal range of motion.  Skin:    General: Skin is warm and dry.  Neurological:     Mental Status: He is alert and oriented to person, place, and time.     Coordination: Coordination normal.    Vitals:   01/02/22 1048  BP: 136/70  Pulse: (!) 56  Resp: 18  SpO2: 97%  Weight: 178 lb 12.8 oz (81.1 kg)  Height: 6' (1.829 m)    This visit occurred during the SARS-CoV-2 public health emergency.  Safety protocols were in place, including screening questions prior to the visit, additional usage of staff PPE, and extensive cleaning of exam room while observing appropriate contact time as indicated for disinfecting solutions.   Assessment & Plan:

## 2022-01-02 NOTE — Assessment & Plan Note (Signed)
Counseled and he is reducing. Not ready to quit yet.

## 2022-01-02 NOTE — Assessment & Plan Note (Signed)
BP at goal on hctz 25 mg daily and metoprolol 50 mg daily and amlodipine 10 mg daily. Due for CMP today as we switched from hctz and kdur 20 mEq daily to triamterene/hctz about 1 month ago. Adjust as needed.

## 2022-01-02 NOTE — Patient Instructions (Addendum)
We will check the labs today. 

## 2022-01-19 ENCOUNTER — Telehealth: Payer: Medicare Other

## 2022-01-25 DIAGNOSIS — D485 Neoplasm of uncertain behavior of skin: Secondary | ICD-10-CM | POA: Diagnosis not present

## 2022-01-25 DIAGNOSIS — L570 Actinic keratosis: Secondary | ICD-10-CM | POA: Diagnosis not present

## 2022-01-25 DIAGNOSIS — L821 Other seborrheic keratosis: Secondary | ICD-10-CM | POA: Diagnosis not present

## 2022-01-25 DIAGNOSIS — D1801 Hemangioma of skin and subcutaneous tissue: Secondary | ICD-10-CM | POA: Diagnosis not present

## 2022-01-25 DIAGNOSIS — I788 Other diseases of capillaries: Secondary | ICD-10-CM | POA: Diagnosis not present

## 2022-01-25 DIAGNOSIS — L817 Pigmented purpuric dermatosis: Secondary | ICD-10-CM | POA: Diagnosis not present

## 2022-01-25 DIAGNOSIS — D2371 Other benign neoplasm of skin of right lower limb, including hip: Secondary | ICD-10-CM | POA: Diagnosis not present

## 2022-01-25 DIAGNOSIS — L738 Other specified follicular disorders: Secondary | ICD-10-CM | POA: Diagnosis not present

## 2022-01-25 DIAGNOSIS — C8400 Mycosis fungoides, unspecified site: Secondary | ICD-10-CM | POA: Diagnosis not present

## 2022-01-25 DIAGNOSIS — Z85828 Personal history of other malignant neoplasm of skin: Secondary | ICD-10-CM | POA: Diagnosis not present

## 2022-01-25 DIAGNOSIS — D225 Melanocytic nevi of trunk: Secondary | ICD-10-CM | POA: Diagnosis not present

## 2022-01-25 DIAGNOSIS — L814 Other melanin hyperpigmentation: Secondary | ICD-10-CM | POA: Diagnosis not present

## 2022-02-25 ENCOUNTER — Other Ambulatory Visit: Payer: Self-pay | Admitting: Internal Medicine

## 2022-02-25 DIAGNOSIS — I10 Essential (primary) hypertension: Secondary | ICD-10-CM

## 2022-03-07 ENCOUNTER — Encounter: Payer: Self-pay | Admitting: Internal Medicine

## 2022-03-07 ENCOUNTER — Ambulatory Visit (INDEPENDENT_AMBULATORY_CARE_PROVIDER_SITE_OTHER): Payer: Medicare Other | Admitting: Internal Medicine

## 2022-03-07 DIAGNOSIS — K649 Unspecified hemorrhoids: Secondary | ICD-10-CM | POA: Insufficient documentation

## 2022-03-07 MED ORDER — HYDROCORTISONE (PERIANAL) 2.5 % EX CREA: 1.0000 "application " | TOPICAL_CREAM | Freq: Two times a day (BID) | CUTANEOUS | 0 refills | Status: DC

## 2022-03-07 NOTE — Assessment & Plan Note (Signed)
Has prior colonoscopy 2013 with internal and external hemorrhoid. Some external hemorrhoid reviewed and could be prolapsing internal as well since he is not having pain or burning or itching. Rx anusol ointment to treat and if no improvement he will let us know and discussed other treatment options. ?

## 2022-03-07 NOTE — Progress Notes (Signed)
? ?  Subjective:  ? ?Patient ID: Edward Thornton, male    DOB: 10-May-1946, 76 y.o.   MRN: [id number] ? ?HPI ?The patient is a 76 YO man coming in for hemorrhoids. ? ?Review of Systems  ?Constitutional: Negative.   ?HENT: Negative.    ?Eyes: Negative.   ?Respiratory:  Negative for cough, chest tightness and shortness of breath.   ?Cardiovascular:  Negative for chest pain, palpitations and leg swelling.  ?Gastrointestinal:  Positive for anal bleeding and blood in stool. Negative for abdominal distention, abdominal pain, constipation, diarrhea, nausea and vomiting.  ?Musculoskeletal: Negative.   ?Skin: Negative.   ?Neurological: Negative.   ?Psychiatric/Behavioral: Negative.    ? ?Objective:  ?Physical Exam ?Constitutional:   ?   Appearance: He is well-developed.  ?HENT:  ?   Head: Normocephalic and atraumatic.  ?Pulmonary:  ?   Effort: Pulmonary effort is normal. No respiratory distress.  ?Abdominal:  ?   General: Bowel sounds are normal. There is no distension.  ?   Palpations: Abdomen is soft.  ?   Tenderness: There is no abdominal tenderness. There is no rebound.  ?Genitourinary: ?   Comments: Reviewed photo of anus taken by patient with large external hemorrhoid 4-5 o clock position ?Musculoskeletal:  ?   Cervical back: Normal range of motion.  ?Skin: ?   General: Skin is warm and dry.  ?Neurological:  ?   Mental Status: He is alert and oriented to person, place, and time.  ?   Coordination: Coordination normal.  ? ? ?Vitals:  ? 03/07/22 1039  ?BP: 122/66  ?Pulse: 92  ?Resp: 18  ?SpO2: 98%  ?Weight: 180 lb (81.6 kg)  ?Height: 6' (1.829 m)  ? ? ?This visit occurred during the SARS-CoV-2 public health emergency.  Safety protocols were in place, including screening questions prior to the visit, additional usage of staff PPE, and extensive cleaning of exam room while observing appropriate contact time as indicated for disinfecting solutions.  ? ?Assessment & Plan:  ? ?

## 2022-03-07 NOTE — Patient Instructions (Signed)
We have sent in the cream to use externally twice a day for 1-2 weeks to clear the hemorrhoids. ? ? ?

## 2022-03-20 ENCOUNTER — Ambulatory Visit (INDEPENDENT_AMBULATORY_CARE_PROVIDER_SITE_OTHER): Payer: Medicare Other

## 2022-03-20 DIAGNOSIS — E782 Mixed hyperlipidemia: Secondary | ICD-10-CM

## 2022-03-20 DIAGNOSIS — I10 Essential (primary) hypertension: Secondary | ICD-10-CM

## 2022-03-20 NOTE — Patient Instructions (Signed)
Visit Information ? ?Following are the goals we discussed today:  ? ?Manage My Medicine  ? ?Timeframe:  Long-Range Goal ?Priority:  Medium ?Start Date:       01/27/21                      ?Expected End Date:   03/21/2023                  ? ?Follow Up Date 09/19/2022 ?  ?- call for medicine refill 2 or 3 days before it runs out ?- call if I am sick and can't take my medicine ?- keep a list of all the medicines I take; vitamins and herbals too ?- use a pillbox to sort medicine  ? ?Why is this important?   ?These steps will help you keep on track with your medicines. ? ?Plan: Telephone follow up appointment with care management team member scheduled for:  6 months ?The patient has been provided with contact information for the care management team and has been advised to call with any health related questions or concerns.  ? ?Tomasa Blase, PharmD ?Clinical Pharmacist, [place]  ? ?Please call the care guide team at [phone] if you need to cancel or reschedule your appointment.  ? ?Patient verbalizes understanding of instructions and care plan provided today and agrees to view in [facility]. Active MyChart status confirmed with patient.   ? ?

## 2022-03-20 NOTE — Progress Notes (Signed)
? ?Chronic Care Management ?Pharmacy Note ? ?03/20/2022 ?Name:  Edward Thornton MRN:  [id number] DOB:  1946/08/10 ? ?Summary: ?-Patient reports compliance to current medications ?-Reports that BP when checked at home has been well controlled, averaging 120-130/80's ?-Has been using anusol cream notes that he has not seen a major improvement in hemorrhoids since starting - had blood in stool yesterday after working in yard / lifting things the day prior   ? ?Recommendations/Changes made from today's visit: ?-recommending no changes to medications at this time  ?-Advised for patient to trial stool softener to help relieve irritation to hemorrhoids when having a BM - patient reports he is not straining to go, likely irritated when working in yard ?-Patient to continue to monitor hemorrhoids and reach out to office should he continue to have issues  ? ?Plan: ?- F/u in 6 months  ? ?Subjective: ?Edward Thornton is an 76 y.o. year old male who is a primary patient of Hoyt Koch, MD.  The CCM team was consulted for assistance with disease management and care coordination needs.   ? ?Engaged with patient by telephone for follow up visit in response to provider referral for pharmacy case management and/or care coordination services.  ? ?Consent to Services:  ?The patient was given the following information about Chronic Care Management services today, agreed to services, and gave verbal consent: 1. CCM service includes personalized support from designated clinical staff supervised by the primary care provider, including individualized plan of care and coordination with other care providers 2. 24/7 contact phone numbers for assistance for urgent and routine care needs. 3. Service will only be billed when office clinical staff spend 20 minutes or more in a month to coordinate care. 4. Only one practitioner may furnish and bill the service in a calendar month. 5.The patient may stop CCM services at any time (effective at the  end of the month) by phone call to the office staff. 6. The patient will be responsible for cost sharing (co-pay) of up to 20% of the service fee (after annual deductible is met). Patient agreed to services and consent obtained. ? ?Patient Care Team: ?Hoyt Koch, MD as PCP - General (Internal Medicine) ?Rolm Bookbinder, MD as Consulting Physician (Dermatology) ?Magdalen Spatz, NP as Nurse Practitioner (Pulmonary Disease) ?Foltanski, Cleaster Corin, [facility] as Pharmacist (Pharmacist) ? ?Patient lives at home with his wife. 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  ? ?Recent office visits: ?03/07/2022 - Dr. Sharlet Salina - hemorrhoids - anusol 2.5% rx'd  ?01/02/2022 - Dr. Sharlet Salina - no changes to medications  ?12/07/2021 - Dr. Jenny Reichmann - stop hydralazine, start metoprolol succinate 40m daily  ?11/09/2021 - Dr. CSharlet Salina- stop hctz and potassium, start triamterene/hctz 37.5/25mg daily - f/u in 6-12 months ?09/25/2021 - Dr. CSharlet Salina- no changes to medications - f/u with neurology  ? ?Recent consult visits: ?09/26/2021 - Dr. MDarden Dates- Neurology - TIA 08/20/2021 - no changes to current medications - f/u in 6 months  ? ?Hospital visits: ?None in previous 6 months ? ?Objective: ? ?Lab Results  ?Component Value Date  ? CREATININE 1.00 01/02/2022  ? BUN 17 01/02/2022  ? GFR 73.96 01/02/2022  ? GFRNONAA >60 09/05/2021  ? NA 139 01/02/2022  ? K 3.8 01/02/2022  ? CALCIUM 9.9 01/02/2022  ? CO2 32 01/02/2022  ? ? ?Lab Results  ?Component Value Date/Time  ? HGBA1C 5.9 (H) 08/21/2021 05:08 AM  ? HGBA1C 5.8 11/08/2020 10:05 AM  ?  GFR 73.96 01/02/2022 11:19 AM  ? GFR 84.59 11/09/2021 10:36 AM  ? MICROALBUR <0.7 11/08/2020 10:05 AM  ? MICROALBUR <0.7 09/21/2019 10:01 AM  ?  ?Last diabetic Eye exam: No results found for: HMDIABEYEEXA  ?Last diabetic Foot exam: No results found for: HMDIABFOOTEX  ? ?Lab Results  ?Component Value Date  ? CHOL 123 11/09/2021  ? HDL 42.90 11/09/2021  ? [place] 56 11/09/2021  ? LDLDIRECT 85.0 09/16/2017  ?  TRIG 119.0 11/09/2021  ? CHOLHDL 3 11/09/2021  ? ? ? ?  Latest Ref Rng & Units 01/02/2022  ? 11:19 AM 11/09/2021  ? 10:36 AM 09/19/2021  ? 11:04 AM  ?Hepatic Function  ?Total Protein 6.0 - 8.3 g/dL 7.1   6.8   6.6    ?Albumin 3.5 - 5.2 g/dL 4.0   3.9   4.4    ?AST 0 - 37 U/L 16   14   15     ?ALT 0 - 53 U/L 16   13   12     ?Alk Phosphatase 39 - 117 U/L 72   67   83    ?Total Bilirubin 0.2 - 1.2 mg/dL 0.9   0.8   0.7    ? ? ?Lab Results  ?Component Value Date/Time  ? TSH 1.590 09/19/2021 11:04 AM  ? TSH 2.02 11/08/2020 10:05 AM  ? ? ? ?  Latest Ref Rng & Units 11/09/2021  ? 10:36 AM 09/19/2021  ? 11:04 AM 09/05/2021  ? 12:05 PM  ?CBC  ?WBC 4.0 - 10.5 K/uL 7.7   6.1   6.9    ?Hemoglobin 13.0 - 17.0 g/dL 15.2   15.8   15.6    ?Hematocrit 39.0 - 52.0 % 45.6   46.8   46.7    ?Platelets 150.0 - 400.0 K/uL 268.0    302    ? ? ?No results found for: VD25OH ? ?Clinical ASCVD: No  ?The ASCVD Risk score (Arnett DK, et al., 2019) failed to calculate for the following reasons: ?  The valid total cholesterol range is 130 to 320 mg/dL   ? ? ?  12/29/2021  ? 10:35 AM 12/29/2021  ? 10:33 AM 09/26/2021  ?  1:46 PM  ?Depression screen PHQ 2/9  ?Decreased Interest 0 0 0  ?Down, Depressed, Hopeless 0 0 0  ?PHQ - 2 Score 0 0 0  ?  ? ? ?Social History  ? ?Tobacco Use  ?Smoking Status Every Day  ? Packs/day: 1.00  ? Years: 50.00  ? Pack years: 50.00  ? Types: Cigarettes  ? Start date: 12/10/1964  ?Smokeless Tobacco Never  ? ?BP Readings from Last 3 Encounters:  ?03/07/22 122/66  ?01/02/22 136/70  ?12/07/21 132/62  ? ?Pulse Readings from Last 3 Encounters:  ?03/07/22 92  ?01/02/22 (!) 56  ?12/07/21 74  ? ?Wt Readings from Last 3 Encounters:  ?03/07/22 180 lb (81.6 kg)  ?01/02/22 178 lb 12.8 oz (81.1 kg)  ?12/07/21 177 lb 12.8 oz (80.6 kg)  ? ? ?Assessment/Interventions: Review of patient past medical history, allergies, medications, health status, including review of consultants reports, laboratory and other test data, was performed as part of  comprehensive evaluation and provision of chronic care management services.  ? ?SDOH:  (Social Determinants of Health) assessments and interventions performed: Yes ? ? ?[place] ? ?Allergies  ?Allergen Reactions  ? Losartan Swelling  ? ? ?Medications Reviewed Today   ? ? Reviewed by Tomasa Blase, [facility] (Pharmacist) on 03/20/22 at  [place] List Status: <None>  ? ?Medication Order Taking? Sig Documenting Provider Last Dose Status Informant  ?acetaminophen (TYLENOL) 500 MG tablet [id number] Yes Take 1,000 mg by mouth every 6 (six) hours as needed for mild pain. [provider] Taking Active Self  ?amLODipine (NORVASC) 10 MG tablet [id number] Yes TAKE 1 TABLET BY MOUTH  DAILY Hoyt Koch, MD Taking Active   ?atorvastatin (LIPITOR) 40 MG tablet [id number] Yes Take 1 tablet (40 mg total) by mouth daily. Hoyt Koch, MD Taking Active   ?cholecalciferol (VITAMIN D) 1000 units tablet [id number] Yes Take 1 tablet (1,000 Units total) by mouth 2 (two) times daily. Takes 2 tablets twice daily  ?Patient taking differently: Take 2,000 Units by mouth daily.  ? Hoyt Koch, MD Taking Active Self  ?clopidogrel (PLAVIX) 75 MG tablet [id number] Yes Take 1 tablet (75 mg total) by mouth daily. Hoyt Koch, MD Taking Active   ?hydrocortisone (ANUSOL-HC) 2.5 % rectal cream [id number] Yes Place 1 application. rectally 2 (two) times daily. Hoyt Koch, MD Taking Active   ?Menthol-Methyl Salicylate (SALONPAS PAIN RELIEF PATCH EX) [id number] Yes Apply 1 patch topically daily as needed (knee pain). [provider] Taking Active Self  ?metoprolol succinate (TOPROL-XL) 50 MG 24 hr tablet [id number] Yes Take 1 tablet (50 mg total) by mouth daily. Take with or immediately following a meal. Hoyt Koch, MD Taking Active   ?Multiple Vitamin (MULTIVITAMIN) tablet [id number] Yes Take 1 tablet by mouth daily. [provider] Taking Active   ?Omega-3 Fatty Acids (FISH  OIL) 1200 MG CAPS [id number] Yes Take 1 capsule (1,200 mg total) by mouth daily. Take 1 capsule daily  ?Patient taking differently: Take 1,000 mg by mouth daily.  ? Hoyt Koch, MD Taking Active Self

## 2022-03-27 ENCOUNTER — Telehealth: Payer: Self-pay | Admitting: Gastroenterology

## 2022-03-27 NOTE — Telephone Encounter (Signed)
I saw his wife Ivin Booty today in the office and she mentioned that he is having significant difficulties with what sounds like a thrombosed external hemorrhoid.  She showed me a picture.  She is hoping for some guidance.  He already started taking some over-the-counter topical therapies from his primary care team. ? ? ?Patty, ?Can you please contact him tomorrow.  Offer first available extender appointment for what sounds like hemorrhoidal difficulty.  He will probably need a colonoscopy in the near future as well. ?

## 2022-03-27 NOTE — Telephone Encounter (Signed)
The pt has been scheduled for a New pt appt with Amanda on 04/04/22 at 915 am.  Call pt 4/18 ?

## 2022-03-28 NOTE — Telephone Encounter (Signed)
The appt was moved to 4/24 with [facility].  The pt has been advised.  ?

## 2022-04-02 ENCOUNTER — Ambulatory Visit: Payer: Medicare Other | Admitting: Physician Assistant

## 2022-04-02 ENCOUNTER — Encounter: Payer: Self-pay | Admitting: Physician Assistant

## 2022-04-02 VITALS — BP 122/62 | HR 60 | Ht 71.65 in | Wt 177.4 lb

## 2022-04-02 DIAGNOSIS — Z7901 Long term (current) use of anticoagulants: Secondary | ICD-10-CM

## 2022-04-02 DIAGNOSIS — K641 Second degree hemorrhoids: Secondary | ICD-10-CM

## 2022-04-02 DIAGNOSIS — Z1211 Encounter for screening for malignant neoplasm of colon: Secondary | ICD-10-CM | POA: Diagnosis not present

## 2022-04-02 DIAGNOSIS — Z1212 Encounter for screening for malignant neoplasm of rectum: Secondary | ICD-10-CM

## 2022-04-02 DIAGNOSIS — K625 Hemorrhage of anus and rectum: Secondary | ICD-10-CM | POA: Diagnosis not present

## 2022-04-02 MED ORDER — HYDROCORTISONE (PERIANAL) 2.5 % EX CREA: 1.0000 "application " | TOPICAL_CREAM | Freq: Two times a day (BID) | CUTANEOUS | 1 refills | Status: DC

## 2022-04-02 NOTE — Progress Notes (Signed)
I agree with the above note, plan 

## 2022-04-02 NOTE — Progress Notes (Signed)
? ?Chief Complaint: Hemorrhoids ? ?HPI: ?   Edward Thornton is a 76 year old Caucasian male with a past medical history as listed below including stroke (08/21/2021 echo with an LVEF 60-65%) on Plavix, known to Dr. Ardis Hughs, who was referred to me by Hoyt Koch, * for a complaint of hemorrhoids. ?   07/25/2012 colonoscopy for history of hyperplastic polyps 10 years prior with mild diverticulosis in the sigmoid to descending colon, internal and external hemorrhoids and otherwise normal.  Repeat recommended 10 years. ?   Today, the patient presents to clinic and brings with him pictures of what looks like a prolapsed internal hemorrhoid +/- thrombosed.  He tells me this is what it look like about a week ago, explains that he sees some bright red blood on the toilet paper after wiping over the past year off-and-on but over the past few months that seem to get worse.  Tells me that he always see a little bit of bright red blood after BM and when he is cleaning, but this increased in quantity on a couple of occasions.  He saw his PCP who started him on Hydrocortisone ointment twice daily which he used daily for 3 weeks and just finished about 3 days ago.  He was just applying this to the outside of the rectum.  He has continued to see some bright red blood.  Denies any rectal pain. ?   Denies fever, chills, change in bowel habits, abdominal pain or weight loss. ? ?Past Medical History:  ?Diagnosis Date  ? Arthritis   ? 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 Hyperlipidemia   ? Hypertension   ? ? ?Past Surgical History:  ?Procedure Laterality Date  ? adominal hernia  1990  ? HERNIA REPAIR Left 12/10/1997  ? KNEE ARTHROSCOPY  1990  ? right  ? KNEE ARTHROSCOPY  2010  ? left  ? PILONIDAL CYST EXCISION  1980  ? ? ?Current Outpatient Medications  ?Medication Sig Dispense Refill  ? acetaminophen (TYLENOL) 500 MG tablet Take 1,000 mg by mouth every 6 (six) hours as needed for mild pain.    ? amLODipine (NORVASC) 10 MG tablet  TAKE 1 TABLET BY MOUTH  DAILY 90 tablet 3  ? atorvastatin (LIPITOR) 40 MG tablet Take 1 tablet (40 mg total) by mouth daily. 90 tablet 3  ? cholecalciferol (VITAMIN D) 1000 units tablet Take 1 tablet (1,000 Units total) by mouth 2 (two) times daily. Takes 2 tablets twice daily (Patient taking differently: Take 2,000 Units by mouth daily.) 180 tablet 2  ? clopidogrel (PLAVIX) 75 MG tablet Take 1 tablet (75 mg total) by mouth daily. 90 tablet 3  ? hydrocortisone (ANUSOL-HC) 2.5 % rectal cream Place 1 application. rectally 2 (two) times daily. 30 g 0  ? Menthol-Methyl Salicylate (SALONPAS PAIN RELIEF PATCH EX) Apply 1 patch topically daily as needed (knee pain).    ? metoprolol succinate (TOPROL-XL) 50 MG 24 hr tablet Take 1 tablet (50 mg total) by mouth daily. Take with or immediately following a meal. 90 tablet 3  ? Multiple Vitamin (MULTIVITAMIN) tablet Take 1 tablet by mouth daily.    ? Omega-3 Fatty Acids (FISH OIL) 1200 MG CAPS Take 1 capsule (1,200 mg total) by mouth daily. Take 1 capsule daily (Patient taking differently: Take 1,000 mg by mouth daily.) 90 capsule 3  ? triamterene-hydrochlorothiazide (MAXZIDE-25) 37.5-25 MG tablet Take 1 tablet by mouth daily. 90 tablet 3  ? Turmeric 500 MG TABS Take 500 mg by  mouth. Take twice per day    ? ?No current facility-administered medications for this visit.  ? ? ?Allergies as of 04/02/2022 - Review Complete 03/20/2022  ?Allergen Reaction Noted  ? Losartan Swelling 09/05/2021  ? ? ?Family History  ?Problem Relation Age of Onset  ? Stroke Mother   ? Heart disease Mother   ? Stroke Father   ? Heart disease Father   ? Colon cancer Neg Hx   ? Stomach cancer Neg Hx   ? ? ?Social History  ? ?Socioeconomic History  ? Marital status: Married  ?  Spouse name: Not on file  ? Number of children: 2  ? Years of education: Not on file  ? Highest education level: Not on file  ?Occupational History  ? Occupation: Insurance account manager, part-time/ retired  ?Tobacco Use  ? Smoking status: Every  Day  ?  Packs/day: 1.00  ?  Years: 50.00  ?  Pack years: 50.00  ?  Types: Cigarettes  ?  Start date: 12/10/1964  ? Smokeless tobacco: Never  ?Vaping Use  ? Vaping Use: Never used  ?Substance and Sexual Activity  ? Alcohol use: Yes  ?  Alcohol/week: 3.0 standard drinks  ?  Types: 3 Cans of beer per week  ?  Comment: drinks beer; 3 or 4 a week   ? Drug use: No  ? Sexual activity: Yes  ?Other Topics Concern  ? Not on file  ?Social History Narrative  ? Not on file  ? ?Social Determinants of Health  ? ?Financial Resource Strain: Low Risk   ? Difficulty of Paying Living Expenses: Not hard at all  ?Food Insecurity: No Food Insecurity  ? Worried About Charity fundraiser in the Last Year: Never true  ? Ran Out of Food in the Last Year: Never true  ?Transportation Needs: No Transportation Needs  ? Lack of Transportation (Medical): No  ? Lack of Transportation (Non-Medical): No  ?Physical Activity: Insufficiently Active  ? Days of Exercise per Week: 3 days  ? Minutes of Exercise per Session: 30 min  ?Stress: No Stress Concern Present  ? Feeling of Stress : Not at all  ?Social Connections: Moderately Integrated  ? Frequency of Communication with Friends and Family: Twice a week  ? Frequency of Social Gatherings with Friends and Family: Twice a week  ? Attends Religious Services: More than 4 times per year  ? Active Member of Clubs or Organizations: No  ? Attends Archivist Meetings: Never  ? Marital Status: Married  ?Intimate Partner Violence: Not At Risk  ? Fear of Current or Ex-Partner: No  ? Emotionally Abused: No  ? Physically Abused: No  ? Sexually Abused: No  ? ? ?Review of Systems:    ?Constitutional: No weight loss, fever or chills ?Skin: No rash ?Cardiovascular: No chest pain ?Respiratory: No SOB  ?Gastrointestinal: See HPI and otherwise negative ?Genitourinary: No dysuria or change in urinary frequency ?Neurological: No headache, dizziness or syncope ?Musculoskeletal: No new muscle or joint pain ?Hematologic:  No bruising ?Psychiatric: No history of depression or anxiety ? ? Physical Exam:  ?Vital signs: ?BP 122/62   Pulse 60   Ht 5' 11.65" (1.82 m) Comment: measured without shoes  Wt 177 lb 6 oz (80.5 kg)   BMI 24.29 kg/m?   ? ?Constitutional:   Pleasant Caucasian male appears to be in NAD, Well developed, Well nourished, alert and cooperative ?Head:  Normocephalic and atraumatic. ?Eyes:   PEERL, EOMI. No icterus. Conjunctiva pink. ?  Ears:  Normal auditory acuity. ?Neck:  Supple ?Throat: Oral cavity and pharynx without inflammation, swelling or lesion.  ?Respiratory: Respirations even and unlabored. Lungs clear to auscultation bilaterally.   No wheezes, crackles, or rhonchi.  ?Cardiovascular: Normal S1, S2. No MRG. Regular rate and rhythm. No peripheral edema, cyanosis or pallor.  ?Gastrointestinal:  Soft, nondistended, nontender. No rebound or guarding. Normal bowel sounds. No appreciable masses or hepatomegaly. ?Rectal: External: 2 hemorrhoid tags; internal: Fullness but no mass, no residue; Anoscopy: Grade 2 internal hemorrhoids visibly irritated ?Msk:  Symmetrical without gross deformities. Without edema, no deformity or joint abnormality.  ?Neurologic:  Alert and  oriented x4;  grossly normal neurologically.  ?Skin:   Dry and intact without significant lesions or rashes. ?Psychiatric: Demonstrates good judgement and reason without abnormal affect or behaviors. ? ?RELEVANT LABS AND IMAGING: ?CBC ?   ?Component Value Date/Time  ? WBC 7.7 11/09/2021 1036  ? RBC 4.83 11/09/2021 1036  ? HGB 15.2 11/09/2021 1036  ? HGB 15.8 09/19/2021 1104  ? HCT 45.6 11/09/2021 1036  ? HCT 46.8 09/19/2021 1104  ? PLT 268.0 11/09/2021 1036  ? MCV 94.4 11/09/2021 1036  ? MCV 94 09/19/2021 1104  ? MCH 31.7 09/19/2021 1104  ? MCH 31.6 09/05/2021 1205  ? MCHC 33.3 11/09/2021 1036  ? RDW 13.2 11/09/2021 1036  ? RDW 12.6 09/19/2021 1104  ? LYMPHSABS 1.4 09/19/2021 1104  ? MONOABS 0.6 09/05/2021 1205  ? EOSABS 0.1 09/19/2021 1104  ? BASOSABS  0.0 09/19/2021 1104  ? ? ?CMP  ?   ?Component Value Date/Time  ? NA 139 01/02/2022 1119  ? NA 141 09/19/2021 1104  ? K 3.8 01/02/2022 1119  ? CL 102 01/02/2022 1119  ? CO2 32 01/02/2022 1119  ? GLUCOSE 9

## 2022-04-02 NOTE — Patient Instructions (Signed)
We have sent the following medications to your pharmacy for you to pick up at your convenience: ?Hydrocortisone 2.5 % cream - Cover a Preparation H suppository with Hydrocortisone cream two times daily for 7 days. ? ?If you are age 75 or older, your body mass index should be between 23-30. Your Body mass index is 24.29 kg/m?Marland Kitchen If this is out of the aforementioned range listed, please consider follow up with your Primary Care Provider. ? ?If you are age 10 or younger, your body mass index should be between 19-25. Your Body mass index is 24.29 kg/m?Marland Kitchen If this is out of the aformentioned range listed, please consider follow up with your Primary Care Provider.  ? ?________________________________________________________ ? ?The [place] [facility] providers would like to encourage you to use [facility] to communicate with providers for non-urgent requests or questions.  Due to long hold times on the telephone, sending your provider a message by [facility] may be a faster and more efficient way to get a response.  Please allow 48 business hours for a response.  Please remember that this is for non-urgent requests.  ?_______________________________________________________ ? ? ?

## 2022-04-03 NOTE — Progress Notes (Signed)
?[facility] ?[address] ?[place]. [name] [address] ?[phone] ? ?     STROKE FOLLOW UP NOTE ? ?[name] ?Date of Birth:  04-11-1946 ?Medical Record Number:  [id number]  ? ?Reason for Referral: stroke follow up ? ? ? ?SUBJECTIVE: ? ? ?CHIEF COMPLAINT:  ?Chief Complaint  ?Patient presents with  ? Follow-up  ?  RM 3 with spouse sharon  ?Pt is well and stable, no new concerns   ? ? ?HPI:  ? ? ?Update 04/03/2022 JM: Patient returns for 74-monthstroke follow-up accompanied by his wife.  Overall stable without new or reoccurring stroke/TIA symptoms.  Compliant on Plavix and atorvastatin, denies side effects.  Blood pressure today 139/67.  Routinely monitors at home and typically stable.  Routinely followed by PCP.  Continued tobacco use approx 1 PPD - does not have a desire to quit at this time.  No new concerns at this time. ? ? ? ? ?History provided for reference purposes only ?Initial visit 09/26/2021 JM: Patient being seen for hospital follow up accompanied by his wife.  Overall stable from stroke standpoint without new or reoccurring stroke/TIA symptoms. ? ?Has been having issues with lip swelling - was seen in ED and concern of allergic reaction possibly to losartan. This was discontinued but continued to have lip swelling as well as issues with hives. He has been seen by allergy specialist last week who did lab work currently still pending and was placed on Zyrtec and Pepcid - he continued for 4 days but had to stop taking due to elevated BP with dizziness and drowsiness.  He has been off Zyrtec and Pepcid since 10/14 and has not experienced any additional falls or lip swelling - did have light upper lip sensation yesterday but did not progress to swelling. There was some concern of possible allergic reaction to Plavix.  ? ?He otherwise is tolerating Plavix without side effects of bleeding or bruising.  Tolerating atorvastatin 40 mg daily without side effects.  Blood pressure today  132/64.  Routinely monitors at home and typically 130-140s/70s.  Continued tobacco use but has decreased daily amount with goals of complete cessation. ? ?No further concerns at this time ? ? ?Stroke admission 08/20/2021 ?JCHISTOPHER MANGINOis a 76y.o. 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  MR brain unremarkable.  EF 65%.  LDL 87.  A1c 5.9.  Recommended DAPT for 3 weeks then Plavix alone as well as increase home dose atorvastatin to 40 mg daily.  PT/OT no therapy needs ? ? ? ? ? ? ?ROS:   ?14 system review of systems performed and negative with exception of those listed in HPI ? ?PMH:  ?Past Medical History:  ?Diagnosis Date  ? Arthritis   ? 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 Hyperlipidemia   ? Hyperplastic colon polyp   ? Hypertension   ? Stroke ([facility] 08/2021  ? ? ?PSH:  ?Past Surgical History:  ?Procedure Laterality Date  ? adominal hernia  12/10/1988  ? INGUINAL HERNIA REPAIR Left 12/10/1997  ? KNEE ARTHROSCOPY  12/10/1988  ? right  ? KNEE ARTHROSCOPY  12/10/2008  ? left  ? PILONIDAL CYST EXCISION  12/10/1978  ? spine  ? ? ?Social History:  ?Social History  ? ?Socioeconomic History  ? Marital status: Married  ?  Spouse name: Not on file  ? Number of children: 2  ? Years of education: Not on file  ? Highest education level: Not on file  ?Occupational History  ? Occupation: Insurance account manager, part-time/ retired  ? Occupation: Tourist information centre manager firm-retired  ?Tobacco Use  ? Smoking status: Every Day  ?  Packs/day: 1.00  ?  Years: 50.00  ?  Pack years: 50.00  ?  Types: Cigarettes  ?  Start date: 12/10/1964  ? Smokeless tobacco: Never  ?Vaping Use  ? Vaping Use: Never used   ?Substance and Sexual Activity  ? Alcohol use: Yes  ?  Alcohol/week: 3.0 standard drinks  ?  Types: 3 Cans of beer per week  ?  Comment: drinks beer; 3 or 4 a week   ? Drug use: No  ? Sexual activity: Yes  ?Other Topics Concern  ? Not on file  ?Social History Narrative  ? Not on file  ? ?Social Determinants of Health  ? ?Financial Resource Strain: Low Risk   ? Difficulty of Paying Living Expenses: Not hard at all  ?Food Insecurity: No Food Insecurity  ? Worried About Charity fundraiser in the Last Year: Never true  ? Ran Out of Food in the Last Year: Never true  ?Transportation Needs: No Transportation Needs  ? Lack of Transportation (Medical): No  ? Lack of Transportation (Non-Medical): No  ?Physical Activity: Insufficiently Active  ? Days of Exercise per Week: 3 days  ? Minutes of Exercise per Session: 30 min  ?Stress: No Stress Concern Present  ? Feeling of Stress : Not at all  ?Social Connections: Moderately Integrated  ? Frequency of Communication with Friends and Family: Twice a week  ? Frequency of Social Gatherings with Friends and Family: Twice a week  ? Attends Religious Services: More than 4 times per year  ? Active Member of Clubs or Organizations: No  ? Attends Archivist Meetings: Never  ? Marital Status: Married  ?Intimate Partner Violence: Not At Risk  ? Fear of Current or Ex-Partner: No  ? Emotionally Abused: No  ? Physically Abused: No  ? Sexually Abused: No  ? ? ?Family History:  ?Family History  ?Problem Relation Age of Onset  ? Stroke Mother   ? Heart disease Mother   ? Stroke Father   ? Heart disease Father   ? Cancer Sister   ?     type unknown  ? Heart disease Maternal Grandmother   ? Heart attack Maternal Grandfather   ? Colon cancer Paternal Grandmother   ? Stroke Paternal Grandfather   ? Stomach cancer Neg Hx   ? ? ?Medications:   ?Current Outpatient Medications on File Prior to Visit  ?Medication Sig Dispense Refill  ? acetaminophen (TYLENOL) 500 MG tablet Take 1,000 mg by  mouth every 6 (six) hours as needed for mild pain.    ? amLODipine (NORVASC) 10 MG tablet TAKE 1 TABLET BY MOUTH  DAILY 90 tablet 3  ? atorvastatin (LIPITOR) 40 MG tablet Take 1 tablet (40 mg total) by mouth daily. 90 tablet 3  ? cholecalciferol (VITAMIN D) 1000 units tablet Take 1 tablet (1,000 Units total) by mouth 2 (two) times daily. Takes 2 tablets twice daily (Patient taking differently: Take 2,000 Units by mouth daily.) 180 tablet 2  ? clopidogrel (PLAVIX) 75 MG tablet Take  1 tablet (75 mg total) by mouth daily. 90 tablet 3  ? hydrocortisone (ANUSOL-HC) 2.5 % rectal cream Place 1 application. rectally 2 (two) times daily. 30 g 1  ? Menthol-Methyl Salicylate (SALONPAS PAIN RELIEF PATCH EX) Apply 1 patch topically daily as needed (knee pain).    ? metoprolol succinate (TOPROL-XL) 50 MG 24 hr tablet Take 1 tablet (50 mg total) by mouth daily. Take with or immediately following a meal. 90 tablet 3  ? Multiple Vitamin (MULTIVITAMIN) tablet Take 1 tablet by mouth daily.    ? Omega-3 Fatty Acids (FISH OIL) 1200 MG CAPS Take 1 capsule (1,200 mg total) by mouth daily. Take 1 capsule daily (Patient taking differently: Take 1,000 mg by mouth daily.) 90 capsule 3  ? triamterene-hydrochlorothiazide (MAXZIDE-25) 37.5-25 MG tablet Take 1 tablet by mouth daily. 90 tablet 3  ? Turmeric 500 MG TABS Take 500 mg by mouth. Take twice per day    ? ?No current facility-administered medications on file prior to visit.  ? ? ?Allergies:   ?Allergies  ?Allergen Reactions  ? Losartan Swelling  ? ? ? ? ?OBJECTIVE: ? ?Physical Exam ? ?Vitals:  ? 04/04/22 1044  ?BP: 139/67  ?Pulse: 65  ?Weight: 178 lb (80.7 kg)  ?Height: 6' (1.829 m)  ? ?Body mass index is 24.14 kg/m?Marland Kitchen ?No results found. ? ? ?General: well developed, well nourished, very pleasant elderly Caucasian male, seated, in no evident distress ?Head: head normocephalic and atraumatic.   ?Neck: supple with no carotid or supraclavicular bruits ?Cardiovascular: regular rate and rhythm,  no murmurs ?Musculoskeletal: no deformity ?Skin:  no rash/petichiae ?Vascular:  Normal pulses all extremities ?  ?Neurologic Exam ?Mental Status: Awake and fully alert.  Fluent speech and language.  Edward Thornton

## 2022-04-04 ENCOUNTER — Ambulatory Visit: Payer: Medicare Other | Admitting: Physician Assistant

## 2022-04-04 ENCOUNTER — Encounter: Payer: Self-pay | Admitting: Adult Health

## 2022-04-04 ENCOUNTER — Ambulatory Visit: Payer: Medicare Other | Admitting: Adult Health

## 2022-04-04 VITALS — BP 139/67 | HR 65 | Ht 72.0 in | Wt 178.0 lb

## 2022-04-04 DIAGNOSIS — Z720 Tobacco use: Secondary | ICD-10-CM | POA: Diagnosis not present

## 2022-04-04 DIAGNOSIS — G459 Transient cerebral ischemic attack, unspecified: Secondary | ICD-10-CM | POA: Diagnosis not present

## 2022-04-04 NOTE — Patient Instructions (Addendum)
Continue clopidogrel 75 mg daily  and atorvastatin for secondary stroke prevention ? ?Continue to follow up with PCP regarding cholesterol and blood pressure management  ?Maintain strict control of hypertension with blood pressure goal below 130/90 and cholesterol with LDL cholesterol (bad cholesterol) goal below 70 mg/dL.  ? ?Signs of a Stroke? Follow the BEFAST method:  ?Balance Watch for a sudden loss of balance, trouble with coordination or vertigo ?Eyes Is there a sudden loss of vision in one or both eyes? Or double vision?  ?Face: Ask the person to smile. Does one side of the face droop or is it numb?  ?Arms: Ask the person to raise both arms. Does one arm drift downward? Is there weakness or numbness of a leg? ?Speech: Ask the person to repeat a simple phrase. Does the speech sound slurred/strange? Is the person confused ? ?Time: If you observe any of these signs, call 911. ? ?Highly encourage complete tobacco cessation as continued use greatly increases risk of additional strokes ? ? ? ? ? ? ?Thank you for coming to see [place] at [facility]. I hope we have been able to provide you high quality care today. ? ?You may receive a patient satisfaction survey over the next few weeks. We would appreciate your feedback and comments so that we may continue to improve ourselves and the health of our patients. ? ? ?Stroke Prevention ?Some medical conditions and behaviors can lead to a higher chance of having a stroke. You can help prevent a stroke by eating healthy, exercising, not smoking, and managing any medical conditions you have. ?Stroke is a leading cause of functional impairment. Primary prevention is particularly important because a majority of strokes are first-time events. Stroke changes the lives of not only those who experience a stroke but also their family and other caregivers. ?How can this condition affect me? ?A stroke is a medical emergency and should be treated right away. A stroke  can lead to brain damage and can sometimes be life-threatening. If a person gets medical treatment right away, there is a better chance of surviving and recovering from a stroke. ?What can increase my risk? ?The following medical conditions may increase your risk of a stroke: ?Cardiovascular disease. ?High blood pressure (hypertension). ?Diabetes. ?High cholesterol. ?Sickle cell disease. ?Blood clotting disorders (hypercoagulable state). ?Obesity. ?Sleep disorders (obstructive sleep apnea). ?Other risk factors include: ?Being older than age 72. ?Having a history of blood clots, stroke, or mini-stroke (transient ischemic attack, TIA). ?Genetic factors, such as race, ethnicity, or a family history of stroke. ?Smoking cigarettes or using other tobacco products. ?Taking birth control pills, especially if you also use tobacco. ?Heavy use of alcohol or drugs, especially cocaine and methamphetamine. ?Physical inactivity. ?What actions can I take to prevent this? ?Manage your health conditions ?High cholesterol levels. ?Eating a healthy diet is important for preventing high cholesterol. If cholesterol cannot be managed through diet alone, you may need to take medicines. ?Take any prescribed medicines to control your cholesterol as told by your health care provider. ?Hypertension. ?To reduce your risk of stroke, try to keep your blood pressure below 130/80. ?Eating a healthy diet and exercising regularly are important for controlling blood pressure. If these steps are not enough to manage your blood pressure, you may need to take medicines. ?Take any prescribed medicines to control hypertension as told by your health care provider. ?Ask your health care provider if you should monitor your blood pressure at home. ?Have your blood pressure checked every  year, even if your blood pressure is normal. Blood pressure increases with age and some medical conditions. ?Diabetes. ?Eating a healthy diet and exercising regularly are  important parts of managing your blood sugar (glucose). If your blood sugar cannot be managed through diet and exercise, you may need to take medicines. ?Take any prescribed medicines to control your diabetes as told by your health care provider. ?Get evaluated for obstructive sleep apnea. Talk to your health care provider about getting a sleep evaluation if you snore a lot or have excessive sleepiness. ?Make sure that any other medical conditions you have, such as atrial fibrillation or atherosclerosis, are managed. ?Nutrition ?Follow instructions from your health care provider about what to eat or drink to help manage your health condition. These instructions may include: ?Reducing your daily calorie intake. ?Limiting how much salt (sodium) you use to 1,500 milligrams (mg) each day. ?Using only healthy fats for cooking, such as olive oil, canola oil, or sunflower oil. ?Eating healthy foods. You can do this by: ?Choosing foods that are high in fiber, such as whole grains, and fresh fruits and vegetables. ?Eating at least 5 servings of fruits and vegetables a day. Try to fill one-half of your plate with fruits and vegetables at each meal. ?Choosing lean protein foods, such as lean cuts of meat, poultry without skin, fish, tofu, beans, and nuts. ?Eating low-fat dairy products. ?Avoiding foods that are high in sodium. This can help lower blood pressure. ?Avoiding foods that have saturated fat, trans fat, and cholesterol. This can help prevent high cholesterol. ?Avoiding processed and prepared foods. ?Counting your daily carbohydrate intake. ? ?Lifestyle ?If you drink alcohol: ?Limit how much you have to: ?0-1 drink a day for women who are not pregnant. ?0-2 drinks a day for men. ?Know how much alcohol is in your drink. In the [place] one drink equals one 12 oz bottle of beer (348m), one 5 oz glass of wine (1484m, or one 1? oz glass of hard liquor (4456m ?Do not use any products that contain nicotine or tobacco. These  products include cigarettes, chewing tobacco, and vaping devices, such as e-cigarettes. If you need help quitting, ask your health care provider. ?Avoid secondhand smoke. ?Do not use drugs. ?Activity ? ?Try to stay at a healthy weight. ?Get at least 30 minutes of exercise on most days, such as: ?Fast walking. ?Biking. ?Swimming. ?Medicines ?Take over-the-counter and prescription medicines only as told by your health care provider. Aspirin or blood thinners (antiplatelets or anticoagulants) may be recommended to reduce your risk of forming blood clots that can lead to stroke. ?Avoid taking birth control pills. Talk to your health care provider about the risks of taking birth control pills if: ?You are over 35 81ars old. ?You smoke. ?You get very bad headaches. ?You have had a blood clot. ?Where to find more information ?American Stroke Association: www.strokeassociation.org ?Get help right away if: ?You or a loved one has any symptoms of a stroke. "BE FAST" is an easy way to remember the main warning signs of a stroke: ?B - Balance. Signs are dizziness, sudden trouble walking, or loss of balance. ?E - Eyes. Signs are trouble seeing or a sudden change in vision. ?F - Face. Signs are sudden weakness or numbness of the face, or the face or eyelid drooping on one side. ?A - Arms. Signs are weakness or numbness in an arm. This happens suddenly and usually on one side of the body. ?S - Speech. Signs are sudden trouble speaking,  slurred speech, or trouble understanding what people say. ?T - Time. Time to call emergency services. Write down what time symptoms started. ?You or a loved one has other signs of a stroke, such as: ?A sudden, severe headache with no known cause. ?Nausea or vomiting. ?Seizure. ?These symptoms may represent a serious problem that is an emergency. Do not wait to see if the symptoms will go away. Get medical help right away. Call your local emergency services (911 in the [place] Do not drive yourself to  the hospital. ?Summary ?You can help to prevent a stroke by eating healthy, exercising, not smoking, limiting alcohol intake, and managing any medical conditions you may have. ?Do not use any products tha

## 2022-04-08 DIAGNOSIS — F17210 Nicotine dependence, cigarettes, uncomplicated: Secondary | ICD-10-CM

## 2022-04-08 DIAGNOSIS — E785 Hyperlipidemia, unspecified: Secondary | ICD-10-CM

## 2022-04-08 DIAGNOSIS — I10 Essential (primary) hypertension: Secondary | ICD-10-CM

## 2022-04-08 DIAGNOSIS — M1990 Unspecified osteoarthritis, unspecified site: Secondary | ICD-10-CM

## 2022-05-08 ENCOUNTER — Telehealth: Payer: Self-pay

## 2022-05-08 ENCOUNTER — Telehealth: Payer: Self-pay | Admitting: Gastroenterology

## 2022-05-08 DIAGNOSIS — K625 Hemorrhage of anus and rectum: Secondary | ICD-10-CM

## 2022-05-08 DIAGNOSIS — Z7901 Long term (current) use of anticoagulants: Secondary | ICD-10-CM

## 2022-05-08 DIAGNOSIS — K641 Second degree hemorrhoids: Secondary | ICD-10-CM

## 2022-05-08 NOTE — Telephone Encounter (Signed)
Returned call to patient and his wife. Pt states that he has been doing fine since his last office visit until this morning. Pt reports that he had a formed BM this morning and noticed BRB with wiping. He states that the blood was dripping in the commode and colored the commode. Pt denies any constipation, straining, or excessive diarrhea. Pt states that his BMs are pretty regular and he has about 1 BM daily. Pt states that after his bleeding this morning, he used the Hydrocortisone ointment on the Preparation H suppository as previously recommended. Pt states that this helps and the bleeding has since stopped. He is still taking Plavix. Denies any SOB, dizziness, or fatigue. Pt denies any rectal pain. He is not taking any stool softeners. Pt was supposed to go out of town today, but they are giving him a day to rest. Pt's wife states that the pt's health is their 1st priority, but they wondered if they could go out of town tomorrow, they would return in 1 week. If a colonoscopy is recommended they want pt to be scheduled with Dr. Ardis Hughs, I informed the pt that is his primary GI provider and that's who he would be scheduled with. I also told pt that he may need a hem band (if he is a candidate) or possible surgical referral depending on results of colonoscopy if one is needed. I told pt and his wife that I would see what you recommend as next steps.

## 2022-05-08 NOTE — Telephone Encounter (Signed)
Good Morning,  Patients wife called this morning states patient was suppose to go out of town this morning but patient is having rectal bleeding that is hard to maintain. Patient wife states they would like to speak with a nurse as soon as possible. Please give patient a call back to advise.  Thank You.

## 2022-05-08 NOTE — Telephone Encounter (Signed)
Cardiac clearance letter faxed to Dr. Leonie Man at [facility].

## 2022-05-08 NOTE — Telephone Encounter (Signed)
Called and spoke with patient and his wife regarding Jennifer's recommendations. They will see how patient is doing in the morning. If he has bleeding in the am they will go by the lab before they go out of town. Pt has been advised to continue Hydrocortisone cream on Preparation H suppositories BID until his symptoms improve. Pt is aware that he can repeat this regimen as needed. Pt has been scheduled for an in-person pre-visit on Friday, 06/01/22 at 1 pm. Pt knows to check in on the 2nd floor. Pt is scheduled for a colonoscopy in the [facility] with Dr. Ardis Hughs on Friday, 06/15/22 at 11:30 am. Pt is aware that he will need to arrive at 10:30 am with a care partner. Pt states that Dr. Leonie Man prescribes his Plavix. Pt is aware that we are going to request clearance to hold Plavix 5 days prior to his colonoscopy. Pt knows that he will be instructed when to hold Plavix at the time of his pre-visit appointment. Pt and his wife were very thankful for my help. They verbalized understanding and had no concerns at the end of the call.  Please see alternate telephone encounter for cardiac clearance.   Lab order in epic.

## 2022-05-08 NOTE — Telephone Encounter (Signed)
[place] Feb 07, 1946 [id number]  Dear Althea Grimmer P. Leonie Man, MD:  We have scheduled the above named patient for a colonoscopy procedure. Our records show that he is on anticoagulation therapy.  Please advise as to whether the patient may come off their therapy of Plavix 5 days prior to their procedure which is scheduled for Friday, 06/15/22.  Please fax response to Gerre Couch, RN at  [phone].  Sincerely,    [facility]

## 2022-05-08 NOTE — Telephone Encounter (Signed)
Lets go ahead and get him scheduled for his colonoscopy.  It still sounds like mostly the hemorrhoids giving him issues and I would recommend he continue the Hydrocortisone on Preparation H suppositories twice daily until symptoms get better.  He will need to be off his Plavix for 5 days, we will need to communicate with his prescribed physician to ensure this is acceptable for him.  Lets go ahead and get him on Dr. Edison Nasuti schedule.  It is probably fine for them to go out of town.  We can check a CBC if that would make them feel better.  Certainly if the bleeding does not stop or he begins to feel lightheaded or has any other symptoms then that would be a sign that things have worsened and he needs to go to the ER.   Thanks, JLL

## 2022-05-09 NOTE — Telephone Encounter (Signed)
Letter received. Will give a copy to pre-visit nurse.

## 2022-05-09 NOTE — Telephone Encounter (Signed)
NP has signed clearnace letter and I have faxed to # [phone] attention Gerre Couch, RN

## 2022-05-09 NOTE — Telephone Encounter (Signed)
Received clearance letter. Placed on NP's desk for review.

## 2022-05-09 NOTE — Telephone Encounter (Signed)
Patient's stroke occurred in 08/2021 and has been doing well since that time.  He is cleared to hold Plavix for 5 days prior to colonoscopy with small but acceptable risk of preprocedural stroke while off therapy.  Recommend restarting Plavix immediately after or once hemodynamically stable.

## 2022-05-09 NOTE — Telephone Encounter (Signed)
Letter has been formulated and given to NP for signature.

## 2022-06-01 ENCOUNTER — Ambulatory Visit (AMBULATORY_SURGERY_CENTER): Payer: Self-pay

## 2022-06-01 VITALS — Ht 72.0 in | Wt 179.0 lb

## 2022-06-01 DIAGNOSIS — Z1211 Encounter for screening for malignant neoplasm of colon: Secondary | ICD-10-CM

## 2022-06-01 DIAGNOSIS — K625 Hemorrhage of anus and rectum: Secondary | ICD-10-CM

## 2022-06-01 MED ORDER — NA SULFATE-K SULFATE-MG SULF 17.5-3.13-1.6 GM/177ML PO SOLN: 1.0000 | Freq: Once | ORAL | 0 refills | Status: AC

## 2022-06-05 ENCOUNTER — Encounter: Payer: Self-pay | Admitting: Gastroenterology

## 2022-06-15 ENCOUNTER — Encounter: Payer: Medicare Other | Admitting: Gastroenterology

## 2022-06-25 IMAGING — CT CT CHEST LUNG CANCER SCREENING LOW DOSE W/O CM
2 of 4 series · 15 of 36 positions shown, 18 images · non-contrast
Comparison: 11/14/2020

CLINICAL DATA: Fifty-two pack-year smoking history. Current smoker.

EXAM:
CT CHEST WITHOUT CONTRAST LOW-DOSE FOR LUNG CANCER SCREENING
TECHNIQUE: Multidetector CT imaging of the chest was performed following the
standard protocol without IV contrast.

[Series 3: lung thins 1.0 · axial · 0.78mm/px · z∈[-375,-42]mm · 12 of 367 slices shown, 15 images]
[im 17/367  mediastinal]
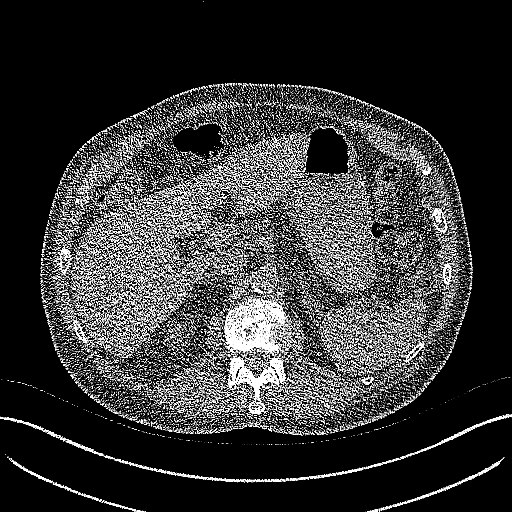
[im 17/367  lung]
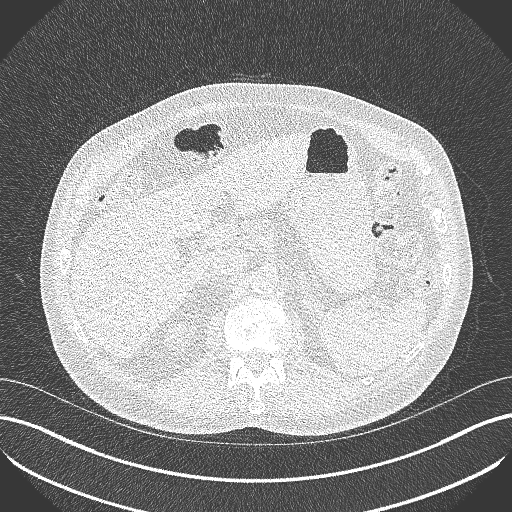
[im 50/367  lung]
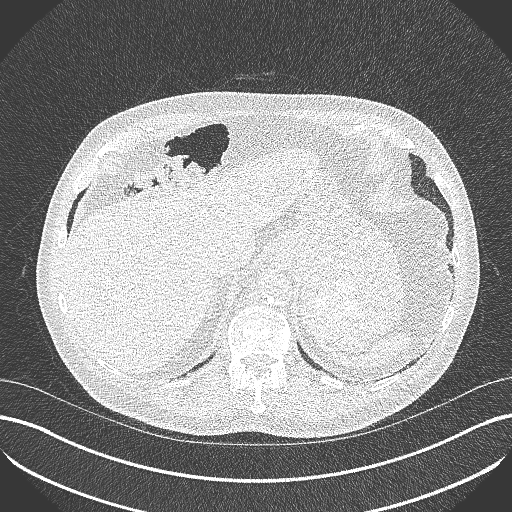
[im 84/367  lung]
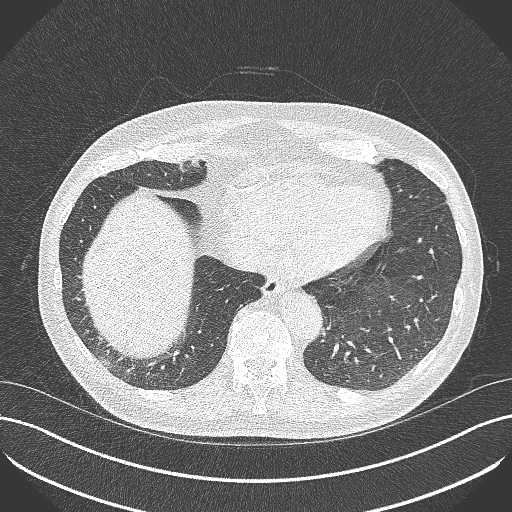
[im 117/367  lung]
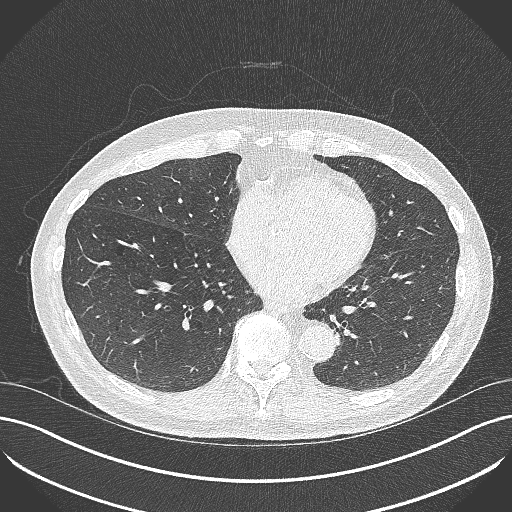
[im 134/367  mediastinal]
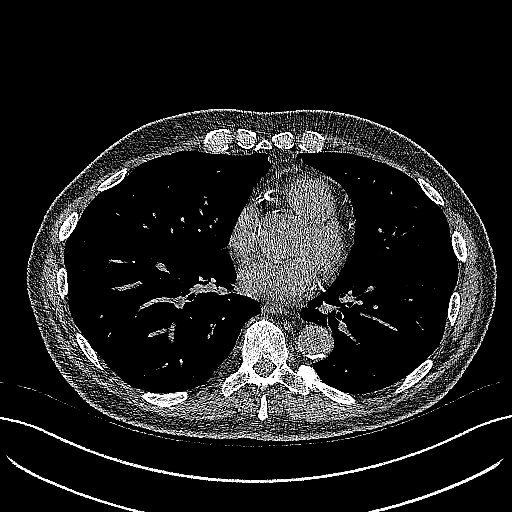
[im 134/367  lung]
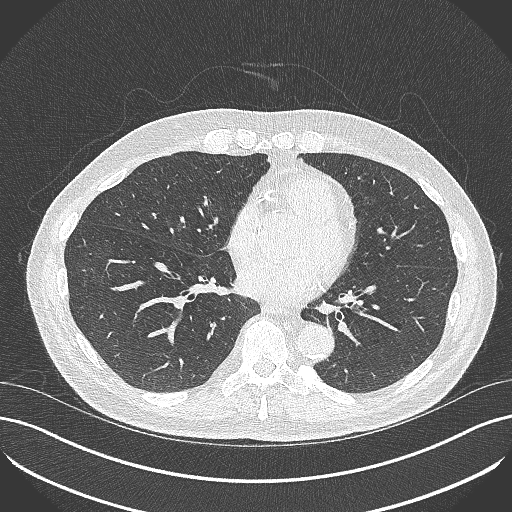
[im 167/367  lung]
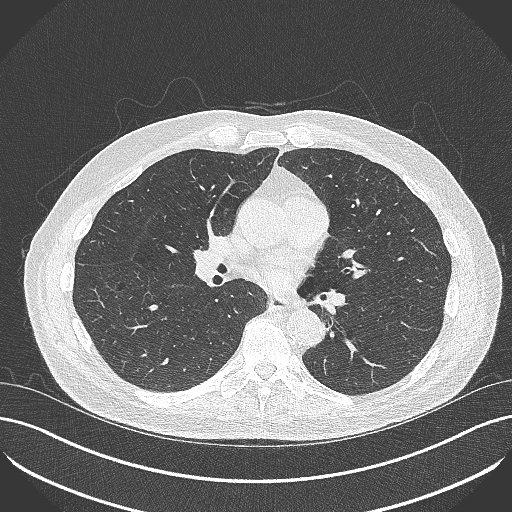
[im 200/367  lung]
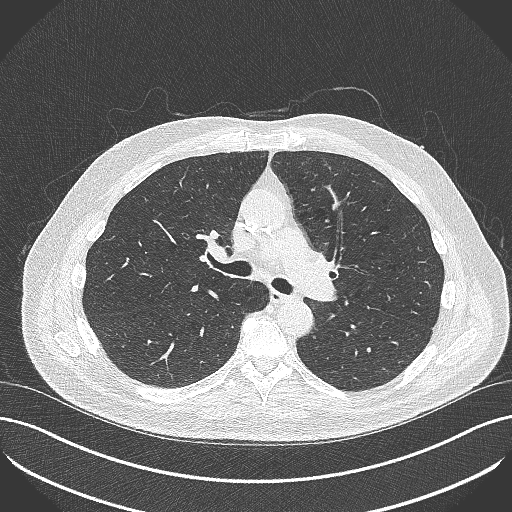
[im 233/367  lung]
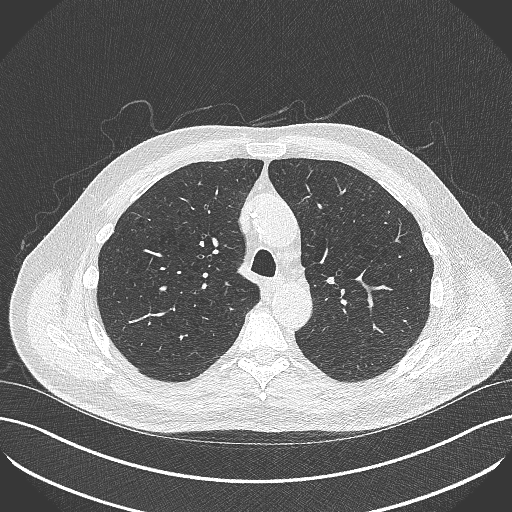
[im 250/367  mediastinal]
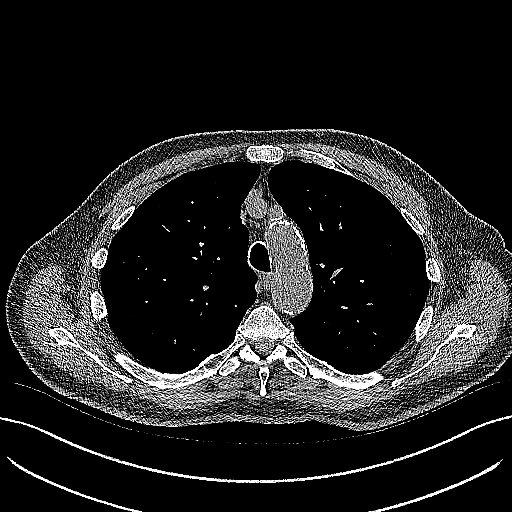
[im 250/367  lung]
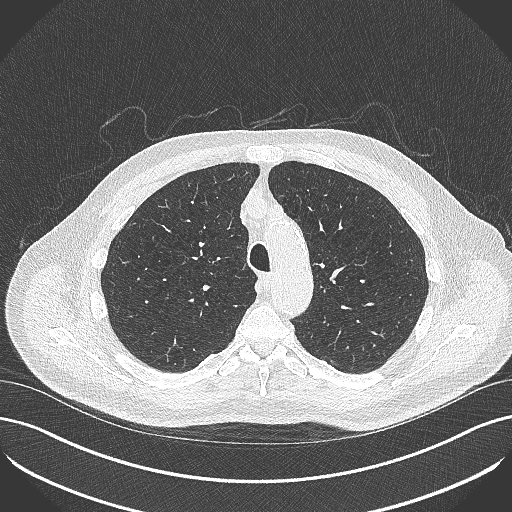
[im 283/367  lung]
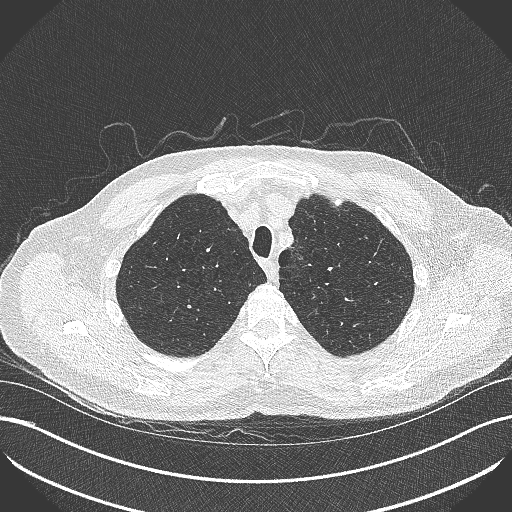
[im 317/367  lung]
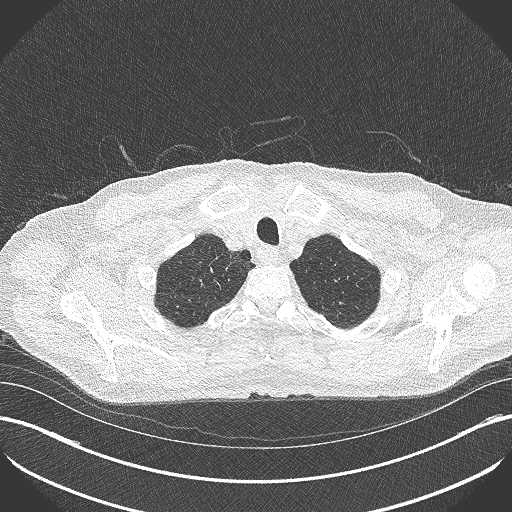
[im 350/367  lung]
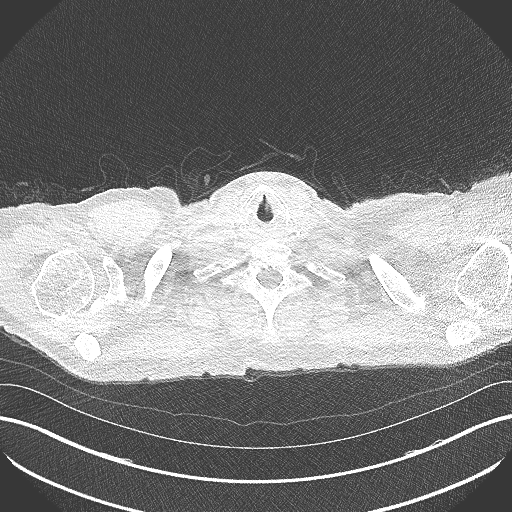

[Series 5: coronal · coronal · 0.70mm/px · 3 of 129 slices shown]
[im 26/129  lung]
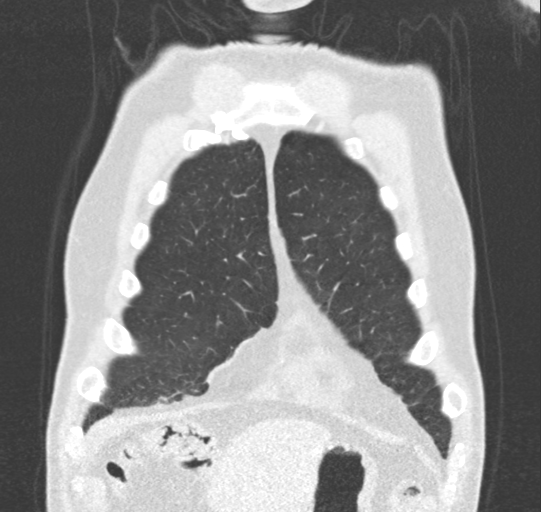
[im 52/129  lung]
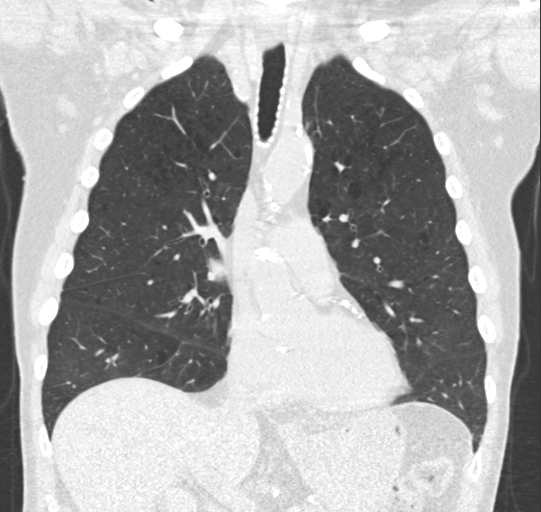
[im 77/129  lung]
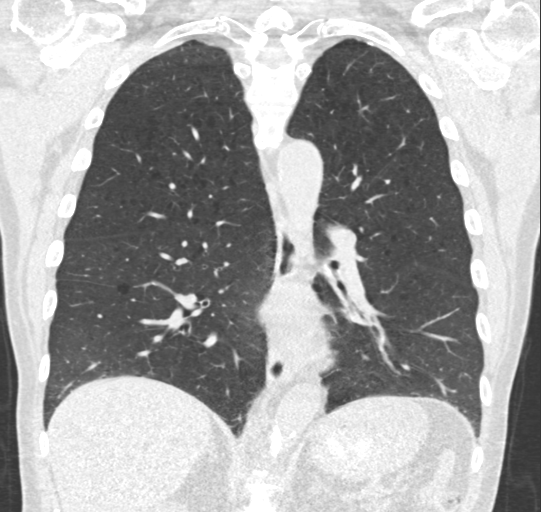

[15 of 36 positions shown; findings below may reference images not displayed]

FINDINGS: Cardiovascular: Aortic atherosclerosis. Normal heart size, without
pericardial effusion. Left main and 3 vessel coronary artery
calcification.

Mediastinum/Nodes: No mediastinal or definite hilar adenopathy,
given limitations of unenhanced CT.

Lungs/Pleura: No pleural fluid. Mild centrilobular and paraseptal
emphysema. No suspicious pulmonary nodule or mass.

Upper Abdomen: Lateral segment left liver lobe subcentimeter cyst.
Normal imaged portions of the spleen, stomach, pancreas, adrenal
glands, right kidney.

Musculoskeletal: No acute osseous abnormality.
IMPRESSION: 1. Lung-RADS 1, negative. Continue annual screening with low-dose
chest CT without contrast in 12 months.
2. Aortic Atherosclerosis (S04OS-0KE.E) and Emphysema (S04OS-JZJ.R).
Coronary artery atherosclerosis.

## 2022-07-10 DIAGNOSIS — H52203 Unspecified astigmatism, bilateral: Secondary | ICD-10-CM | POA: Diagnosis not present

## 2022-07-10 DIAGNOSIS — H353111 Nonexudative age-related macular degeneration, right eye, early dry stage: Secondary | ICD-10-CM | POA: Diagnosis not present

## 2022-07-10 DIAGNOSIS — Z961 Presence of intraocular lens: Secondary | ICD-10-CM | POA: Diagnosis not present

## 2022-07-18 ENCOUNTER — Telehealth: Payer: Self-pay | Admitting: *Deleted

## 2022-07-18 NOTE — Telephone Encounter (Signed)
Pt rescheduled his appointment to 08-14-22 at 11:00.  New instructions mailed to pt

## 2022-08-02 ENCOUNTER — Encounter: Payer: Self-pay | Admitting: Gastroenterology

## 2022-08-08 ENCOUNTER — Encounter: Payer: Self-pay | Admitting: Certified Registered Nurse Anesthetist

## 2022-08-08 ENCOUNTER — Encounter: Payer: Medicare Other | Admitting: Gastroenterology

## 2022-08-14 ENCOUNTER — Ambulatory Visit (AMBULATORY_SURGERY_CENTER): Payer: Medicare Other | Admitting: Gastroenterology

## 2022-08-14 ENCOUNTER — Encounter: Payer: Self-pay | Admitting: Gastroenterology

## 2022-08-14 ENCOUNTER — Telehealth: Payer: Self-pay

## 2022-08-14 VITALS — BP 127/61 | HR 54 | Temp 97.3°F | Resp 15 | Ht 72.0 in | Wt 179.0 lb

## 2022-08-14 DIAGNOSIS — D123 Benign neoplasm of transverse colon: Secondary | ICD-10-CM | POA: Diagnosis not present

## 2022-08-14 DIAGNOSIS — D125 Benign neoplasm of sigmoid colon: Secondary | ICD-10-CM

## 2022-08-14 DIAGNOSIS — K625 Hemorrhage of anus and rectum: Secondary | ICD-10-CM | POA: Diagnosis not present

## 2022-08-14 DIAGNOSIS — D120 Benign neoplasm of cecum: Secondary | ICD-10-CM | POA: Diagnosis not present

## 2022-08-14 DIAGNOSIS — K648 Other hemorrhoids: Secondary | ICD-10-CM

## 2022-08-14 DIAGNOSIS — D128 Benign neoplasm of rectum: Secondary | ICD-10-CM

## 2022-08-14 DIAGNOSIS — D127 Benign neoplasm of rectosigmoid junction: Secondary | ICD-10-CM

## 2022-08-14 DIAGNOSIS — K5520 Angiodysplasia of colon without hemorrhage: Secondary | ICD-10-CM | POA: Diagnosis not present

## 2022-08-14 DIAGNOSIS — Z1211 Encounter for screening for malignant neoplasm of colon: Secondary | ICD-10-CM

## 2022-08-14 MED ORDER — SODIUM CHLORIDE 0.9 % IV SOLN: 500.0000 mL | Freq: Once | INTRAVENOUS | Status: DC

## 2022-08-14 NOTE — Patient Instructions (Signed)
RESUME PLAVIX TOMORROW ( 08/15/22)  Handouts on polyps,diverticulosis,& hemorrhoids given to you today.   Await pathology results on polyps removed    Daily fiber supplement recommended such as Citrucel & can use Calmol4 suppositories(over the counter) as needed for hemorrhoids   Follow up in office for hemorrhoid therapy if symptoms persist - Handout on banding of hemorrhoids given to you today    YOU HAD AN ENDOSCOPIC PROCEDURE TODAY AT [facility]:   Refer to the procedure report that was given to you for any specific questions about what was found during the examination.  If the procedure report does not answer your questions, please call your gastroenterologist to clarify.  If you requested that your care partner not be given the details of your procedure findings, then the procedure report has been included in a sealed envelope for you to review at your convenience later.  YOU SHOULD EXPECT: Some feelings of bloating in the abdomen. Passage of more gas than usual.  Walking can help get rid of the air that was put into your GI tract during the procedure and reduce the bloating. If you had a lower endoscopy (such as a colonoscopy or flexible sigmoidoscopy) you may notice spotting of blood in your stool or on the toilet paper. If you underwent a bowel prep for your procedure, you may not have a normal bowel movement for a few days.  Please Note:  You might notice some irritation and congestion in your nose or some drainage.  This is from the oxygen used during your procedure.  There is no need for concern and it should clear up in a day or so.  SYMPTOMS TO REPORT IMMEDIATELY:  Following lower endoscopy (colonoscopy or flexible sigmoidoscopy):  Excessive amounts of blood in the stool  Significant tenderness or worsening of abdominal pains  Swelling of the abdomen that is new, acute  Fever of 100F or higher   For urgent or emergent issues, a gastroenterologist can be  reached at any hour by calling [phone]. Do not use MyChart messaging for urgent concerns.    DIET:  We do recommend a small meal at first, but then you may proceed to your regular diet.  Drink plenty of fluids but you should avoid alcoholic beverages for 24 hours.  ACTIVITY:  You should plan to take it easy for the rest of today and you should NOT DRIVE or use heavy machinery until tomorrow (because of the sedation medicines used during the test).    FOLLOW UP: Our staff will call the number listed on your records the next business day following your procedure.  We will call around 7:15- 8:00 am to check on you and address any questions or concerns that you may have regarding the information given to you following your procedure. If we do not reach you, we will leave a message.  If you develop any symptoms (ie: fever, flu-like symptoms, shortness of breath, cough etc.) before then, please call [phone].  If you test positive for Covid 19 in the 2 weeks post procedure, please call and report this information to [place].    If any biopsies were taken you will be contacted by phone or by letter within the next 1-3 weeks.  Please call us at [phone] if you have not heard about the biopsies in 3 weeks.    SIGNATURES/CONFIDENTIALITY: You and/or your care partner have signed paperwork which will be entered into your electronic medical record.  These signatures attest  to the fact that that the information above on your After Visit Summary has been reviewed and is understood.  Full responsibility of the confidentiality of this discharge information lies with you and/or your care-partner.

## 2022-08-14 NOTE — Progress Notes (Signed)
Called to room to assist during endoscopic procedure.  Patient ID and intended procedure confirmed with present staff. Received instructions for my participation in the procedure from the performing physician.  

## 2022-08-14 NOTE — Progress Notes (Signed)
Report given to PACU, vss 

## 2022-08-14 NOTE — Progress Notes (Signed)
VS completed by CW.   Pt's states no medical or surgical changes since previsit or office visit.  

## 2022-08-14 NOTE — Progress Notes (Signed)
[facility] History and Physical   Primary Care Physician:  Hoyt Koch, MD   Reason for Procedure:   Screening  Plan:    colonoscopy     HPI: Edward Thornton is a 76 y.o. male  here for colonoscopy - last exam > 10 years ago. He has intermittent rectal bleeding thought to be due to hemorrhoids. Takes Plavix, held for 5 days. . Patient denies any bowel symptoms at this time. No first degree family history of colon cancer  Otherwise feels well without any cardiopulmonary symptoms.   I have discussed risks / benefits of anesthesia and endoscopic procedure with Edward Thornton and they wish to proceed with the exams as outlined today.    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   Cataract    bilateral sx   Emphysema of lung ([place])    smoker- pt told per scans (emphysema is seen on the scans)   Hyperlipidemia    on meds   Hyperplastic colon polyp    Hypertension    on meds   Seasonal allergies    Stroke [facility]) 08/2021   08/2021- tPA administered to patient    Past Surgical History:  Procedure Laterality Date   CATARACT EXTRACTION, BILATERAL Bilateral    COLONOSCOPY  2013   DJ-prep good-int/ext hems-recall 10 yrs   INGUINAL HERNIA REPAIR Left 12/10/1997   KNEE ARTHROSCOPY Right 12/10/1988   KNEE ARTHROSCOPY Left 12/10/2008   PILONIDAL CYST EXCISION  12/10/1978   spine    Prior to Admission medications   Medication Sig Start Date End Date Taking? Authorizing Provider  amLODipine (NORVASC) 10 MG tablet TAKE 1 TABLET BY MOUTH  DAILY 10/11/21  Yes Hoyt Koch, MD  atorvastatin (LIPITOR) 40 MG tablet Take 1 tablet (40 mg total) by mouth daily. 11/09/21  Yes Hoyt Koch, MD  hydrocortisone (ANUSOL-HC) 2.5 % rectal cream Place 1 application. rectally 2 (two) times daily. 04/02/22  Yes Levin Erp, PA  metoprolol succinate (TOPROL-XL) 50 MG 24 hr tablet Take 1 tablet (50 mg total) by  mouth daily. Take with or immediately following a meal. 01/02/22  Yes Hoyt Koch, MD  Multiple Vitamin (MULTIVITAMIN) tablet Take 1 tablet by mouth daily.   Yes [provider]  Omega-3 Fatty Acids (FISH OIL) 1200 MG CAPS Take 1 capsule (1,200 mg total) by mouth daily. Take 1 capsule daily Patient taking differently: Take 1,000 mg by mouth daily. 01/18/16  Yes Hoyt Koch, MD  triamterene-hydrochlorothiazide (MAXZIDE-25) 37.5-25 MG tablet Take 1 tablet by mouth daily. 11/09/21  Yes Hoyt Koch, MD  Turmeric 500 MG TABS Take 500 mg by mouth. Take twice per day   Yes [provider]  acetaminophen (TYLENOL) 500 MG tablet Take 1,000 mg by mouth every 6 (six) hours as needed for mild pain.    [provider]  clopidogrel (PLAVIX) 75 MG tablet Take 1 tablet (75 mg total) by mouth daily. 11/09/21   Hoyt Koch, MD  Menthol-Methyl Salicylate (SALONPAS PAIN RELIEF PATCH EX) Apply 1 patch topically daily as needed (knee pain).    [provider]    Current Outpatient Medications  Medication Sig Dispense Refill   amLODipine (NORVASC) 10 MG tablet TAKE 1 TABLET BY MOUTH  DAILY 90 tablet 3   atorvastatin (LIPITOR) 40 MG tablet Take 1 tablet (40 mg total) by mouth daily. 90 tablet 3   hydrocortisone (  ANUSOL-HC) 2.5 % rectal cream Place 1 application. rectally 2 (two) times daily. 30 g 1   metoprolol succinate (TOPROL-XL) 50 MG 24 hr tablet Take 1 tablet (50 mg total) by mouth daily. Take with or immediately following a meal. 90 tablet 3   Multiple Vitamin (MULTIVITAMIN) tablet Take 1 tablet by mouth daily.     Omega-3 Fatty Acids (FISH OIL) 1200 MG CAPS Take 1 capsule (1,200 mg total) by mouth daily. Take 1 capsule daily (Patient taking differently: Take 1,000 mg by mouth daily.) 90 capsule 3   triamterene-hydrochlorothiazide (MAXZIDE-25) 37.5-25 MG tablet Take 1 tablet by mouth daily. 90 tablet 3   Turmeric 500 MG TABS Take 500 mg by  mouth. Take twice per day     acetaminophen (TYLENOL) 500 MG tablet Take 1,000 mg by mouth every 6 (six) hours as needed for mild pain.     clopidogrel (PLAVIX) 75 MG tablet Take 1 tablet (75 mg total) by mouth daily. 90 tablet 3   Menthol-Methyl Salicylate (SALONPAS PAIN RELIEF PATCH EX) Apply 1 patch topically daily as needed (knee pain).     Current Facility-Administered Medications  Medication Dose Route Frequency Provider Last Rate Last Admin   0.9 %  sodium chloride infusion  500 mL Intravenous Once Rollen Selders, Carlota Raspberry, MD        Allergies as of 08/14/2022 - Review Complete 08/14/2022  Allergen Reaction Noted   Losartan Hives and Swelling 09/05/2021    Family History  Problem Relation Age of Onset   Stroke Mother    Heart disease Mother    Stroke Father    Heart disease Father    Cancer Sister        type unknown   Heart disease Maternal Grandmother    Heart attack Maternal Grandfather    Colon polyps Paternal Grandmother 20   Colon cancer Paternal Grandmother 83   Stroke Paternal Grandfather    Stomach cancer Neg Hx    Rectal cancer Neg Hx    Esophageal cancer Neg Hx     Social History   Socioeconomic History   Marital status: Married    Spouse name: Not on file   Number of children: 2   Years of education: Not on file   Highest education level: Not on file  Occupational History   Occupation: Insurance account manager, part-time/ retired   Occupation: Tourist information centre manager firm-retired  Tobacco Use   Smoking status: Every Day    Packs/day: 1.00    Years: 50.00    Total pack years: 50.00    Types: Cigarettes    Start date: 12/10/1964   Smokeless tobacco: Never  Vaping Use   Vaping Use: Never used  Substance and Sexual Activity   Alcohol use: Yes    Alcohol/week: 0.0 - 2.0 standard drinks of alcohol    Comment: drinks beer; 3 or 4 a week    Drug use: No   Sexual activity: Yes  Other Topics Concern   Not on file  Social History Narrative   Not on file   Social  Determinants of Health   Financial Resource Strain: Low Risk  (12/29/2021)   Overall Financial Resource Strain (CARDIA)    Difficulty of Paying Living Expenses: Not hard at all  Food Insecurity: No Food Insecurity (12/29/2021)   Hunger Vital Sign    Worried About Running Out of Food in the Last Year: Never true    Ran Out of Food in the Last Year: Never true  Transportation Needs: No  Transportation Needs (12/29/2021)   PRAPARE - Hydrologist (Medical): No    Lack of Transportation (Non-Medical): No  Physical Activity: Insufficiently Active (12/29/2021)   Exercise Vital Sign    Days of Exercise per Week: 3 days    Minutes of Exercise per Session: 30 min  Stress: No Stress Concern Present (12/29/2021)   [place]    Feeling of Stress : Not at all  Social Connections: Moderately Integrated (12/29/2021)   Social Connection and Isolation Panel [NHANES]    Frequency of Communication with Friends and Family: Twice a week    Frequency of Social Gatherings with Friends and Family: Twice a week    Attends Religious Services: More than 4 times per year    Active Member of [employer] or Organizations: No    Attends Archivist Meetings: Never    Marital Status: Married  Human resources officer Violence: Not At Risk (12/29/2021)   Humiliation, Afraid, Rape, and Kick questionnaire    Fear of Current or Ex-Partner: No    Emotionally Abused: No    Physically Abused: No    Sexually Abused: No    Review of Systems: All other review of systems negative except as mentioned in the HPI.  Physical Exam: Vital signs BP 138/66   Pulse 60   Temp (!) 97.3 F (36.3 C) (Temporal)   Resp 18   Ht 6' (1.829 m)   Wt 179 lb (81.2 kg)   SpO2 92%   BMI 24.28 kg/m   General:   Alert,  Well-developed, pleasant and cooperative in NAD Lungs:  Clear throughout to auscultation.   Heart:  Regular rate and rhythm Abdomen:   Soft, nontender and nondistended.   Neuro/Psych:  Alert and cooperative. Normal mood and affect. A and O x 3  Jolly Mango, MD [facility]

## 2022-08-14 NOTE — Telephone Encounter (Signed)
Called and spoke with patient. He has been scheduled for a hemorrhoid banding appt with Dr. Havery Moros on Friday, 09/28/22 at 3:40 pm. Pt is aware that his appt information is available in [place]. Pt stated that he does not need a letter with his appt information. Pt verbalized understanding and had no concerns at the end of the call.

## 2022-08-14 NOTE — Telephone Encounter (Signed)
-----   Message from Yetta Flock, MD sent at 08/14/2022 12:07 PM EDT ----- Regarding: hemorrhoid banding Macee Venables can you help book this patient for a routine hemorrhoid banding? thanks

## 2022-08-14 NOTE — Op Note (Signed)
[facility] Patient Name: Edward Thornton Procedure Date: 08/14/2022 10:34 AM MRN: [id number] Endoscopist: Remo Lipps P. Havery Edward Thornton , MD Age: 76 Referring MD:  Date of Birth: 1946/12/05 Gender: Male Account #: [id number] Procedure:                Colonoscopy Indications:              Screening for colorectal malignant neoplasm -                            patient endorse intermittent bleeding from                            hemorrhoids for some time Medicines:                Monitored Anesthesia Care Procedure:                Pre-Anesthesia Assessment:                           - Prior to the procedure, a History and Physical                            was performed, and patient medications and                            allergies were reviewed. The patient's tolerance of                            previous anesthesia was also reviewed. The risks                            and benefits of the procedure and the sedation                            options and risks were discussed with the patient.                            All questions were answered, and informed consent                            was obtained. Prior Anticoagulants: The patient has                            taken Plavix (clopidogrel), last dose was 5 days                            prior to procedure. ASA Grade Assessment: III - A                            patient with severe systemic disease. After                            reviewing the risks and benefits, the patient was  deemed in satisfactory condition to undergo the                            procedure.                           After obtaining informed consent, the colonoscope                            was passed under direct vision. Throughout the                            procedure, the patient's blood pressure, pulse, and                            oxygen saturations were monitored continuously. The                             Olympus CF-HQ190L [id number]) Colonoscope was                            introduced through the anus and advanced to the the                            cecum, identified by appendiceal orifice and                            ileocecal valve. The colonoscopy was performed                            without difficulty. The patient tolerated the                            procedure well. The quality of the bowel                            preparation was good. The ileocecal valve,                            appendiceal orifice, and rectum were photographed. Scope In: 10:50:50 AM Scope Out: 11:18:07 AM Scope Withdrawal Time: 0 hours 19 minutes 3 seconds  Total Procedure Duration: 0 hours 27 minutes 17 seconds  Findings:                 Hemorrhoids were found on perianal exam (grade III)                           Multiple small-mouthed diverticula were found in                            the sigmoid colon.                           A few small angiodysplastic lesions were found in  the cecum.                           A diminutive polyp was found in the cecum. The                            polyp was sessile. The polyp was removed with a                            cold snare. Resection and retrieval were complete.                           A 3 mm polyp was found in the ileocecal valve. The                            polyp was sessile. The polyp was removed with a                            cold snare. Resection and retrieval were complete.                           A 4 to 5 mm polyp was found in the transverse                            colon. The polyp was sessile. The polyp was removed                            with a cold snare. Resection and retrieval were                            complete.                           Numerous suspected hyperplastic polyps were found                            in the left colon. The largest of these were                             removed as a representative sample via cold snare                            as outliend. Two sessile polyps were found in the                            sigmoid colon. The polyps were 4 mm in size. These                            polyps were removed with a cold snare. Resection                            and retrieval were complete.  Two sessile polyps were found in the recto-sigmoid                            colon. The polyps were 3 to 5 mm in size. These                            polyps were removed with a cold snare. Resection                            and retrieval were complete.                           A 3 to 4 mm polyp was found in the rectum. The                            polyp was sessile. The polyp was removed with a                            cold snare. Resection and retrieval were complete.                           Internal hemorrhoids were found during                            retroflexion. The hemorrhoids were large and had                            associated inflammatory changes consistent with                            prolapse.                           The exam was otherwise without abnormality. Complications:            No immediate complications. Estimated blood loss:                            Minimal. Estimated Blood Loss:     Estimated blood loss was minimal. Impression:               - Hemorrhoids found on perianal exam.                           - Diverticulosis in the sigmoid colon.                           - A few colonic angiodysplastic lesions.                           - One diminutive polyp in the cecum, removed with a                            cold snare. Resected and retrieved.                           -  One 3 mm polyp at the ileocecal valve, removed                            with a cold snare. Resected and retrieved.                           - One 4 to 5 mm polyp in the transverse colon,                             removed with a cold snare. Resected and retrieved.                           - Multiple suspected benign hyperplastic polyps of                            the left colon - largest lesions in the sigmoid,                            rectosigmoid, and rectum removed as representative                            sample                           - Internal hemorrhoids with prolapse change.                           - The examination was otherwise normal. Recommendation:           - Patient has a contact number available for                            emergencies. The signs and symptoms of potential                            delayed complications were discussed with the                            patient. Return to normal activities tomorrow.                            Written discharge instructions were provided to the                            patient.                           - Resume previous diet.                           - Continue present medications.                           - Resume Plavix tomorrow                           -  Await pathology results.                           - Daily fiber supplement such as Citrucel for                            hemorrhoids. Can use Calmol4 suppositories as                            needed (over the counter). Follow up in the office                            for hemorrhoid therapy if symptoms persist                            (consideration for banding) Remo Lipps P. Afreen Siebels, MD 08/14/2022 11:27:55 AM This report has been signed electronically.

## 2022-08-15 ENCOUNTER — Telehealth: Payer: Self-pay

## 2022-08-15 NOTE — Telephone Encounter (Signed)
  Follow up Call-     08/14/2022   10:01 AM  Call back number  Post procedure Call Back phone  # [phone]  Permission to leave phone message Yes     Patient questions:  Do you have a fever, pain , or abdominal swelling? No. Pain Score  0 *  Have you tolerated food without any problems? Yes.    Have you been able to return to your normal activities? Yes.    Do you have any questions about your discharge instructions: Diet   No. Medications  No. Follow up visit  No.  Do you have questions or concerns about your Care? No.  Actions: * If pain score is 4 or above: No action needed, pain <4.

## 2022-08-21 ENCOUNTER — Other Ambulatory Visit: Payer: Self-pay | Admitting: Internal Medicine

## 2022-08-28 ENCOUNTER — Ambulatory Visit (INDEPENDENT_AMBULATORY_CARE_PROVIDER_SITE_OTHER): Payer: Medicare Other

## 2022-08-28 DIAGNOSIS — Z23 Encounter for immunization: Secondary | ICD-10-CM

## 2022-08-28 NOTE — Progress Notes (Signed)
Pt received flu vaccine w/o any complications.  

## 2022-09-08 ENCOUNTER — Encounter: Payer: Self-pay | Admitting: Internal Medicine

## 2022-09-19 ENCOUNTER — Telehealth: Payer: Medicare Other

## 2022-09-28 ENCOUNTER — Ambulatory Visit: Payer: Medicare Other | Admitting: Gastroenterology

## 2022-09-28 ENCOUNTER — Encounter: Payer: Self-pay | Admitting: Gastroenterology

## 2022-09-28 VITALS — BP 132/78 | HR 69 | Ht 73.0 in | Wt 176.0 lb

## 2022-09-28 DIAGNOSIS — K642 Third degree hemorrhoids: Secondary | ICD-10-CM | POA: Diagnosis not present

## 2022-09-28 NOTE — Patient Instructions (Signed)
If you are age 75 or older, your body mass index should be between 23-30. Your Body mass index is 23.22 kg/m. If this is out of the aforementioned range listed, please consider follow up with your Primary Care Provider.  If you are age 64 or younger, your body mass index should be between 19-25. Your Body mass index is 23.22 kg/m. If this is out of the aformentioned range listed, please consider follow up with your Primary Care Provider.   ________________________________________________________  [place]   The procedure you have had should have been relatively painless since the banding of the area involved does not have nerve endings and there is no pain sensation.  The rubber band cuts off the blood supply to the hemorrhoid and the band may fall off as soon as 48 hours after the banding (the band may occasionally be seen in the toilet bowl following a bowel movement). You may notice a temporary feeling of fullness in the rectum which should respond adequately to plain Tylenol or Motrin.  Following the banding, avoid strenuous exercise that evening and resume full activity the next day.  A sitz bath (soaking in a warm tub) or bidet is soothing, and can be useful for cleansing the area after bowel movements.     To avoid constipation, take two tablespoons of natural wheat bran, natural oat bran, flax, Benefiber or any over the counter fiber supplement and increase your water intake to 7-8 glasses daily.    Unless you have been prescribed anorectal medication, do not put anything inside your rectum for two weeks: No suppositories, enemas, fingers, etc.  Occasionally, you may have more bleeding than usual after the banding procedure.  This is often from the untreated hemorrhoids rather than the treated one.  Don't be concerned if there is a tablespoon or so of blood.  If there is more blood than this, lie flat with your bottom higher than your head and  apply an ice pack to the area. If the bleeding does not stop within a half an hour or if you feel faint, call our office at [phone] or go to the emergency room.  Problems are not common; however, if there is a substantial amount of bleeding, severe pain, chills, fever or difficulty passing urine (very rare) or other problems, you should call us at [phone] or report to the nearest emergency room.  Do not stay seated continuously for more than 2-3 hours for a day or two after the procedure.  Tighten your buttock muscles 10-15 times every two hours and take 10-15 deep breaths every 1-2 hours.  Do not spend more than a few minutes on the toilet if you cannot empty your bowel; instead re-visit the toilet at a later time.    You have been scheduled for a 3rd banding on Monday, 11-13 at 4:00 pm. If you need to reschedule this appointment please call us right away at [phone].  Thank you for entrusting me with your care and for choosing [facility], Dr. Anton Ruiz Cellar

## 2022-09-28 NOTE — Progress Notes (Signed)
76 year old male accompanied by his wife today to discuss hemorrhoid banding.  I performed a surveillance colonoscopy for him in September.  He had grade 3 prolapsing hemorrhoids, multiple polyps removed as outlined below.  He has had significant hemorrhoid symptoms for the past 1-1/2 years.  Continues to have swelling after bowel movements routinely, occasional bleeding that can sometimes be quite concerning in the setting of Plavix use, and grade 3 prolapse.  He denies any constipation or straining.  He is frustrated by his persistent symptoms.  Hemorrhoids were large and inflamed on recent colonoscopy.  He is on Plavix, we discussed options for hemorrhoid treatment to include medical therapy, hemorrhoid banding, surgical treatment.  Following discussion of options he wants to proceed with hemorrhoid banding.  He understands increased risk for bleeding being on Plavix however risk is generally low (baseline risk less than 1:1000, slightly increased while being on Plavix).   Colonoscopy 08/14/22: Hemorrhoids were found on perianal exam (grade III) - Multiple small-mouthed diverticula were found in the sigmoid colon. 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 - A diminutive polyp was found in the cecum. The polyp was sessile. The polyp was removed with a cold snare. Resection and retrieval were complete. - A 3 mm polyp was found in the ileocecal valve. The polyp was sessile. The polyp was removed with a cold snare. Resection and retrieval were complete. - A 4 to 5 mm polyp was found in the transverse colon. The polyp was sessile. The polyp was removed with a cold snare. Resection and retrieval were complete. - Numerous suspected hyperplastic polyps were found in the left colon. The largest of these were removed as a representative sample via cold snare as outliend. Two sessile polyps were found in the sigmoid colon. The polyps were 4 mm in size. These polyps were removed  with a cold snare. Resection and retrieval were complete. - Two sessile polyps were found in the recto-sigmoid colon. The polyps were 3 to 5 mm in size. These polyps were removed with a cold snare. Resection and retrieval were complete. - A 3 to 4 mm polyp was found in the rectum. The polyp was sessile. The polyp was removed with a cold snare. Resection and retrieval were complete. - Internal hemorrhoids were found during retroflexion. The hemorrhoids were large and had associated inflammatory changes consistent with prolapse. - The exam was otherwise without abnormality.  1. Surgical [P], colon, sigmoid, rectosigmoid, rectum, polyp (5) - TUBULAR ADENOMA (S) AND HYPERPLASTIC POLYP (S). - NEGATIVE FOR HIGH-GRADE DYSPLASIA. 2. Surgical [P], colon, transverse, cecum, IC valve, polyp (3) - FRAGMENTS OF A TUBULAR ADENOMA (1). - HYPERPLASTIC POLYP (2). - NEGATIVE FOR HIGH-GRADE DYSPLASIA.  Repeat colon in 3 years   PROCEDURE NOTE: The patient presents with symptomatic grade III  hemorrhoids, requesting rubber band ligation of his/her hemorrhoidal disease.  All risks, benefits and alternative forms of therapy were described and informed consent was obtained.  The anorectum was pre-medicated with 0.125% nitroglycerin The decision was made to band the LL internal hemorrhoid, and the [facility] system was used to perform band ligation without complication.  Digital anorectal examination was then performed to assure proper positioning of the band, and to adjust the banded tissue as required.  The patient was discharged home without pain or other issues.  Dietary and behavioral recommendations were given and along with follow-up instructions.     The patient will return in 2-4 weeks for  follow-up and possible additional  banding as required. No complications were encountered and the patient tolerated the procedure well.  Jolly Mango, MD [facility]

## 2022-10-22 ENCOUNTER — Encounter: Payer: Self-pay | Admitting: Gastroenterology

## 2022-10-22 ENCOUNTER — Ambulatory Visit: Payer: Medicare Other | Admitting: Gastroenterology

## 2022-10-22 VITALS — BP 130/70 | HR 64 | Ht 73.0 in | Wt 176.4 lb

## 2022-10-22 DIAGNOSIS — K642 Third degree hemorrhoids: Secondary | ICD-10-CM

## 2022-10-22 NOTE — Patient Instructions (Signed)
If you are age 75 or older, your body mass index should be between 23-30. Your Body mass index is 23.27 kg/m. If this is out of the aforementioned range listed, please consider follow up with your Primary Care Provider.  If you are age 85 or younger, your body mass index should be between 19-25. Your Body mass index is 23.27 kg/m. If this is out of the aformentioned range listed, please consider follow up with your Primary Care Provider.   ________________________________________________________  [place]   The procedure you have had should have been relatively painless since the banding of the area involved does not have nerve endings and there is no pain sensation.  The rubber band cuts off the blood supply to the hemorrhoid and the band may fall off as soon as 48 hours after the banding (the band may occasionally be seen in the toilet bowl following a bowel movement). You may notice a temporary feeling of fullness in the rectum which should respond adequately to plain Tylenol or Motrin.  Following the banding, avoid strenuous exercise that evening and resume full activity the next day.  A sitz bath (soaking in a warm tub) or bidet is soothing, and can be useful for cleansing the area after bowel movements.     To avoid constipation, take two tablespoons of natural wheat bran, natural oat bran, flax, Benefiber or any over the counter fiber supplement and increase your water intake to 7-8 glasses daily.    Unless you have been prescribed anorectal medication, do not put anything inside your rectum for two weeks: No suppositories, enemas, fingers, etc.  Occasionally, you may have more bleeding than usual after the banding procedure.  This is often from the untreated hemorrhoids rather than the treated one.  Don't be concerned if there is a tablespoon or so of blood.  If there is more blood than this, lie flat with your bottom higher than your head and  apply an ice pack to the area. If the bleeding does not stop within a half an hour or if you feel faint, call our office at [phone] or go to the emergency room.  Problems are not common; however, if there is a substantial amount of bleeding, severe pain, chills, fever or difficulty passing urine (very rare) or other problems, you should call us at [phone] or report to the nearest emergency room.  Do not stay seated continuously for more than 2-3 hours for a day or two after the procedure.  Tighten your buttock muscles 10-15 times every two hours and take 10-15 deep breaths every 1-2 hours.  Do not spend more than a few minutes on the toilet if you cannot empty your bowel; instead re-visit the toilet at a later time.    Thank you for entrusting me with your care and for choosing [facility], Dr. Roy Cellar

## 2022-10-22 NOTE — Progress Notes (Signed)
76 year old male here for follow up hemorrhoid banding.  I performed a surveillance colonoscopy for him in September.  He had grade 3 prolapsing hemorrhoids, multiple polyps removed as outlined below.  He has had significant hemorrhoid symptoms for the past 1-1/2 years.  Swelling, bleeding, grade 3 prolapse are his main symptoms. Hemorrhoids were large and inflamed on recent colonoscopy.   He has been on Plavix and we discussed banding at the last visit. We continued Plavix and performed banding of the LL hemorrhoid at that time on 09/28/22.  He reports improvement since the first banding.  He has had only episode of rectal bleeding since his last visit.  Previously he was having some weeks with bleeding with every bowel movement.  He also has a history of prolapse and swelling.  He states the prolapse has improved as well.  He wishes to proceed with additional banding after discussion with the patient and his wife today.  He remains on Plavix.  He understands he has higher than average risk for post hemorrhoid banding related bleeding while on Plavix compared to baseline risk which is < 1:1000.    Colonoscopy 08/14/22: Hemorrhoids were found on perianal exam (grade III) - Multiple small-mouthed diverticula were found in the sigmoid colon. 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 - A diminutive polyp was found in the cecum. The polyp was sessile. The polyp was removed with a cold snare. Resection and retrieval were complete. - A 3 mm polyp was found in the ileocecal valve. The polyp was sessile. The polyp was removed with a cold snare. Resection and retrieval were complete. - A 4 to 5 mm polyp was found in the transverse colon. The polyp was sessile. The polyp was removed with a cold snare. Resection and retrieval were complete. - Numerous suspected hyperplastic polyps were found in the left colon. The largest of these were removed as a representative sample via cold snare as  outliend. Two sessile polyps were found in the sigmoid colon. The polyps were 4 mm in size. These polyps were removed with a cold snare. Resection and retrieval were complete. - Two sessile polyps were found in the recto-sigmoid colon. The polyps were 3 to 5 mm in size. These polyps were removed with a cold snare. Resection and retrieval were complete. - A 3 to 4 mm polyp was found in the rectum. The polyp was sessile. The polyp was removed with a cold snare. Resection and retrieval were complete. - Internal hemorrhoids were found during retroflexion. The hemorrhoids were large and had associated inflammatory changes consistent with prolapse. - The exam was otherwise without abnormality.   1. Surgical [P], colon, sigmoid, rectosigmoid, rectum, polyp (5) - TUBULAR ADENOMA (S) AND HYPERPLASTIC POLYP (S). - NEGATIVE FOR HIGH-GRADE DYSPLASIA. 2. Surgical [P], colon, transverse, cecum, IC valve, polyp (3) - FRAGMENTS OF A TUBULAR ADENOMA (1). - HYPERPLASTIC POLYP (2). - NEGATIVE FOR HIGH-GRADE DYSPLASIA.   Repeat colon in 3 years     PROCEDURE NOTE: The patient presents with symptomatic grade III  hemorrhoids, requesting rubber band ligation of his/her hemorrhoidal disease.  All risks, benefits and alternative forms of therapy were described and informed consent was obtained.   The anorectum was pre-medicated with 0.125% nitroglycerin The decision was made to band the RP internal hemorrhoid, and the [facility] system was used to perform band ligation without complication.  Digital anorectal examination was then performed to assure proper positioning of the band, and to adjust the banded tissue  as required.  The patient was discharged home without pain or other issues.  Dietary and behavioral recommendations were given and along with follow-up instructions.     The patient will return in 2-4 weeks for  follow-up and possible additional banding as required. No complications were encountered and  the patient tolerated the procedure well.   Jolly Mango, MD [facility]

## 2022-10-24 ENCOUNTER — Other Ambulatory Visit: Payer: Self-pay | Admitting: Internal Medicine

## 2022-10-31 ENCOUNTER — Telehealth: Payer: Self-pay | Admitting: Internal Medicine

## 2022-10-31 DIAGNOSIS — I739 Peripheral vascular disease, unspecified: Secondary | ICD-10-CM

## 2022-10-31 NOTE — Telephone Encounter (Signed)
[place] - Did the [place] flo testing on Monday and his left leg was abnormal - result .34 - right leg .82.  In 2020 left leg was .68 and right leg normal - patient complains of leg fatigue.

## 2022-10-31 NOTE — Telephone Encounter (Signed)
I have ordered an ultrasound of the leg to look at blood flow

## 2022-11-12 ENCOUNTER — Encounter: Payer: Self-pay | Admitting: Internal Medicine

## 2022-11-12 ENCOUNTER — Ambulatory Visit (INDEPENDENT_AMBULATORY_CARE_PROVIDER_SITE_OTHER): Payer: Medicare Other | Admitting: Internal Medicine

## 2022-11-12 VITALS — BP 120/60 | HR 73 | Temp 97.8°F | Ht 73.0 in | Wt 175.0 lb

## 2022-11-12 DIAGNOSIS — I739 Peripheral vascular disease, unspecified: Secondary | ICD-10-CM

## 2022-11-12 DIAGNOSIS — E782 Mixed hyperlipidemia: Secondary | ICD-10-CM | POA: Diagnosis not present

## 2022-11-12 DIAGNOSIS — Z8673 Personal history of transient ischemic attack (TIA), and cerebral infarction without residual deficits: Secondary | ICD-10-CM | POA: Diagnosis not present

## 2022-11-12 DIAGNOSIS — Z0000 Encounter for general adult medical examination without abnormal findings: Secondary | ICD-10-CM | POA: Diagnosis not present

## 2022-11-12 DIAGNOSIS — Z720 Tobacco use: Secondary | ICD-10-CM

## 2022-11-12 DIAGNOSIS — I10 Essential (primary) hypertension: Secondary | ICD-10-CM

## 2022-11-12 LAB — COMPREHENSIVE METABOLIC PANEL
ALT: 18 U/L (ref 0–53)
AST: 18 U/L (ref 0–37)
Albumin: 4.3 g/dL (ref 3.5–5.2)
Alkaline Phosphatase: 72 U/L (ref 39–117)
BUN: 16 mg/dL (ref 6–23)
CO2: 31 mEq/L (ref 19–32)
Calcium: 10.4 mg/dL (ref 8.4–10.5)
Chloride: 103 mEq/L (ref 96–112)
Creatinine, Ser: 1.04 mg/dL (ref 0.40–1.50)
GFR: 70.14 mL/min (ref >60.00)
Glucose, Bld: 92 mg/dL (ref 70–99)
Potassium: 3.8 mEq/L (ref 3.5–5.1)
Sodium: 141 mEq/L (ref 135–145)
Total Bilirubin: 0.8 mg/dL (ref 0.2–1.2)
Total Protein: 7.4 g/dL (ref 6.0–8.3)

## 2022-11-12 LAB — CBC
HCT: 47.3 % (ref 39.0–52.0)
Hemoglobin: 16.4 g/dL (ref 13.0–17.0)
MCHC: 34.6 g/dL (ref 30.0–36.0)
MCV: 95.6 fl (ref 78.0–100.0)
Platelets: 296 10*3/uL (ref 150.0–400.0)
RBC: 4.94 Mil/uL (ref 4.22–5.81)
RDW: 12.8 % (ref 11.5–15.5)
WBC: 7.3 10*3/uL (ref 4.0–10.5)

## 2022-11-12 LAB — LIPID PANEL
Cholesterol: 133 mg/dL (ref 0–200)
HDL: 43.6 mg/dL (ref >39.00)
LDL Cholesterol: 55 mg/dL (ref 0–99)
NonHDL: 89.62
Total CHOL/HDL Ratio: 3
Triglycerides: 175 mg/dL — ABNORMAL HIGH (ref 0.0–149.0)
VLDL: 35 mg/dL (ref 0.0–40.0)

## 2022-11-12 LAB — HEMOGLOBIN A1C: Hgb A1c MFr Bld: 6.1 % (ref 4.6–6.5)

## 2022-11-12 NOTE — Progress Notes (Signed)
   Subjective:   Patient ID: Edward Thornton, male    DOB: 1946-09-16, 76 y.o.   MRN: [id number]  HPI The patient is here for physical.  PMH, [facility], social history reviewed and updated  Review of Systems  Constitutional: Negative.   HENT: Negative.    Eyes: Negative.   Respiratory:  Negative for cough, chest tightness and shortness of breath.   Cardiovascular:  Negative for chest pain, palpitations and leg swelling.  Gastrointestinal:  Negative for abdominal distention, abdominal pain, constipation, diarrhea, nausea and vomiting.  Musculoskeletal:  Positive for arthralgias.  Skin: Negative.   Neurological: Negative.   Psychiatric/Behavioral: Negative.      Objective:  Physical Exam Constitutional:      Appearance: He is well-developed.  HENT:     Head: Normocephalic and atraumatic.  Cardiovascular:     Rate and Rhythm: Normal rate and regular rhythm.  Pulmonary:     Effort: Pulmonary effort is normal. No respiratory distress.     Breath sounds: Normal breath sounds. No wheezing or rales.  Abdominal:     General: Bowel sounds are normal. There is no distension.     Palpations: Abdomen is soft.     Tenderness: There is no abdominal tenderness. There is no rebound.  Musculoskeletal:     Cervical back: Normal range of motion.  Skin:    General: Skin is warm and dry.  Neurological:     Mental Status: He is alert and oriented to person, place, and time.     Coordination: Coordination normal.     Vitals:   11/12/22 1001  BP: 120/60  Pulse: 73  Temp: 97.8 F (36.6 C)  TempSrc: Oral  SpO2: 92%  Weight: 175 lb (79.4 kg)  Height: '6\' 1"'$  (1.854 m)    Assessment & Plan:

## 2022-11-12 NOTE — Assessment & Plan Note (Signed)
Flu shot up to date. Covid-19 counseled. Pneumonia up to date. Shingrix complete. Tetanus up to date. Colonoscopy up to date. Counseled about sun safety and mole surveillance. Counseled about the dangers of distracted driving. Given 10 year screening recommendations.

## 2022-11-12 NOTE — Assessment & Plan Note (Signed)
Checking CMP, CBC, lipid panel and HgA1c. Taking plavix daily and statin. Is still smoking and reminded that this doubles his future risk for heart attack and stroke. No new stroke symptoms.

## 2022-11-12 NOTE — Assessment & Plan Note (Signed)
Smoking about 1PPD and no plans to quit. Counseled about need and risk/harm from smoking. Lung cancer screening scheduled for later this week.

## 2022-11-12 NOTE — Assessment & Plan Note (Signed)
BP at goal on amlodipine 10 mg daily and metoprolol 50 mg daily and hctz 25 mg daily. Checking CMP and adjust as needed.

## 2022-11-12 NOTE — Assessment & Plan Note (Signed)
Checking lipid panel and adjust atorvastatin 40 mg daily as needed for goal LDL <70.

## 2022-11-12 NOTE — Assessment & Plan Note (Signed)
ABI done by insurance at home with drop in ABI since last year. Ordered assessment and this will be done in 2 weeks. No claudication symptoms but does get leg heaviness with standing for prolonged time.

## 2022-11-15 ENCOUNTER — Ambulatory Visit (HOSPITAL_COMMUNITY): Payer: Medicare Other

## 2022-11-16 ENCOUNTER — Encounter (HOSPITAL_COMMUNITY): Payer: Self-pay

## 2022-11-16 ENCOUNTER — Ambulatory Visit (HOSPITAL_COMMUNITY)
Admission: RE | Admit: 2022-11-16 | Discharge: 2022-11-16 | Disposition: A | Discharge status: Home / Self Care | Payer: Medicare Other | Source: Ambulatory Visit | Attending: Acute Care | Admitting: Acute Care

## 2022-11-16 DIAGNOSIS — Z87891 Personal history of nicotine dependence: Secondary | ICD-10-CM | POA: Diagnosis not present

## 2022-11-16 DIAGNOSIS — F17200 Nicotine dependence, unspecified, uncomplicated: Secondary | ICD-10-CM | POA: Insufficient documentation

## 2022-11-16 DIAGNOSIS — F17210 Nicotine dependence, cigarettes, uncomplicated: Secondary | ICD-10-CM | POA: Diagnosis not present

## 2022-11-19 ENCOUNTER — Other Ambulatory Visit: Payer: Self-pay | Admitting: Acute Care

## 2022-11-19 DIAGNOSIS — F17210 Nicotine dependence, cigarettes, uncomplicated: Secondary | ICD-10-CM

## 2022-11-19 DIAGNOSIS — Z87891 Personal history of nicotine dependence: Secondary | ICD-10-CM

## 2022-11-19 DIAGNOSIS — Z122 Encounter for screening for malignant neoplasm of respiratory organs: Secondary | ICD-10-CM

## 2022-11-22 ENCOUNTER — Ambulatory Visit (HOSPITAL_COMMUNITY)
Admission: RE | Admit: 2022-11-22 | Discharge: 2022-11-22 | Disposition: A | Discharge status: Home / Self Care | Payer: Medicare Other | Source: Ambulatory Visit | Attending: Cardiovascular Disease | Admitting: Cardiovascular Disease

## 2022-11-22 DIAGNOSIS — I739 Peripheral vascular disease, unspecified: Secondary | ICD-10-CM | POA: Diagnosis not present

## 2022-11-23 ENCOUNTER — Other Ambulatory Visit: Payer: Self-pay | Admitting: Internal Medicine

## 2022-11-23 DIAGNOSIS — I739 Peripheral vascular disease, unspecified: Secondary | ICD-10-CM

## 2022-11-27 ENCOUNTER — Ambulatory Visit: Payer: Medicare Other | Admitting: Vascular Surgery

## 2022-11-27 ENCOUNTER — Encounter: Payer: Self-pay | Admitting: Vascular Surgery

## 2022-11-27 VITALS — BP 138/77 | HR 54 | Temp 97.7°F | Resp 16 | Ht 72.0 in | Wt 172.0 lb

## 2022-11-27 DIAGNOSIS — I739 Peripheral vascular disease, unspecified: Secondary | ICD-10-CM | POA: Diagnosis not present

## 2022-11-27 NOTE — Progress Notes (Signed)
Patient name: Edward Thornton MRN: [id number] DOB: 05-Sep-1946 Sex: male  REASON FOR CONSULT: PAD  HPI: Edward Thornton is a 76 y.o. male, with history of hypertension, hyperlipidemia and tobacco abuse who presents for evaluation of PAD.  Patient is here with his wife who provides some of the history.  States he was having a home health screen through his insurance and was told he had PAD in the left leg.  States he does not really endorse any pain in the left calf or thigh or buttock when walking.  No pain in the foot at night.  Does state that his left knee has been bothering him and he is contemplating knee surgery.  Able to do all of his yard work.  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   Cataract    bilateral sx   Emphysema of lung ([place])    smoker- pt told per scans (emphysema is seen on the scans)   Hyperlipidemia    on meds   Hyperplastic colon polyp    Hypertension    on meds   Seasonal allergies    Stroke [facility]) 08/2021   08/2021- tPA administered to patient    Past Surgical History:  Procedure Laterality Date   CATARACT EXTRACTION, BILATERAL Bilateral    COLONOSCOPY  2013   DJ-prep good-int/ext hems-recall 10 yrs   INGUINAL HERNIA REPAIR Left 12/10/1997   KNEE ARTHROSCOPY Right 12/10/1988   KNEE ARTHROSCOPY Left 12/10/2008   PILONIDAL CYST EXCISION  12/10/1978   spine    Family History  Problem Relation Age of Onset   Stroke Mother    Heart disease Mother    Stroke Father    Heart disease Father    Cancer Sister        type unknown   Heart disease Maternal Grandmother    Heart attack Maternal Grandfather    Colon polyps Paternal Grandmother 71   Colon cancer Paternal Grandmother 47   Stroke Paternal Grandfather    Stomach cancer Neg Hx    Rectal cancer Neg Hx    Esophageal cancer Neg Hx     SOCIAL HISTORY: Social History   Socioeconomic History   Marital status: Married    Spouse name: Not on file    Number of children: 2   Years of education: Not on file   Highest education level: Not on file  Occupational History   Occupation: Insurance account manager, part-time/ retired   Occupation: Tourist information centre manager firm-retired  Tobacco Use   Smoking status: Every Day    Packs/day: 1.00    Years: 50.00    Total pack years: 50.00    Types: Cigarettes    Start date: 12/10/1964   Smokeless tobacco: Never  Vaping Use   Vaping Use: Never used  Substance and Sexual Activity   Alcohol use: Yes    Alcohol/week: 0.0 - 2.0 standard drinks of alcohol    Comment: drinks beer; 3 or 4 a week    Drug use: No   Sexual activity: Yes  Other Topics Concern   Not on file  Social History Narrative   Not on file   Social Determinants of Health   Financial Resource Strain: Low Risk  (12/29/2021)   Overall Financial Resource Strain (CARDIA)    Difficulty of Paying Living Expenses: Not hard at all  Food Insecurity: No Food Insecurity (12/29/2021)   Hunger Vital Sign  Worried About Charity fundraiser in the Last Year: Never true    [place] in the Last Year: Never true  Transportation Needs: No Transportation Needs (12/29/2021)   PRAPARE - Hydrologist (Medical): No    Lack of Transportation (Non-Medical): No  Physical Activity: Insufficiently Active (12/29/2021)   Exercise Vital Sign    Days of Exercise per Week: 3 days    Minutes of Exercise per Session: 30 min  Stress: No Stress Concern Present (12/29/2021)   [place]    Feeling of Stress : Not at all  Social Connections: Moderately Integrated (12/29/2021)   Social Connection and Isolation Panel [NHANES]    Frequency of Communication with Friends and Family: Twice a week    Frequency of Social Gatherings with Friends and Family: Twice a week    Attends Religious Services: More than 4 times per year    Active Member of [employer] or Organizations: No     Attends Archivist Meetings: Never    Marital Status: Married  Human resources officer Violence: Not At Risk (12/29/2021)   Humiliation, Afraid, Rape, and Kick questionnaire    Fear of Current or Ex-Partner: No    Emotionally Abused: No    Physically Abused: No    Sexually Abused: No    Allergies  Allergen Reactions   Losartan Hives and Swelling    Current Outpatient Medications  Medication Sig Dispense Refill   acetaminophen (TYLENOL) 500 MG tablet Take 1,000 mg by mouth every 6 (six) hours as needed for mild pain.     amLODipine (NORVASC) 10 MG tablet Take 1 tablet (10 mg total) by mouth daily. Annual appt due in Dec must see provider for future refills 90 tablet 0   amoxicillin (AMOXIL) 500 MG capsule Take 500 mg by mouth 3 (three) times daily.     atorvastatin (LIPITOR) 40 MG tablet TAKE 1 TABLET BY MOUTH DAILY 90 tablet 1   clopidogrel (PLAVIX) 75 MG tablet TAKE 1 TABLET BY MOUTH DAILY 90 tablet 1   hydrocortisone (ANUSOL-HC) 2.5 % rectal cream Place 1 application. rectally 2 (two) times daily. 30 g 1   Menthol-Methyl Salicylate (SALONPAS PAIN RELIEF PATCH EX) Apply 1 patch topically daily as needed (knee pain).     metoprolol succinate (TOPROL-XL) 50 MG 24 hr tablet Take 1 tablet (50 mg total) by mouth daily. Take with or immediately following a meal. 90 tablet 3   Multiple Vitamin (MULTIVITAMIN) tablet Take 1 tablet by mouth daily.     Omega-3 Fatty Acids (FISH OIL) 1200 MG CAPS Take 1 capsule (1,200 mg total) by mouth daily. Take 1 capsule daily (Patient taking differently: Take 1,000 mg by mouth daily.) 90 capsule 3   triamterene-hydrochlorothiazide (MAXZIDE-25) 37.5-25 MG tablet TAKE 1 TABLET BY MOUTH DAILY 90 tablet 1   Turmeric 500 MG TABS Take 500 mg by mouth. Take twice per day     No current facility-administered medications for this visit.    REVIEW OF SYSTEMS:  '[X]'$  denotes positive finding, '[ ]'$  denotes negative finding Cardiac  Comments:  Chest pain or chest  pressure:    Shortness of breath upon exertion:    Short of breath when lying flat:    Irregular heart rhythm:        Vascular    Pain in calf, thigh, or hip brought on by ambulation:    Pain in feet at night that wakes  you up from your sleep:     Blood clot in your veins:    Leg swelling:         Pulmonary    Oxygen at home:    Productive cough:     Wheezing:         Neurologic    Sudden weakness in arms or legs:     Sudden numbness in arms or legs:     Sudden onset of difficulty speaking or slurred speech:    Temporary loss of vision in one eye:     Problems with dizziness:         Gastrointestinal    Blood in stool:     Vomited blood:         Genitourinary    Burning when urinating:     Blood in urine:        Psychiatric    Major depression:         Hematologic    Bleeding problems:    Problems with blood clotting too easily:        Skin    Rashes or ulcers:        Constitutional    Fever or chills:      PHYSICAL EXAM: Vitals:   11/27/22 1034  BP: 138/77  Pulse: (!) 54  Resp: 16  Temp: 97.7 F (36.5 C)  TempSrc: Temporal  SpO2: 93%  Weight: 172 lb (78 kg)  Height: 6' (1.829 m)    GENERAL: The patient is a well-nourished male, in no acute distress. The vital signs are documented above. CARDIAC: There is a regular rate and rhythm.  VASCULAR:  Right femoral pulse palpable Left femoral pulse non-palpable Right PT palpable No left pedal pulses palpable, no tissue loss PULMONARY: No respiratory distress ABDOMEN: Soft and non-tender. MUSCULOSKELETAL: There are no major deformities or cyanosis. NEUROLOGIC: No focal weakness or paresthesias are detected. PSYCHIATRIC: The patient has a normal affect.  DATA:   ABI's 0.97 right triphasic and 0.63 left monophasic   Assessment/Plan:  76 year old male that presents for evaluation of PAD.  Sounds like his peripheral arterial disease was diagnosed through home health screen with his insurance company.   His ABIs on the left are 0.63 suggestive of moderate disease with monophasic waveforms.  I have a hard time appreciating a left femoral pulse so expect he has inflow disease.  Fortunately he has asymptomatic peripheral arterial disease.  He does not endorse any claudication of the left lower extremity and has no rest pain or wounds to suggest critical limb ischemia.  I discussed risk factor modifications and to continue his Plavix and statin as well as smoking cessation and walking therapies.  Discussed walking 30 minutes a day 3 times a week.  Discussed there is no indication for intervention for asymptomatic PAD.  I will see him back in 6 months with ABIs for continued surveillance.   Marty Heck, MD [facility] Office: [phone]

## 2022-11-28 ENCOUNTER — Other Ambulatory Visit: Payer: Self-pay | Admitting: Internal Medicine

## 2022-12-04 ENCOUNTER — Other Ambulatory Visit: Payer: Self-pay

## 2022-12-04 DIAGNOSIS — I739 Peripheral vascular disease, unspecified: Secondary | ICD-10-CM

## 2022-12-13 ENCOUNTER — Encounter: Payer: Self-pay | Admitting: Gastroenterology

## 2022-12-13 ENCOUNTER — Encounter: Payer: Self-pay | Admitting: Internal Medicine

## 2022-12-13 ENCOUNTER — Ambulatory Visit: Payer: Medicare Other | Admitting: Gastroenterology

## 2022-12-13 VITALS — BP 130/80 | HR 63 | Ht 72.0 in | Wt 179.2 lb

## 2022-12-13 DIAGNOSIS — K642 Third degree hemorrhoids: Secondary | ICD-10-CM

## 2022-12-13 NOTE — Progress Notes (Signed)
77 year old male here for follow up hemorrhoid banding.  I performed a surveillance colonoscopy for him in September.  He had grade 3 prolapsing hemorrhoids, multiple polyps removed as outlined below.  He has had significant hemorrhoid symptoms for the past 1-1/2 years.  Swelling, bleeding, grade 3 prolapse are his main symptoms. Hemorrhoids were large and inflamed on recent colonoscopy.   He has been on Plavix and we discussed banding at the last visit. We continued Plavix and performed banding of the LL hemorrhoid at that time on 09/28/22.  RP hemorrhoid banded on 10/22/22. Overall he tolerated the banding well without pain.  He states he continues to have some swelling and intermittent bleeding despite the first 2 banding's.  He states he is not having any constipation.  He remains on Plavix.  We discussed at length if he wanted to attempt another banding again today to try to control this more conservatively or if he wanted to pursue a surgical evaluation etc.  We discussed surgery which would be difficult for him to recover from, he is higher than average risk for surgery, want to avoid that if at all possible.  After this discussion he wants to proceed with another banding today.He remains on Plavix.  He understands he has higher than average risk for post hemorrhoid banding related bleeding while on Plavix compared to baseline risk which is < 1:1000.      Colonoscopy 08/14/22: Hemorrhoids were found on perianal exam (grade III) - Multiple small-mouthed diverticula were found in the sigmoid colon. 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 - A diminutive polyp was found in the cecum. The polyp was sessile. The polyp was removed with a cold snare. Resection and retrieval were complete. - A 3 mm polyp was found in the ileocecal valve. The polyp was sessile. The polyp was removed with a cold snare. Resection and retrieval were complete. - A 4 to 5 mm polyp was found in the  transverse colon. The polyp was sessile. The polyp was removed with a cold snare. Resection and retrieval were complete. - Numerous suspected hyperplastic polyps were found in the left colon. The largest of these were removed as a representative sample via cold snare as outliend. Two sessile polyps were found in the sigmoid colon. The polyps were 4 mm in size. These polyps were removed with a cold snare. Resection and retrieval were complete. - Two sessile polyps were found in the recto-sigmoid colon. The polyps were 3 to 5 mm in size. These polyps were removed with a cold snare. Resection and retrieval were complete. - A 3 to 4 mm polyp was found in the rectum. The polyp was sessile. The polyp was removed with a cold snare. Resection and retrieval were complete. - Internal hemorrhoids were found during retroflexion. The hemorrhoids were large and had associated inflammatory changes consistent with prolapse. - The exam was otherwise without abnormality.   1. Surgical [P], colon, sigmoid, rectosigmoid, rectum, polyp (5) - TUBULAR ADENOMA (S) AND HYPERPLASTIC POLYP (S). - NEGATIVE FOR HIGH-GRADE DYSPLASIA. 2. Surgical [P], colon, transverse, cecum, IC valve, polyp (3) - FRAGMENTS OF A TUBULAR ADENOMA (1). - HYPERPLASTIC POLYP (2). - NEGATIVE FOR HIGH-GRADE DYSPLASIA.   Repeat colon in 3 years     PROCEDURE NOTE: The patient presents with symptomatic grade III  hemorrhoids, requesting rubber band ligation of his/her hemorrhoidal disease.  All risks, benefits and alternative forms of therapy were described and informed consent was obtained.   The anorectum  was pre-medicated with 0.125% nitroglycerin The decision was made to band the RA / LL region internal hemorrhoid, and the [facility] system was used to perform band ligation without complication (initially RA area attempted to be banded but after a few attempts in that area, band would not adhere, so slightly moved positioning to junction of  RA / LL). Digital anorectal examination was then performed to assure proper positioning of the band, and to adjust the banded tissue as required.  The patient was discharged home without pain or other issues.  Dietary and behavioral recommendations were given and along with follow-up instructions.     The patient will return in 4 weeks for  follow-up. No complications were encountered and the patient tolerated the procedure well.   Jolly Mango, MD [facility]

## 2022-12-13 NOTE — Patient Instructions (Addendum)
If you are age 76 or older, your body mass index should be between 23-30. Your Body mass index is 24.31 kg/m. If this is out of the aforementioned range listed, please consider follow up with your Primary Care Provider.  If you are age 45 or younger, your body mass index should be between 19-25. Your Body mass index is 24.31 kg/m. If this is out of the aformentioned range listed, please consider follow up with your Primary Care Provider.   ________________________________________________________   [place]   The procedure you have had should have been relatively painless since the banding of the area involved does not have nerve endings and there is no pain sensation.  The rubber band cuts off the blood supply to the hemorrhoid and the band may fall off as soon as 48 hours after the banding (the band may occasionally be seen in the toilet bowl following a bowel movement). You may notice a temporary feeling of fullness in the rectum which should respond adequately to plain Tylenol or Motrin.  Following the banding, avoid strenuous exercise that evening and resume full activity the next day.  A sitz bath (soaking in a warm tub) or bidet is soothing, and can be useful for cleansing the area after bowel movements.     To avoid constipation, take two tablespoons of natural wheat bran, natural oat bran, flax, Benefiber or any over the counter fiber supplement and increase your water intake to 7-8 glasses daily.    Unless you have been prescribed anorectal medication, do not put anything inside your rectum for two weeks: No suppositories, enemas, fingers, etc.  Occasionally, you may have more bleeding than usual after the banding procedure.  This is often from the untreated hemorrhoids rather than the treated one.  Don't be concerned if there is a tablespoon or so of blood.  If there is more blood than this, lie flat with your bottom higher than your head and  apply an ice pack to the area. If the bleeding does not stop within a half an hour or if you feel faint, call our office at [phone] or go to the emergency room.  Problems are not common; however, if there is a substantial amount of bleeding, severe pain, chills, fever or difficulty passing urine (very rare) or other problems, you should call us at [phone] or report to the nearest emergency room.  Do not stay seated continuously for more than 2-3 hours for a day or two after the procedure.  Tighten your buttock muscles 10-15 times every two hours and take 10-15 deep breaths every 1-2 hours.  Do not spend more than a few minutes on the toilet if you cannot empty your bowel; instead re-visit the toilet at a later time.    We have scheduled you for a follow up appointment on Tuesday, 2-6 at 3:40 pm.  Thank you for entrusting me with your care and for choosing [facility], Dr. Boykin Cellar

## 2022-12-17 LAB — VITAMIN D 25 HYDROXY (VIT D DEFICIENCY, FRACTURES): Vit D, 25-Hydroxy: 34.56

## 2022-12-31 ENCOUNTER — Ambulatory Visit (INDEPENDENT_AMBULATORY_CARE_PROVIDER_SITE_OTHER): Payer: Medicare Other

## 2022-12-31 VITALS — Ht 72.0 in | Wt 179.0 lb

## 2022-12-31 DIAGNOSIS — Z0000 Encounter for general adult medical examination without abnormal findings: Secondary | ICD-10-CM

## 2022-12-31 NOTE — Patient Instructions (Signed)
Edward Thornton , Thank you for taking time to come for your Medicare Wellness Visit. I appreciate your ongoing commitment to your health goals. Please review the following plan we discussed and let me know if I can assist you in the future.   These are the goals we discussed:  Goals      Manage My Medicine     Timeframe:  Long-Range Goal Priority:  Medium Start Date:       01/27/21                      Expected End Date:   03/21/2023                   Follow Up Date 09/19/2022   - call for medicine refill 2 or 3 days before it runs out - call if I am sick and can't take my medicine - keep a list of all the medicines I take; vitamins and herbals too - use a pillbox to sort medicine   Why is this important?   These steps will help you keep on track with your medicines.   Notes:      Patient Stated     Monitor what I eat more closely and increase the amount of physical activity I do. Maintain current health status, stay as healthy and as independent as possible.        This is a list of the screening recommended for you and due dates:  Health Maintenance  Topic Date Due   COVID-19 Vaccine (8 - 2023-24 season) 11/02/2022   Screening for Lung Cancer  11/17/2023   Medicare Annual Wellness Visit  01/01/2024   Colon Cancer Screening  08/14/2025   DTaP/Tdap/Td vaccine (4 - Td or Tdap) 06/10/2027   Pneumonia Vaccine  Completed   Flu Shot  Completed   Hepatitis C Screening: USPSTF Recommendation to screen - Ages 47-79 yo.  Completed   Zoster (Shingles) Vaccine  Completed   HPV Vaccine  Aged Out    Advanced directives: Please bring a copy of your health care power of attorney and living will to the office to be added to your chart at your convenience.  Conditions/risks identified: Aim for 30 minutes of exercise or brisk walking, 6-8 glasses of water, and 5 servings of fruits and vegetables each day. If you wish to quit smoking, help is available. For free tobacco cessation program  offerings call the [facility] at ([phone] or Live Well Line at [phone]. You may also visit [email or web address] or email [email or web address] for more information on other programs.   You may also call 1-800-QUIT-NOW 307-360-3133) or visit [email or web address] or [email or web address] for additional resources on smoking cessation.   Next appointment: Follow up in one year for your annual wellness visit.   Preventive Care 76 Years and Older, Male  Preventive care refers to lifestyle choices and visits with your health care provider that can promote health and wellness. What does preventive care include? A yearly physical exam. This is also called an annual well check. Dental exams once or twice a year. Routine eye exams. Ask your health care provider how often you should have your eyes checked. Personal lifestyle choices, including: Daily care of your teeth and gums. Regular physical activity. Eating a healthy diet. Avoiding tobacco and drug use. Limiting alcohol use. Practicing safe sex. Taking low doses of aspirin every day. Taking vitamin and mineral supplements as recommended by your health care provider. What  happens during an annual well check? The services and screenings done by your health care provider during your annual well check will depend on your age, overall health, lifestyle risk factors, and family history of disease. Counseling  Your health care provider may ask you questions about your: Alcohol use. Tobacco use. Drug use. Emotional well-being. Home and relationship well-being. Sexual activity. Eating habits. History of falls. Memory and ability to understand (cognition). Work and work Statistician. Screening  You may have the following tests or measurements: Height, weight, and BMI. Blood pressure. Lipid and cholesterol levels. These may be checked every 5 years, or more frequently if you are over 13 years old. Skin check. Lung  cancer screening. You may have this screening every year starting at age 19 if you have a 30-pack-year history of smoking and currently smoke or have quit within the past 15 years. Fecal occult blood test (FOBT) of the stool. You may have this test every year starting at age 60. Flexible sigmoidoscopy or colonoscopy. You may have a sigmoidoscopy every 5 years or a colonoscopy every 10 years starting at age 74. Prostate cancer screening. Recommendations will vary depending on your family history and other risks. Hepatitis C blood test. Hepatitis B blood test. Sexually transmitted disease (STD) testing. Diabetes screening. This is done by checking your blood sugar (glucose) after you have not eaten for a while (fasting). You may have this done every 1-3 years. Abdominal aortic aneurysm (AAA) screening. You may need this if you are a current or former smoker. Osteoporosis. You may be screened starting at age 71 if you are at high risk. Talk with your health care provider about your test results, treatment options, and if necessary, the need for more tests. Vaccines  Your health care provider may recommend certain vaccines, such as: Influenza vaccine. This is recommended every year. Tetanus, diphtheria, and acellular pertussis (Tdap, Td) vaccine. You may need a Td booster every 10 years. Zoster vaccine. You may need this after age 48. Pneumococcal 13-valent conjugate (PCV13) vaccine. One dose is recommended after age 55. Pneumococcal polysaccharide (PPSV23) vaccine. One dose is recommended after age 76. Talk to your health care provider about which screenings and vaccines you need and how often you need them. This information is not intended to replace advice given to you by your health care provider. Make sure you discuss any questions you have with your health care provider. Document Released: 12/23/2015 Document Revised: 08/15/2016 Document Reviewed: 09/27/2015 Elsevier Interactive Patient  Education  2017 [employer] Prevention in the Home Falls can cause injuries. They can happen to people of all ages. There are many things you can do to make your home safe and to help prevent falls. What can I do on the outside of my home? Regularly fix the edges of walkways and driveways and fix any cracks. Remove anything that might make you trip as you walk through a door, such as a raised step or threshold. Trim any bushes or trees on the path to your home. Use bright outdoor lighting. Clear any walking paths of anything that might make someone trip, such as rocks or tools. Regularly check to see if handrails are loose or broken. Make sure that both sides of any steps have handrails. Any raised decks and porches should have guardrails on the edges. Have any leaves, snow, or ice cleared regularly. Use sand or salt on walking paths during winter. Clean up any spills in your garage right away. This includes oil  or grease spills. What can I do in the bathroom? Use night lights. Install grab bars by the toilet and in the tub and shower. Do not use towel bars as grab bars. Use non-skid mats or decals in the tub or shower. If you need to sit down in the shower, use a plastic, non-slip stool. Keep the floor dry. Clean up any water that spills on the floor as soon as it happens. Remove soap buildup in the tub or shower regularly. Attach bath mats securely with double-sided non-slip rug tape. Do not have throw rugs and other things on the floor that can make you trip. What can I do in the bedroom? Use night lights. Make sure that you have a light by your bed that is easy to reach. Do not use any sheets or blankets that are too big for your bed. They should not hang down onto the floor. Have a firm chair that has side arms. You can use this for support while you get dressed. Do not have throw rugs and other things on the floor that can make you trip. What can I do in the  kitchen? Clean up any spills right away. Avoid walking on wet floors. Keep items that you use a lot in easy-to-reach places. If you need to reach something above you, use a strong step stool that has a grab bar. Keep electrical cords out of the way. Do not use floor polish or wax that makes floors slippery. If you must use wax, use non-skid floor wax. Do not have throw rugs and other things on the floor that can make you trip. What can I do with my stairs? Do not leave any items on the stairs. Make sure that there are handrails on both sides of the stairs and use them. Fix handrails that are broken or loose. Make sure that handrails are as long as the stairways. Check any carpeting to make sure that it is firmly attached to the stairs. Fix any carpet that is loose or worn. Avoid having throw rugs at the top or bottom of the stairs. If you do have throw rugs, attach them to the floor with carpet tape. Make sure that you have a light switch at the top of the stairs and the bottom of the stairs. If you do not have them, ask someone to add them for you. What else can I do to help prevent falls? Wear shoes that: Do not have high heels. Have rubber bottoms. Are comfortable and fit you well. Are closed at the toe. Do not wear sandals. If you use a stepladder: Make sure that it is fully opened. Do not climb a closed stepladder. Make sure that both sides of the stepladder are locked into place. Ask someone to hold it for you, if possible. Clearly mark and make sure that you can see: Any grab bars or handrails. First and last steps. Where the edge of each step is. Use tools that help you move around (mobility aids) if they are needed. These include: Canes. Walkers. Scooters. Crutches. Turn on the lights when you go into a dark area. Replace any light bulbs as soon as they burn out. Set up your furniture so you have a clear path. Avoid moving your furniture around. If any of your floors are  uneven, fix them. If there are any pets around you, be aware of where they are. Review your medicines with your doctor. Some medicines can make you feel dizzy. This can  increase your chance of falling. Ask your doctor what other things that you can do to help prevent falls. This information is not intended to replace advice given to you by your health care provider. Make sure you discuss any questions you have with your health care provider. Document Released: 09/22/2009 Document Revised: 05/03/2016 Document Reviewed: 12/31/2014 Elsevier Interactive Patient Education  2017 [employer].

## 2022-12-31 NOTE — Progress Notes (Signed)
Subjective:   Edward Thornton is a 77 y.o. male who presents for Medicare Annual/Subsequent preventive examination.  I connected with  Edward Thornton on 12/31/22 by a audio enabled telemedicine application and verified that I am speaking with the correct person using two identifiers.  Patient Location: Home  Provider Location: Home Office  I discussed the limitations of evaluation and management by telemedicine. The patient expressed understanding and agreed to proceed.   Review of Systems     Cardiac Risk Factors include: advanced age (>11mn, >>56women);smoking/ tobacco exposure;male gender;dyslipidemia;hypertension;Other (see comment), Risk factor comments: PAD, hx of stroke     Objective:    Today's Vitals   12/31/22 1531  Weight: 179 lb (81.2 kg)  Height: 6' (1.829 m)   Body mass index is 24.28 kg/m.     12/31/2022    3:45 PM 12/29/2021   10:34 AM 08/22/2021   10:18 AM 11/08/2020    9:38 AM 09/21/2019    9:15 AM 09/19/2018    9:20 AM 08/22/2016    2:47 PM  Advanced Directives  Does Patient Have a Medical Advance Directive? Yes Yes No Yes Yes Yes Yes  Type of AParamedicof [place] will [place]Living will  HMays ChapelLiving will HPocolaLiving will HReddickLiving will H[place] CreekLiving will  Does patient want to make changes to medical advance directive?    No - Patient declined   No - Patient declined  Copy of HDu [place] Chart? No - copy requested No - copy requested  No - copy requested No - copy requested No - copy requested No - copy requested  Would patient like information on creating a medical advance directive?   No - Patient declined        Current Medications (verified) Outpatient Encounter Medications as of 12/31/2022  Medication Sig   acetaminophen (TYLENOL) 500 MG tablet Take 1,000 mg by mouth every 6 (six)  hours as needed for mild pain.   amLODipine (NORVASC) 10 MG tablet Take 1 tablet (10 mg total) by mouth daily. Annual appt due in Dec must see provider for future refills   atorvastatin (LIPITOR) 40 MG tablet TAKE 1 TABLET BY MOUTH DAILY   clopidogrel (PLAVIX) 75 MG tablet TAKE 1 TABLET BY MOUTH DAILY   Menthol-Methyl Salicylate (SALONPAS PAIN RELIEF PATCH EX) Apply 1 patch topically daily as needed (knee pain).   metoprolol succinate (TOPROL-XL) 50 MG 24 hr tablet TAKE 1 TABLET BY MOUTH DAILY  WITH OR IMMEDIATELY FOLLOWING A  MEAL   Multiple Vitamin (MULTIVITAMIN) tablet Take 1 tablet by mouth daily.   Omega-3 Fatty Acids (FISH OIL) 1200 MG CAPS Take 1 capsule (1,200 mg total) by mouth daily. Take 1 capsule daily (Patient taking differently: Take 1,000 mg by mouth daily.)   triamterene-hydrochlorothiazide (MAXZIDE-25) 37.5-25 MG tablet TAKE 1 TABLET BY MOUTH DAILY   Turmeric 500 MG TABS Take 500 mg by mouth. Take twice per day   hydrocortisone (ANUSOL-HC) 2.5 % rectal cream Place 1 application. rectally 2 (two) times daily. (Patient not taking: Reported on 12/31/2022)   No facility-administered encounter medications on file as of 12/31/2022.    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   Cataract    bilateral sx   Current smoker    Emphysema of lung ([facility]  smoker- pt told per scans (emphysema is seen on the scans)   Hyperlipidemia    on meds   Hyperplastic colon polyp    Hypertension    on meds   PAD (peripheral artery disease) ([facility])    Seasonal allergies    Stroke [facility]) 08/2021   08/2021- tPA administered to patient   Past Surgical History:  Procedure Laterality Date   CATARACT EXTRACTION, BILATERAL Bilateral    COLONOSCOPY  2013   DJ-prep good-int/ext hems-recall 10 yrs   HEMORRHOID BANDING     x3   INGUINAL HERNIA REPAIR Left 12/10/1997   KNEE ARTHROSCOPY Right 12/10/1988    KNEE ARTHROSCOPY Left 12/10/2008   PILONIDAL CYST EXCISION  12/10/1978   spine   Family History  Problem Relation Age of Onset   Stroke Mother    Heart disease Mother    Stroke Father    Heart disease Father    Cancer Sister        type unknown   Heart disease Maternal Grandmother    Heart attack Maternal Grandfather    Colon polyps Paternal Grandmother 65   Colon cancer Paternal Grandmother 51   Stroke Paternal Grandfather    Stomach cancer Neg Hx    Rectal cancer Neg Hx    Esophageal cancer Neg Hx    Social History   Socioeconomic History   Marital status: Married    Spouse name: Not on file   Number of children: 2   Years of education: Not on file   Highest education level: Not on file  Occupational History   Occupation: Insurance account manager, part-time/ retired   Occupation: Tourist information centre manager firm-retired  Tobacco Use   Smoking status: Every Day    Packs/day: 1.00    Years: 50.00    Total pack years: 50.00    Types: Cigarettes    Start date: 12/10/1964   Smokeless tobacco: Never  Vaping Use   Vaping Use: Never used  Substance and Sexual Activity   Alcohol use: Yes    Alcohol/week: 0.0 - 2.0 standard drinks of alcohol    Comment: drinks beer; 3 or 4 a week    Drug use: No   Sexual activity: Yes  Other Topics Concern   Not on file  Social History Narrative   Sons live in [place] and [place] Strain: Low Risk  (12/31/2022)   Overall Financial Resource Strain (CARDIA)    Difficulty of Paying Living Expenses: Not hard at all  Food Insecurity: No Food Insecurity (12/31/2022)   Hunger Vital Sign    Worried About Running Out of Food in the Last Year: Never true    Ran Out of Food in the Last Year: Never true  Transportation Needs: No Transportation Needs (12/31/2022)   PRAPARE - Hydrologist (Medical): No    Lack of Transportation (Non-Medical): No  Physical Activity: Insufficiently Active  (12/31/2022)   Exercise Vital Sign    Days of Exercise per Week: 7 days    Minutes of Exercise per Session: 20 min  Stress: No Stress Concern Present (12/31/2022)   [place]    Feeling of Stress : Not at all  Social Connections: [place] (12/31/2022)   Social Connection and Isolation Panel [NHANES]    Frequency of Communication with Friends and Family: Twice a week    Frequency of Social Gatherings with Friends and Family: Twice  a week    Attends Religious Services: More than 4 times per year    Active Member of Clubs or Organizations: Yes    Attends Music therapist: More than 4 times per year    Marital Status: Married    Tobacco Counseling Ready to quit: Not Answered Counseling given: Not Answered   Clinical Intake:  Pre-visit preparation completed: Yes  Pain : No/denies pain     BMI - recorded: 24.28 Nutritional Status: BMI of 19-24  Normal Nutritional Risks: None Diabetes: No  How often do you need to have someone help you when you read instructions, pamphlets, or other written materials from your doctor or pharmacy?: 1 - Never  Diabetic? no  Interpreter Needed?: No  Information entered by :: Inita Uram, LPN   Activities of Daily Living    12/31/2022    3:46 PM  In your present state of health, do you have any difficulty performing the following activities:  Hearing? 1  Comment has hearing aids  Vision? 0  Difficulty concentrating or making decisions? 0  Walking or climbing stairs? 0  Dressing or bathing? 0  Doing errands, shopping? 0  Preparing Food and eating ? N  Using the Toilet? N  In the past six months, have you accidently leaked urine? N  Do you have problems with loss of bowel control? N  Managing your Medications? N  Managing your Finances? N  Housekeeping or managing your Housekeeping? N    Patient Care Team: Hoyt Koch, MD as PCP -  General (Internal Medicine) Rolm Bookbinder, MD as Consulting Physician (Dermatology) Magdalen Spatz, NP as Nurse Practitioner (Pulmonary Disease) Charlton Haws, [facility] as Pharmacist (Pharmacist) Luberta Mutter, MD as Consulting Physician (Ophthalmology)  Indicate any recent Medical Services you may have received from other than [facility] providers in the past year (date may be approximate).     Assessment:   This is a routine wellness examination for Edward Thornton.  Hearing/Vision screen Hearing Screening - Comments:: Has hearing aids from [place], but doesn't usually wear them. Vision Screening - Comments:: Wears rx glasses - up to date with routine eye exams with [facility]  Dietary issues and exercise activities discussed: Current Exercise Habits: Home exercise routine, Type of exercise: walking, Time (Minutes): 20, Frequency (Times/Week): 5, Weekly Exercise (Minutes/Week): 100, Intensity: Mild, Exercise limited by: orthopedic condition(s) (knee bothers him)   Goals Addressed             This Visit's Progress    Patient Stated   On track    Monitor what I eat more closely and increase the amount of physical activity I do. Maintain current health status, stay as healthy and as independent as possible.       Depression Screen    12/31/2022    3:42 PM 11/12/2022   10:02 AM 12/29/2021   10:35 AM 12/29/2021   10:33 AM 09/26/2021    1:46 PM 11/08/2020    9:36 AM 09/21/2019    9:11 AM  PHQ 2/9 Scores  PHQ - 2 Score 0 0 0 0 0 0 0  PHQ- 9 Score 1 1         Fall Risk    12/31/2022    3:35 PM 12/29/2021   10:34 AM 11/09/2021   10:01 AM 11/08/2020    9:38 AM 09/21/2019    9:11 AM  Fall Risk   Falls in the past year? 0 0 0 1 0  Number falls  in past yr: 0 0 0 0 0  Injury with Fall? 0 0 0 0 0  Risk for fall due to : No Fall Risks   No Fall Risks   Follow up Falls prevention discussed Falls evaluation completed  Falls evaluation completed     Independence:  Any stairs in or around the home? Yes  If so, are there any without handrails? No  Home free of loose throw rugs in walkways, pet beds, electrical cords, etc? Yes  Adequate lighting in your home to reduce risk of falls? Yes   ASSISTIVE DEVICES UTILIZED TO PREVENT FALLS:  Life alert? No  Use of a cane, walker or w/c?  Prn cane Grab bars in the bathroom? Yes  Shower chair or bench in shower? Yes  Elevated toilet seat or a handicapped toilet? Yes   TIMED UP AND GO:  Was the test performed? No . televisit  Cognitive Function:    09/19/2018    9:23 AM  MMSE - Mini Mental State Exam  Not completed: Refused        12/31/2022    3:48 PM 11/08/2020    9:40 AM  6CIT Screen  What Year? 0 points 0 points  What month? 0 points 0 points  What time? 0 points 0 points  Count back from 20 0 points 0 points  Months in reverse 0 points 0 points  Repeat phrase 0 points 0 points  Total Score 0 points 0 points    Immunizations Immunization History  Administered Date(s) Administered   COVID-19, mRNA, vaccine(Comirnaty)12 years and older 09/07/2022   Fluad Quad(high Dose 65+) 08/21/2019, 08/22/2021, 08/28/2022   Influenza Split 10/10/2014, 08/23/2015   Influenza, High Dose Seasonal PF 08/24/2016, 09/16/2017, 09/19/2018   Influenza,inj,Quad PF,6+ Mos 08/23/2015   Influenza-Unspecified 10/02/2011, 09/13/2012, 09/14/2013, 09/29/2014, 08/11/2019, 08/28/2020, 08/24/2021   PFIZER Comirnaty(Gray Top)Covid-19 Tri-Sucrose Vaccine 04/12/2021   PFIZER(Purple Top)SARS-COV-2 Vaccination 01/15/2020, 02/09/2020, 09/10/2020, 04/12/2021   Pfizer Covid-19 Vaccine Bivalent Booster 50yr & up 11/09/2021, 09/07/2022   Pneumococcal Conjugate-13 09/16/2017   Pneumococcal Polysaccharide-23 09/19/2018   Pneumococcal-Unspecified 06/05/2009   Rsv, Bivalent, Protein Subunit Rsvpref,pf (Evans Lance 09/07/2022   Td 06/05/2005   Tdap 12/04/2016, 06/09/2017   Zoster Recombinat (Shingrix)  11/24/2019, 04/11/2020   Zoster, Live 12/10/2008    TDAP status: Up to date  Flu Vaccine status: Up to date  Pneumococcal vaccine status: Up to date  Covid-19 vaccine status: Completed vaccines  Qualifies for Shingles Vaccine? Yes   Zostavax completed Yes   Shingrix Completed?: Yes  Screening Tests Health Maintenance  Topic Date Due   COVID-19 Vaccine (8 - 2023-24 season) 11/02/2022   Lung Cancer Screening  11/17/2023   Medicare Annual Wellness (AWV)  01/01/2024   COLONOSCOPY (Pts 45-419yrInsurance coverage will need to be confirmed)  08/14/2025   DTaP/Tdap/Td (4 - Td or Tdap) 06/10/2027   Pneumonia Vaccine 6535Years old  Completed   INFLUENZA VACCINE  Completed   Hepatitis C Screening  Completed   Zoster Vaccines- Shingrix  Completed   HPV VACCINES  Aged Out    Health Maintenance  Health Maintenance Due  Topic Date Due   COVID-19 Vaccine (8 - 2023-24 season) 11/02/2022    Colorectal cancer screening: Type of screening: Colonoscopy. Completed 08/14/2022. Repeat every 3 years  Lung Cancer Screening: (Low Dose CT Chest recommended if Age 76-80ears, 30 pack-year currently smoking OR have quit w/in 15years.) does qualify.   Lung Cancer Screening Referral: last done  11/16/2022 - repeat annually  Additional Screening:  Hepatitis C Screening: does qualify; Completed 08/24/2016  Vision Screening: Recommended annual ophthalmology exams for early detection of glaucoma and other disorders of the eye. Is the patient up to date with their annual eye exam?  Yes  Who is the provider or what is the name of the office in which the patient attends annual eye exams? [facility] If pt is not established with a provider, would they like to be referred to a provider to establish care? No .   Dental Screening: Recommended annual dental exams for proper oral hygiene  Community Resource Referral / Chronic Care Management: CRR required this visit?  No   CCM required this visit?  No       Plan:     I have personally reviewed and noted the following in the patient's chart:   Medical and social history Use of alcohol, tobacco or illicit drugs  Current medications and supplements including opioid prescriptions. Patient is not currently taking opioid prescriptions. Functional ability and status Nutritional status Physical activity Advanced directives List of other physicians Hospitalizations, surgeries, and ER visits in previous 12 months Vitals Screenings to include cognitive, depression, and falls Referrals and appointments  In addition, I have reviewed and discussed with patient certain preventive protocols, quality metrics, and best practice recommendations. A written personalized care plan for preventive services as well as general preventive health recommendations were provided to patient.     Sandrea Hammond, LPN   [id number]   Nurse Notes: none

## 2023-01-13 ENCOUNTER — Other Ambulatory Visit: Payer: Self-pay | Admitting: Internal Medicine

## 2023-01-15 ENCOUNTER — Ambulatory Visit: Payer: Medicare Other | Admitting: Gastroenterology

## 2023-01-15 ENCOUNTER — Encounter: Payer: Self-pay | Admitting: Gastroenterology

## 2023-01-15 VITALS — BP 140/68 | HR 67 | Ht 72.0 in | Wt 176.0 lb

## 2023-01-15 DIAGNOSIS — K642 Third degree hemorrhoids: Secondary | ICD-10-CM | POA: Diagnosis not present

## 2023-01-15 NOTE — Progress Notes (Signed)
HPI :  77 year old male here for follow up hemorrhoids.  Recall I performed a surveillance colonoscopy for him in September.  He had grade 3 prolapsing hemorrhoids, multiple polyps removed as outlined below.  He has had significant hemorrhoid symptoms for the past 1-1/2 years.  Swelling, bleeding, grade 3 prolapse are his main symptoms. Hemorrhoids were large and inflamed on recent colonoscopy.   He has been on Plavix and we discussed banding at a prior visit. We continued Plavix and performed banding of the LL hemorrhoid at that time on 09/28/22, RP hemorrhoid banded on 10/22/22. After the first 2 bands he did not think he had as much benefit as he would have hoped.  We performed another banding on 12/13/22 -RP hemorrhoid to left lateral area banded.  He is here to follow-up to make sure he is had an appropriate response given he was not feeling too much improvement after his first 2 banding's.  He states that the last banding definitely worked to control his symptoms.  He has not had any further swelling, no bleeding, no prolapse that has bothered him.  Overall he reports doing much better in general in recent weeks compared to prior to starting banding when he was having significant symptoms.  He denies any constipation or straining.  Eating prunes and eating a lot of fruit to keep his stools regular.  He does not take any daily fiber supplements or other products for his bowels.  He feels tissue in his rectal area and wonders if that is prolapse or something else.     Colonoscopy 08/14/22: Hemorrhoids were found on perianal exam (grade III) - Multiple small-mouthed diverticula were found in the sigmoid colon. 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 - A diminutive polyp was found in the cecum. The polyp was sessile. The polyp was removed with a cold snare. Resection and retrieval were complete. - A 3 mm polyp was found in the ileocecal valve. The polyp was sessile. The polyp  was removed with a cold snare. Resection and retrieval were complete. - A 4 to 5 mm polyp was found in the transverse colon. The polyp was sessile. The polyp was removed with a cold snare. Resection and retrieval were complete. - Numerous suspected hyperplastic polyps were found in the left colon. The largest of these were removed as a representative sample via cold snare as outliend. Two sessile polyps were found in the sigmoid colon. The polyps were 4 mm in size. These polyps were removed with a cold snare. Resection and retrieval were complete. - Two sessile polyps were found in the recto-sigmoid colon. The polyps were 3 to 5 mm in size. These polyps were removed with a cold snare. Resection and retrieval were complete. - A 3 to 4 mm polyp was found in the rectum. The polyp was sessile. The polyp was removed with a cold snare. Resection and retrieval were complete. - Internal hemorrhoids were found during retroflexion. The hemorrhoids were large and had associated inflammatory changes consistent with prolapse. - The exam was otherwise without abnormality.   1. Surgical [P], colon, sigmoid, rectosigmoid, rectum, polyp (5) - TUBULAR ADENOMA (S) AND HYPERPLASTIC POLYP (S). - NEGATIVE FOR HIGH-GRADE DYSPLASIA. 2. Surgical [P], colon, transverse, cecum, IC valve, polyp (3) - FRAGMENTS OF A TUBULAR ADENOMA (1). - HYPERPLASTIC POLYP (2). - NEGATIVE FOR HIGH-GRADE DYSPLASIA.   Repeat colon in 3 years     Past Medical History:  Diagnosis Date   Arthritis  Cancer ([place]) 1995   mycosis fungodes   Cataract    bilateral sx   Current smoker    Emphysema of lung ([place])    smoker- pt told per scans (emphysema is seen on the scans)   Hyperlipidemia    on meds   Hyperplastic colon polyp    Hypertension    on meds   PAD (peripheral artery disease) ([facility])    Seasonal allergies    Stroke [facility]) 08/2021   08/2021- tPA administered to patient     Past Surgical History:  Procedure  Laterality Date   CATARACT EXTRACTION, BILATERAL Bilateral    COLONOSCOPY  2013   DJ-prep good-int/ext hems-recall 10 yrs   HEMORRHOID BANDING     x3   INGUINAL HERNIA REPAIR Left 12/10/1997   KNEE ARTHROSCOPY Right 12/10/1988   KNEE ARTHROSCOPY Left 12/10/2008   PILONIDAL CYST EXCISION  12/10/1978   spine   Family History  Problem Relation Age of Onset   Stroke Mother    Heart disease Mother    Stroke Father    Heart disease Father    Cancer Sister        type unknown   Heart disease Maternal Grandmother    Heart attack Maternal Grandfather    Colon polyps Paternal Grandmother 11   Colon cancer Paternal Grandmother 71   Stroke Paternal Grandfather    Stomach cancer Neg Hx    Rectal cancer Neg Hx    Esophageal cancer Neg Hx    Social History   Tobacco Use   Smoking status: Every Day    Packs/day: 1.00    Years: 50.00    Total pack years: 50.00    Types: Cigarettes    Start date: 12/10/1964   Smokeless tobacco: Never  Vaping Use   Vaping Use: Never used  Substance Use Topics   Alcohol use: Yes    Alcohol/week: 0.0 - 2.0 standard drinks of alcohol    Comment: drinks beer; 3 or 4 a week    Drug use: No   Current Outpatient Medications  Medication Sig Dispense Refill   acetaminophen (TYLENOL) 500 MG tablet Take 1,000 mg by mouth every 6 (six) hours as needed for mild pain.     amLODipine (NORVASC) 10 MG tablet TAKE 1 TABLET BY MOUTH DAILY 90 tablet 3   atorvastatin (LIPITOR) 40 MG tablet TAKE 1 TABLET BY MOUTH DAILY 90 tablet 1   clopidogrel (PLAVIX) 75 MG tablet TAKE 1 TABLET BY MOUTH DAILY 90 tablet 1   Menthol-Methyl Salicylate (SALONPAS PAIN RELIEF PATCH EX) Apply 1 patch topically daily as needed (knee pain).     metoprolol succinate (TOPROL-XL) 50 MG 24 hr tablet TAKE 1 TABLET BY MOUTH DAILY  WITH OR IMMEDIATELY FOLLOWING A  MEAL 90 tablet 3   Multiple Vitamin (MULTIVITAMIN) tablet Take 1 tablet by mouth daily.     Omega-3 Fatty Acids (FISH OIL) 1200 MG CAPS  Take 1 capsule (1,200 mg total) by mouth daily. Take 1 capsule daily (Patient taking differently: Take 1,000 mg by mouth daily.) 90 capsule 3   triamterene-hydrochlorothiazide (MAXZIDE-25) 37.5-25 MG tablet TAKE 1 TABLET BY MOUTH DAILY 90 tablet 1   Turmeric 500 MG TABS Take 500 mg by mouth. Take twice per day     No current facility-administered medications for this visit.   Allergies  Allergen Reactions   Losartan Hives and Swelling     Review of Systems: All systems reviewed and negative except where noted in HPI.  Lab Results  Component Value Date   WBC 7.3 11/12/2022   HGB 16.4 11/12/2022   HCT 47.3 11/12/2022   MCV 95.6 11/12/2022   PLT 296.0 11/12/2022     Physical Exam: BP (!) 140/68   Pulse 67   Ht 6' (1.829 m)   Wt 176 lb (79.8 kg)   SpO2 94%   BMI 23.87 kg/m  Constitutional: Pleasant,well-developed, male in no acute distress. DRE - external skin tags, no prolapse - improvement  ASSESSMENT: 77 y.o. male here for assessment of the following  1. Grade III hemorrhoids    Overall, it took some time and multiple bands, however he eventually has gotten good symptomatic relief status post 3 hemorrhoid bands.  He tolerated this well in light of Plavix use.  He has some external skin tags at this time but his prolapse has resolved and overall definitely improved.  At this time no further plans for hemorrhoidal banding.  He should continue high-fiber diet/prudent to keep stools soft, minimize his time spent on the toilet.  If he needs to take anything to help with this can use a daily fiber supplement.  He can follow-up with me as needed should his symptoms recur in the future and he wish to consider additional banding.   PLAN: - continue prunes / high fiber diet, or add fiber supplement to use PRN - no further banding at this time, doing better - has some skin tags - counseled him on this, small, would not recommend surgical removal - repeat colonoscopy 3 years from  his last exam - can f/u PRN if symptoms recur  Jolly Mango, MD [facility]

## 2023-01-29 ENCOUNTER — Other Ambulatory Visit: Payer: Self-pay | Admitting: Internal Medicine

## 2023-02-07 DIAGNOSIS — C8400 Mycosis fungoides, unspecified site: Secondary | ICD-10-CM | POA: Diagnosis not present

## 2023-02-07 DIAGNOSIS — D1801 Hemangioma of skin and subcutaneous tissue: Secondary | ICD-10-CM | POA: Diagnosis not present

## 2023-02-07 DIAGNOSIS — D2239 Melanocytic nevi of other parts of face: Secondary | ICD-10-CM | POA: Diagnosis not present

## 2023-02-07 DIAGNOSIS — L570 Actinic keratosis: Secondary | ICD-10-CM | POA: Diagnosis not present

## 2023-02-07 DIAGNOSIS — Z85828 Personal history of other malignant neoplasm of skin: Secondary | ICD-10-CM | POA: Diagnosis not present

## 2023-02-07 DIAGNOSIS — C44319 Basal cell carcinoma of skin of other parts of face: Secondary | ICD-10-CM | POA: Diagnosis not present

## 2023-02-07 DIAGNOSIS — C4441 Basal cell carcinoma of skin of scalp and neck: Secondary | ICD-10-CM | POA: Diagnosis not present

## 2023-02-07 DIAGNOSIS — L821 Other seborrheic keratosis: Secondary | ICD-10-CM | POA: Diagnosis not present

## 2023-02-14 ENCOUNTER — Encounter: Payer: Self-pay | Admitting: Internal Medicine

## 2023-02-26 ENCOUNTER — Encounter (HOSPITAL_COMMUNITY): Payer: Medicare Other

## 2023-02-26 ENCOUNTER — Ambulatory Visit: Payer: Medicare Other | Admitting: Vascular Surgery

## 2023-05-31 ENCOUNTER — Other Ambulatory Visit: Payer: Self-pay | Admitting: *Deleted

## 2023-05-31 DIAGNOSIS — I739 Peripheral vascular disease, unspecified: Secondary | ICD-10-CM

## 2023-06-03 ENCOUNTER — Ambulatory Visit (HOSPITAL_COMMUNITY)
Admission: RE | Admit: 2023-06-03 | Discharge: 2023-06-03 | Disposition: A | Discharge status: Home / Self Care | Payer: Medicare Other | Source: Ambulatory Visit | Attending: Surgery | Admitting: Surgery

## 2023-06-03 DIAGNOSIS — I739 Peripheral vascular disease, unspecified: Secondary | ICD-10-CM | POA: Diagnosis not present

## 2023-06-03 LAB — VAS US ABI WITH/WO TBI
Left ABI: 0.61
Right ABI: 0.92

## 2023-07-02 ENCOUNTER — Encounter: Payer: Self-pay | Admitting: Vascular Surgery

## 2023-07-02 ENCOUNTER — Ambulatory Visit: Payer: Medicare Other | Admitting: Vascular Surgery

## 2023-07-02 VITALS — BP 145/70 | HR 60 | Temp 98.2°F | Resp 16 | Ht 72.0 in | Wt 172.0 lb

## 2023-07-02 DIAGNOSIS — I739 Peripheral vascular disease, unspecified: Secondary | ICD-10-CM

## 2023-07-02 NOTE — Progress Notes (Signed)
Patient name: Edward Thornton MRN: [id number] DOB: Apr 21, 1946 Sex: male  REASON FOR CONSULT: 6 month follow-up, PAD  HPI: Edward Thornton is a 77 y.o. male, with history of hypertension, hyperlipidemia and tobacco abuse who presents for 6 month follow-up of his PAD.  Patient was previously seen after a home health screen through his insurance and was told he had PAD in the left leg.  He denied any left leg symptoms on initial evaluation and we recommended conservative therapy.    Still smoking about half pack a day.  States he is really able to do most all of his activities without any pain.  He does complain of left knee pain.  Really no classic claudication symptoms.  No rest pain or tissue loss.  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   Cataract    bilateral sx   Current smoker    Emphysema of lung (HCC)    smoker- pt told per scans (emphysema is seen on the scans)   Hyperlipidemia    on meds   Hyperplastic colon polyp    Hypertension    on meds   PAD (peripheral artery disease) (HCC)    Seasonal allergies    Stroke (HCC) 08/2021   08/2021- tPA administered to patient    Past Surgical History:  Procedure Laterality Date   CATARACT EXTRACTION, BILATERAL Bilateral    COLONOSCOPY  2013   DJ-prep good-int/ext hems-recall 10 yrs   HEMORRHOID BANDING     x3   INGUINAL HERNIA REPAIR Left 12/10/1997   KNEE ARTHROSCOPY Right 12/10/1988   KNEE ARTHROSCOPY Left 12/10/2008   PILONIDAL CYST EXCISION  12/10/1978   spine    Family History  Problem Relation Age of Onset   Stroke Mother    Heart disease Mother    Stroke Father    Heart disease Father    Cancer Sister        type unknown   Heart disease Maternal Grandmother    Heart attack Maternal Grandfather    Colon polyps Paternal Grandmother 8   Colon cancer Paternal Grandmother 66   Stroke Paternal Grandfather    Stomach cancer Neg Hx    Rectal cancer Neg Hx     Esophageal cancer Neg Hx     SOCIAL HISTORY: Social History   Socioeconomic History   Marital status: Married    Spouse name: Not on file   Number of children: 2   Years of education: Not on file   Highest education level: Not on file  Occupational History   Occupation: Occupational hygienist, part-time/ retired   Occupation: Estate agent firm-retired  Tobacco Use   Smoking status: Every Day    Current packs/day: 1.00    Average packs/day: 1 pack/day for 58.6 years (58.6 ttl pk-yrs)    Types: Cigarettes    Start date: 12/10/1964   Smokeless tobacco: Never  Vaping Use   Vaping status: Never Used  Substance and Sexual Activity   Alcohol use: Yes    Alcohol/week: 0.0 - 2.0 standard drinks of alcohol    Comment: drinks beer; 3 or 4 a week    Drug use: No   Sexual activity: Yes  Other Topics Concern   Not on file  Social History Narrative   Sons live in [place] and [place]   Social Determinants of Health   Financial Resource Strain: Low Risk  (12/31/2022)   Overall Financial  Resource Strain (CARDIA)    Difficulty of Paying Living Expenses: Not hard at all  Food Insecurity: No Food Insecurity (12/31/2022)   Hunger Vital Sign    Worried About Running Out of Food in the Last Year: Never true    Ran Out of Food in the Last Year: Never true  Transportation Needs: No Transportation Needs (12/31/2022)   PRAPARE - Administrator, Civil Service (Medical): No    Lack of Transportation (Non-Medical): No  Physical Activity: Insufficiently Active (12/31/2022)   Exercise Vital Sign    Days of Exercise per Week: 7 days    Minutes of Exercise per Session: 20 min  Stress: No Stress Concern Present (12/31/2022)   [employer] of Occupational Health - Occupational Stress Questionnaire    Feeling of Stress : Not at all  Social Connections: Socially Integrated (12/31/2022)   Social Connection and Isolation Panel [NHANES]    Frequency of Communication with Friends and Family: Twice  a week    Frequency of Social Gatherings with Friends and Family: Twice a week    Attends Religious Services: More than 4 times per year    Active Member of [employer] or Organizations: Yes    Attends Engineer, structural: More than 4 times per year    Marital Status: Married  Catering manager Violence: Not At Risk (12/31/2022)   Humiliation, Afraid, Rape, and Kick questionnaire    Fear of Current or Ex-Partner: No    Emotionally Abused: No    Physically Abused: No    Sexually Abused: No    Allergies  Allergen Reactions   Losartan Hives and Swelling    Current Outpatient Medications  Medication Sig Dispense Refill   acetaminophen (TYLENOL) 500 MG tablet Take 1,000 mg by mouth every 6 (six) hours as needed for mild pain.     amLODipine (NORVASC) 10 MG tablet TAKE 1 TABLET BY MOUTH DAILY 90 tablet 3   atorvastatin (LIPITOR) 40 MG tablet TAKE 1 TABLET BY MOUTH DAILY 90 tablet 3   clopidogrel (PLAVIX) 75 MG tablet TAKE 1 TABLET BY MOUTH DAILY 90 tablet 3   Menthol-Methyl Salicylate (SALONPAS PAIN RELIEF PATCH EX) Apply 1 patch topically daily as needed (knee pain).     metoprolol succinate (TOPROL-XL) 50 MG 24 hr tablet TAKE 1 TABLET BY MOUTH DAILY  WITH OR IMMEDIATELY FOLLOWING A  MEAL 90 tablet 3   Multiple Vitamin (MULTIVITAMIN) tablet Take 1 tablet by mouth daily.     Omega-3 Fatty Acids (FISH OIL) 1200 MG CAPS Take 1 capsule (1,200 mg total) by mouth daily. Take 1 capsule daily (Patient taking differently: Take 1,000 mg by mouth daily.) 90 capsule 3   triamterene-hydrochlorothiazide (MAXZIDE-25) 37.5-25 MG tablet TAKE 1 TABLET BY MOUTH DAILY 90 tablet 3   Turmeric 500 MG TABS Take 500 mg by mouth. Take twice per day     No current facility-administered medications for this visit.    REVIEW OF SYSTEMS:  [X]  denotes positive finding, [ ]  denotes negative finding Cardiac  Comments:  Chest pain or chest pressure:    Shortness of breath upon exertion:    Short of breath when  lying flat:    Irregular heart rhythm:        Vascular    Pain in calf, thigh, or hip brought on by ambulation:    Pain in feet at night that wakes you up from your sleep:     Blood clot in your veins:    Leg  swelling:         Pulmonary    Oxygen at home:    Productive cough:     Wheezing:         Neurologic    Sudden weakness in arms or legs:     Sudden numbness in arms or legs:     Sudden onset of difficulty speaking or slurred speech:    Temporary loss of vision in one eye:     Problems with dizziness:         Gastrointestinal    Blood in stool:     Vomited blood:         Genitourinary    Burning when urinating:     Blood in urine:        Psychiatric    Major depression:         Hematologic    Bleeding problems:    Problems with blood clotting too easily:        Skin    Rashes or ulcers:        Constitutional    Fever or chills:      PHYSICAL EXAM: Vitals:   07/02/23 1029  BP: (!) 145/70  Pulse: 60  Resp: 16  Temp: 98.2 F (36.8 C)  TempSrc: Temporal  SpO2: 93%  Weight: 172 lb (78 kg)  Height: 6' (1.829 m)    GENERAL: The patient is a well-nourished male, in no acute distress. The vital signs are documented above. CARDIAC: There is a regular rate and rhythm.  VASCULAR:  Right femoral pulse palpable Difficult to appreciate left femoral pulse Right PT palpable No left pedal pulses palpable, no tissue loss PULMONARY: No respiratory distress ABDOMEN: Soft and non-tender. MUSCULOSKELETAL: There are no major deformities or cyanosis. NEUROLOGIC: No focal weakness or paresthesias are detected. PSYCHIATRIC: The patient has a normal affect.  DATA:   ABI's 06/03/23 0.92 right triphasic and 0.61 left monophasic (stable over past 6 months)  Assessment/Plan:  77 year old male that presents for 74-month follow-up of his PAD.  This was initially identified on home health screen where he had a reduced ABI of 0.6 on the left.  This remained stable today.   Again he does not endorse any classic claudication symptoms and has no rest pain or tissue loss to suggest CLI.  I again would not recommend any intervention for asymptomatic PAD.  I again discussed smoking cessation.  He is on Plavix and statin for risk reduction given a previous stroke in the past and discussed this is optimal for his vascular disease.  I discussed walking therapies.  I will see him in 1 year with repeat ABIs.  I discussed him call with any worsening symptoms.   Cephus Shelling, MD [facility] Office: [phone]

## 2023-07-09 ENCOUNTER — Other Ambulatory Visit: Payer: Self-pay

## 2023-07-09 DIAGNOSIS — I739 Peripheral vascular disease, unspecified: Secondary | ICD-10-CM

## 2023-08-07 DIAGNOSIS — Z961 Presence of intraocular lens: Secondary | ICD-10-CM | POA: Diagnosis not present

## 2023-08-07 DIAGNOSIS — H353112 Nonexudative age-related macular degeneration, right eye, intermediate dry stage: Secondary | ICD-10-CM | POA: Diagnosis not present

## 2023-08-07 DIAGNOSIS — H52203 Unspecified astigmatism, bilateral: Secondary | ICD-10-CM | POA: Diagnosis not present

## 2023-08-08 ENCOUNTER — Other Ambulatory Visit: Payer: Self-pay | Admitting: Internal Medicine

## 2023-08-08 DIAGNOSIS — Z85828 Personal history of other malignant neoplasm of skin: Secondary | ICD-10-CM | POA: Diagnosis not present

## 2023-08-08 DIAGNOSIS — L309 Dermatitis, unspecified: Secondary | ICD-10-CM | POA: Diagnosis not present

## 2023-08-08 DIAGNOSIS — C8400 Mycosis fungoides, unspecified site: Secondary | ICD-10-CM | POA: Diagnosis not present

## 2023-08-08 DIAGNOSIS — L821 Other seborrheic keratosis: Secondary | ICD-10-CM | POA: Diagnosis not present

## 2023-08-08 DIAGNOSIS — L570 Actinic keratosis: Secondary | ICD-10-CM | POA: Diagnosis not present

## 2023-08-08 DIAGNOSIS — D485 Neoplasm of uncertain behavior of skin: Secondary | ICD-10-CM | POA: Diagnosis not present

## 2023-08-21 ENCOUNTER — Other Ambulatory Visit (HOSPITAL_COMMUNITY): Payer: Self-pay

## 2023-08-21 MED ORDER — FLUAD 0.5 ML IM SUSY
0.5000 mL | PREFILLED_SYRINGE | INTRAMUSCULAR | 0 refills | Status: AC
  Filled 2023-08-21: qty 0.5, 1d supply, fill #0

## 2023-08-21 MED ORDER — COMIRNATY 30 MCG/0.3ML IM SUSY
0.3000 mL | PREFILLED_SYRINGE | INTRAMUSCULAR | 0 refills | Status: AC
  Filled 2023-08-21: qty 0.3, 1d supply, fill #0

## 2023-09-28 ENCOUNTER — Other Ambulatory Visit: Payer: Self-pay | Admitting: Internal Medicine

## 2023-10-17 ENCOUNTER — Other Ambulatory Visit: Payer: Self-pay | Admitting: Internal Medicine

## 2023-11-18 ENCOUNTER — Ambulatory Visit (HOSPITAL_COMMUNITY)
Admission: RE | Admit: 2023-11-18 | Discharge: 2023-11-18 | Disposition: A | Discharge status: Home / Self Care | Payer: Medicare Other | Source: Ambulatory Visit | Attending: Internal Medicine | Admitting: Internal Medicine

## 2023-11-18 DIAGNOSIS — Z122 Encounter for screening for malignant neoplasm of respiratory organs: Secondary | ICD-10-CM | POA: Insufficient documentation

## 2023-11-18 DIAGNOSIS — Z87891 Personal history of nicotine dependence: Secondary | ICD-10-CM | POA: Insufficient documentation

## 2023-11-18 DIAGNOSIS — I700 Atherosclerosis of aorta: Secondary | ICD-10-CM | POA: Insufficient documentation

## 2023-11-18 DIAGNOSIS — F17210 Nicotine dependence, cigarettes, uncomplicated: Secondary | ICD-10-CM | POA: Diagnosis not present

## 2023-11-18 DIAGNOSIS — J439 Emphysema, unspecified: Secondary | ICD-10-CM | POA: Insufficient documentation

## 2023-11-19 ENCOUNTER — Ambulatory Visit (INDEPENDENT_AMBULATORY_CARE_PROVIDER_SITE_OTHER): Payer: Medicare Other

## 2023-11-19 ENCOUNTER — Encounter: Payer: Self-pay | Admitting: Internal Medicine

## 2023-11-19 ENCOUNTER — Ambulatory Visit (INDEPENDENT_AMBULATORY_CARE_PROVIDER_SITE_OTHER): Payer: Medicare Other | Admitting: Internal Medicine

## 2023-11-19 VITALS — BP 128/60 | HR 70 | Temp 97.8°F | Ht 72.0 in | Wt 176.0 lb

## 2023-11-19 DIAGNOSIS — Z720 Tobacco use: Secondary | ICD-10-CM | POA: Diagnosis not present

## 2023-11-19 DIAGNOSIS — M47816 Spondylosis without myelopathy or radiculopathy, lumbar region: Secondary | ICD-10-CM | POA: Diagnosis not present

## 2023-11-19 DIAGNOSIS — Z0000 Encounter for general adult medical examination without abnormal findings: Secondary | ICD-10-CM | POA: Diagnosis not present

## 2023-11-19 DIAGNOSIS — I10 Essential (primary) hypertension: Secondary | ICD-10-CM

## 2023-11-19 DIAGNOSIS — E782 Mixed hyperlipidemia: Secondary | ICD-10-CM

## 2023-11-19 DIAGNOSIS — M545 Low back pain, unspecified: Secondary | ICD-10-CM | POA: Diagnosis not present

## 2023-11-19 DIAGNOSIS — I739 Peripheral vascular disease, unspecified: Secondary | ICD-10-CM | POA: Diagnosis not present

## 2023-11-19 DIAGNOSIS — Z8673 Personal history of transient ischemic attack (TIA), and cerebral infarction without residual deficits: Secondary | ICD-10-CM | POA: Diagnosis not present

## 2023-11-19 LAB — COMPREHENSIVE METABOLIC PANEL
ALT: 21 U/L (ref 0–53)
AST: 20 U/L (ref 0–37)
Albumin: 4.3 g/dL (ref 3.5–5.2)
Alkaline Phosphatase: 70 U/L (ref 39–117)
BUN: 21 mg/dL (ref 6–23)
CO2: 31 meq/L (ref 19–32)
Calcium: 9.6 mg/dL (ref 8.4–10.5)
Chloride: 100 meq/L (ref 96–112)
Creatinine, Ser: 0.91 mg/dL (ref 0.40–1.50)
GFR: 81.74 mL/min (ref >60.00)
Glucose, Bld: 87 mg/dL (ref 70–99)
Potassium: 3.4 meq/L — ABNORMAL LOW (ref 3.5–5.1)
Sodium: 139 meq/L (ref 135–145)
Total Bilirubin: 0.9 mg/dL (ref 0.2–1.2)
Total Protein: 7.2 g/dL (ref 6.0–8.3)

## 2023-11-19 LAB — CBC
HCT: 48.1 % (ref 39.0–52.0)
Hemoglobin: 16.2 g/dL (ref 13.0–17.0)
MCHC: 33.7 g/dL (ref 30.0–36.0)
MCV: 98.2 fL (ref 78.0–100.0)
Platelets: 260 10*3/uL (ref 150.0–400.0)
RBC: 4.9 Mil/uL (ref 4.22–5.81)
RDW: 13.2 % (ref 11.5–15.5)
WBC: 6.7 10*3/uL (ref 4.0–10.5)

## 2023-11-19 LAB — LIPID PANEL
Cholesterol: 153 mg/dL (ref 0–200)
HDL: 43.3 mg/dL (ref >39.00)
LDL Cholesterol: 78 mg/dL (ref 0–99)
NonHDL: 109.72
Total CHOL/HDL Ratio: 4
Triglycerides: 159 mg/dL — ABNORMAL HIGH (ref 0.0–149.0)
VLDL: 31.8 mg/dL (ref 0.0–40.0)

## 2023-11-19 LAB — HEMOGLOBIN A1C: Hgb A1c MFr Bld: 5.9 % (ref 4.6–6.5)

## 2023-11-19 MED ORDER — HYDROCHLOROTHIAZIDE 25 MG PO TABS: 25.0000 mg | ORAL_TABLET | Freq: Every day | ORAL | 3 refills | Status: DC

## 2023-11-19 MED ORDER — AMLODIPINE BESYLATE 10 MG PO TABS: 10.0000 mg | ORAL_TABLET | Freq: Every day | ORAL | 3 refills | Status: DC

## 2023-11-19 MED ORDER — CLOPIDOGREL BISULFATE 75 MG PO TABS: 75.0000 mg | ORAL_TABLET | Freq: Every day | ORAL | 3 refills | Status: DC

## 2023-11-19 MED ORDER — ATORVASTATIN CALCIUM 40 MG PO TABS: 40.0000 mg | ORAL_TABLET | Freq: Every day | ORAL | 3 refills | Status: DC

## 2023-11-19 MED ORDER — METOPROLOL SUCCINATE ER 50 MG PO TB24: 50.0000 mg | ORAL_TABLET | Freq: Every day | ORAL | 3 refills | Status: DC

## 2023-11-19 MED ORDER — FISH OIL 1200 MG PO CAPS: 1200.0000 mg | ORAL_CAPSULE | Freq: Every day | ORAL | 3 refills | Status: AC

## 2023-11-19 NOTE — Patient Instructions (Addendum)
We will stop the triamterene/hydrochlorothiazide and start hydrochlorothiazide by itself to see if this helps with the itching.

## 2023-11-19 NOTE — Progress Notes (Signed)
   Subjective:   Patient ID: Edward Thornton, male    DOB: 1946/08/21, 77 y.o.   MRN: [id number]  HPI The patient is here for physical.  PMH, [facility], social history reviewed and updated  Review of Systems  Constitutional: Negative.   HENT: Negative.    Eyes: Negative.   Respiratory:  Negative for cough, chest tightness and shortness of breath.   Cardiovascular:  Negative for chest pain, palpitations and leg swelling.  Gastrointestinal:  Negative for abdominal distention, abdominal pain, constipation, diarrhea, nausea and vomiting.  Musculoskeletal:  Positive for arthralgias.  Skin: Negative.   Neurological: Negative.   Psychiatric/Behavioral: Negative.      Objective:  Physical Exam Constitutional:      Appearance: He is well-developed.  HENT:     Head: Normocephalic and atraumatic.  Cardiovascular:     Rate and Rhythm: Normal rate and regular rhythm.  Pulmonary:     Effort: Pulmonary effort is normal. No respiratory distress.     Breath sounds: Normal breath sounds. No wheezing or rales.  Abdominal:     General: Bowel sounds are normal. There is no distension.     Palpations: Abdomen is soft.     Tenderness: There is no abdominal tenderness. There is no rebound.  Musculoskeletal:        General: Tenderness present.     Cervical back: Normal range of motion.  Skin:    General: Skin is warm and dry.  Neurological:     Mental Status: He is alert and oriented to person, place, and time.     Coordination: Coordination normal.     Vitals:   11/19/23 0934  BP: 128/60  Pulse: 70  Temp: 97.8 F (36.6 C)  TempSrc: Oral  SpO2: 97%  Weight: 176 lb (79.8 kg)  Height: 6' (1.829 m)    Assessment & Plan:

## 2023-11-22 DIAGNOSIS — M545 Low back pain, unspecified: Secondary | ICD-10-CM | POA: Insufficient documentation

## 2023-11-22 NOTE — Assessment & Plan Note (Signed)
No new symptoms and checking CMP and CBC and lipid panel and HGA1c and adjust as needed. Taking lipitor 40 mg daily.

## 2023-11-22 NOTE — Assessment & Plan Note (Signed)
BP at goal on amlodipine 10 mg daily and hydrochlorothiazide 25 mg daily and metoprolol 50 mg daily. Checking CMP and adjust as needed.

## 2023-11-22 NOTE — Assessment & Plan Note (Signed)
Taking plavix and statin. No new symptoms.

## 2023-11-22 NOTE — Assessment & Plan Note (Signed)
Checking lipid panel and adjust lipitor 40 mg daily as needed. 

## 2023-11-22 NOTE — Assessment & Plan Note (Signed)
Ordered x-ray lumbar to assess new back pain in last 2-3 weeks.

## 2023-11-22 NOTE — Assessment & Plan Note (Signed)
Counseled to quit and unable to make quit attempt at this time.

## 2023-11-22 NOTE — Assessment & Plan Note (Signed)
Flu shot complete for season. Pneumonia complete. Shingrix complete. Tetanus up to date. Colonoscopy up to date. Counseled about sun safety and mole surveillance. Counseled about the dangers of distracted driving. Given 10 year screening recommendations.

## 2023-11-29 NOTE — Progress Notes (Signed)
Noted  

## 2023-12-02 ENCOUNTER — Other Ambulatory Visit: Payer: Self-pay

## 2023-12-02 DIAGNOSIS — Z87891 Personal history of nicotine dependence: Secondary | ICD-10-CM

## 2023-12-02 DIAGNOSIS — F17210 Nicotine dependence, cigarettes, uncomplicated: Secondary | ICD-10-CM

## 2023-12-02 DIAGNOSIS — Z122 Encounter for screening for malignant neoplasm of respiratory organs: Secondary | ICD-10-CM

## 2024-01-02 ENCOUNTER — Ambulatory Visit: Payer: Medicare Other

## 2024-01-02 VITALS — Ht 72.0 in | Wt 176.0 lb

## 2024-01-02 DIAGNOSIS — Z0000 Encounter for general adult medical examination without abnormal findings: Secondary | ICD-10-CM

## 2024-01-02 NOTE — Patient Instructions (Signed)
Edward Thornton , Thank you for taking time to come for your Medicare Wellness Visit. I appreciate your ongoing commitment to your health goals. Please review the following plan we discussed and let me know if I can assist you in the future.   Referrals/Orders/Follow-Ups/Clinician Recommendations: It was nice talking to you today.  Keep up the good work.  This is a list of the screening recommended for you and due dates:  Health Maintenance  Topic Date Due   Screening for Lung Cancer  11/17/2024   Medicare Annual Wellness Visit  01/01/2025   Colon Cancer Screening  08/14/2025   DTaP/Tdap/Td vaccine (4 - Td or Tdap) 06/10/2027   Pneumonia Vaccine  Completed   Flu Shot  Completed   COVID-19 Vaccine  Completed   Hepatitis C Screening  Completed   Zoster (Shingles) Vaccine  Completed   HPV Vaccine  Aged Out    Advanced directives: (Copy Requested) Please bring a copy of your health care power of attorney and living will to the office to be added to your chart at your convenience.  Next Medicare Annual Wellness Visit scheduled for next year: Yes

## 2024-01-02 NOTE — Progress Notes (Signed)
Subjective:   Edward Thornton is a 78 y.o. male who presents for Medicare Annual/Subsequent preventive examination.  Visit Complete: Virtual I connected with  Edward Thornton on 01/02/24 by a audio enabled telemedicine application and verified that I am speaking with the correct person using two identifiers.  Patient Location: Home  Provider Location: Office/Clinic  I discussed the limitations of evaluation and management by telemedicine. The patient expressed understanding and agreed to proceed.  Vital Signs: Because this visit was a virtual/telehealth visit, some criteria may be missing or patient reported. Any vitals not documented were not able to be obtained and vitals that have been documented are patient reported.   Cardiac Risk Factors include: advanced age (>26men, >42 women);male gender;hypertension;Other (see comment);dyslipidemia, Risk factor comments: PAD, BPV,     Objective:    Today's Vitals   01/02/24 1500  Weight: 176 lb (79.8 kg)  Height: 6' (1.829 m)   Body mass index is 23.87 kg/m.     01/02/2024    3:10 PM 12/31/2022    3:45 PM 12/29/2021   10:34 AM 08/22/2021   10:18 AM 11/08/2020    9:38 AM 09/21/2019    9:15 AM 09/19/2018    9:20 AM  Advanced Directives  Does Patient Have a Medical Advance Directive? Yes Yes Yes No Yes Yes Yes  Type of Estate agent of [place];Living will Healthcare Power of [place];Living will Healthcare Power of [place];Living will  Healthcare Power of [place];Living will Healthcare Power of [place];Living will Healthcare Power of [place];Living will  Does patient want to make changes to medical advance directive?     No - Patient declined    Copy of Healthcare Power of Attorney in Chart? No - copy requested No - copy requested No - copy requested  No - copy requested No - copy requested No - copy requested  Would patient like information on creating a medical advance directive?    No - Patient declined        Current Medications (verified) Outpatient Encounter Medications as of 01/02/2024  Medication Sig   acetaminophen (TYLENOL) 500 MG tablet Take 1,000 mg by mouth every 6 (six) hours as needed for mild pain.   amLODipine (NORVASC) 10 MG tablet Take 1 tablet (10 mg total) by mouth daily.   atorvastatin (LIPITOR) 40 MG tablet Take 1 tablet (40 mg total) by mouth daily.   clopidogrel (PLAVIX) 75 MG tablet Take 1 tablet (75 mg total) by mouth daily.   hydrochlorothiazide (HYDRODIURIL) 25 MG tablet Take 1 tablet (25 mg total) by mouth daily.   Menthol-Methyl Salicylate (SALONPAS PAIN RELIEF PATCH EX) Apply 1 patch topically daily as needed (knee pain).   metoprolol succinate (TOPROL-XL) 50 MG 24 hr tablet Take 1 tablet (50 mg total) by mouth daily. Take with or immediately following a meal.   Multiple Vitamin (MULTIVITAMIN) tablet Take 1 tablet by mouth daily.   Omega-3 Fatty Acids (FISH OIL) 1200 MG CAPS Take 1 capsule (1,200 mg total) by mouth daily. Take 1 capsule daily   Turmeric 500 MG TABS Take 500 mg by mouth. Take twice per day   No facility-administered encounter medications on file as of 01/02/2024.    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   Cataract    bilateral sx   Current smoker    Emphysema of lung (HCC)    smoker- pt told  per scans (emphysema is seen on the scans)   Hyperlipidemia    on meds   Hyperplastic colon polyp    Hypertension    on meds   PAD (peripheral artery disease) (HCC)    Seasonal allergies    Stroke [facility]) 08/2021   08/2021- tPA administered to patient   Past Surgical History:  Procedure Laterality Date   CATARACT EXTRACTION, BILATERAL Bilateral    COLONOSCOPY  2013   DJ-prep good-int/ext hems-recall 10 yrs   HEMORRHOID BANDING     x3   INGUINAL HERNIA REPAIR Left 12/10/1997   KNEE ARTHROSCOPY Right 12/10/1988   KNEE ARTHROSCOPY Left 12/10/2008    PILONIDAL CYST EXCISION  12/10/1978   spine   Family History  Problem Relation Age of Onset   Stroke Mother    Heart disease Mother    Stroke Father    Heart disease Father    Cancer Sister        type unknown   Heart disease Maternal Grandmother    Heart attack Maternal Grandfather    Colon polyps Paternal Grandmother 29   Colon cancer Paternal Grandmother 60   Stroke Paternal Grandfather    Stomach cancer Neg Hx    Rectal cancer Neg Hx    Esophageal cancer Neg Hx    Social History   Socioeconomic History   Marital status: Married    Spouse name: Jasmine December   Number of children: 2   Years of education: Not on file   Highest education level: Not on file  Occupational History   Occupation: Occupational hygienist, part-time/ retired   Occupation: Estate agent firm-retired  Tobacco Use   Smoking status: Every Day    Current packs/day: 1.00    Average packs/day: 1 pack/day for 59.1 years (59.1 ttl pk-yrs)    Types: Cigarettes    Start date: 12/10/1964   Smokeless tobacco: Never  Vaping Use   Vaping status: Never Used  Substance and Sexual Activity   Alcohol use: Yes    Alcohol/week: 0.0 - 2.0 standard drinks of alcohol    Comment: drinks beer; 3 or 4 a week    Drug use: No   Sexual activity: Yes  Other Topics Concern   Not on file  Social History Narrative   Sons live in [place] and [place]   Lives with his husband-2025   Social Drivers of Health   Financial Resource Strain: Low Risk  (01/02/2024)   Overall Financial Resource Strain (CARDIA)    Difficulty of Paying Living Expenses: Not very hard  Food Insecurity: No Food Insecurity (01/02/2024)   Hunger Vital Sign    Worried About Running Out of Food in the Last Year: Never true    Ran Out of Food in the Last Year: Never true  Transportation Needs: No Transportation Needs (01/02/2024)   PRAPARE - Administrator, Civil Service (Medical): No    Lack of Transportation (Non-Medical): No  Physical Activity:  Sufficiently Active (01/02/2024)   Exercise Vital Sign    Days of Exercise per Week: 3 days    Minutes of Exercise per Session: 60 min  Stress: No Stress Concern Present (01/02/2024)   [employer] of Occupational Health - Occupational Stress Questionnaire    Feeling of Stress : Not at all  Social Connections: Socially Integrated (01/02/2024)   Social Connection and Isolation Panel [NHANES]    Frequency of Communication with Friends and Family: More than three times a week    Frequency of Social Gatherings  with Friends and Family: Three times a week    Attends Religious Services: More than 4 times per year    Active Member of Clubs or Organizations: Yes    Attends Banker Meetings: Never    Marital Status: Married    Tobacco Counseling Ready to quit: Not Answered Counseling given: Not Answered   Clinical Intake:  Pre-visit preparation completed: Yes  Pain : No/denies pain     BMI - recorded: 23.87 Nutritional Status: BMI of 19-24  Normal Nutritional Risks: None Diabetes: No  How often do you need to have someone help you when you read instructions, pamphlets, or other written materials from your doctor or pharmacy?: 1 - Never     Information entered by :: Bendell Sundance Moise, RMA   Activities of Daily Living    01/02/2024    3:00 PM  In your present state of health, do you have any difficulty performing the following activities:  Hearing? 1  Comment has hearing aides  Vision? 0  Difficulty concentrating or making decisions? 0  Walking or climbing stairs? 0  Dressing or bathing? 0  Doing errands, shopping? 0  Preparing Food and eating ? N  Using the Toilet? N  In the past six months, have you accidently leaked urine? N  Do you have problems with loss of bowel control? N  Managing your Medications? N  Managing your Finances? N  Housekeeping or managing your Housekeeping? N    Patient Care Team: Myrlene Broker, MD as PCP - General (Internal  Medicine) Venancio Poisson, MD as Consulting Physician (Dermatology) Bevelyn Ngo, NP as Nurse Practitioner (Pulmonary Disease) Kathyrn Sheriff, [facility] (Inactive) as Pharmacist (Pharmacist) Maris Berger, MD as Consulting Physician (Ophthalmology)  Indicate any recent Medical Services you may have received from other than [facility] providers in the past year (date may be approximate).     Assessment:   This is a routine wellness examination for Raza.  Hearing/Vision screen Hearing Screening - Comments:: Hearing aides Vision Screening - Comments:: Wears eyeglasses   Goals Addressed             This Visit's Progress    Patient Stated   On track    Monitor what I eat more closely and increase the amount of physical activity I do. Maintain current health status, stay as healthy and as independent as possible.      Depression Screen    01/02/2024    3:16 PM 12/31/2022    3:42 PM 11/12/2022   10:02 AM 12/29/2021   10:35 AM 12/29/2021   10:33 AM 09/26/2021    1:46 PM 11/08/2020    9:36 AM  PHQ 2/9 Scores  PHQ - 2 Score 3 0 0 0 0 0 0  PHQ- 9 Score 3 1 1         Fall Risk    01/02/2024    3:10 PM 11/19/2023    9:43 AM 11/19/2023    9:34 AM 12/31/2022    3:35 PM 12/29/2021   10:34 AM  Fall Risk   Falls in the past year? 0 0 0 0 0  Number falls in past yr: 0 0 0 0 0  Injury with Fall? 0 0 0 0 0  Risk for fall due to : No Fall Risks   No Fall Risks   Follow up Falls prevention discussed;Falls evaluation completed Falls evaluation completed Falls evaluation completed Falls prevention discussed Falls evaluation completed    MEDICARE RISK AT HOME: Medicare  Risk at Home Any stairs in or around the home?: Yes If so, are there any without handrails?: Yes Adequate lighting in your home to reduce risk of falls?: Yes Life alert?: No Use of a cane, walker or w/c?: No Grab bars in the bathroom?: Yes Shower chair or bench in shower?: Yes Elevated toilet seat or a handicapped toilet?:  Yes  TIMED UP AND GO:  Was the test performed?  No    Cognitive Function:    09/19/2018    9:23 AM 08/02/2016    9:39 AM  MMSE - Mini Mental State Exam  Not completed: Refused --        01/02/2024    3:01 PM 12/31/2022    3:48 PM 11/08/2020    9:40 AM  6CIT Screen  What Year? 0 points 0 points 0 points  What month? 0 points 0 points 0 points  What time? 0 points 0 points 0 points  Count back from 20 0 points 0 points 0 points  Months in reverse 0 points 0 points 0 points  Repeat phrase 0 points 0 points 0 points  Total Score 0 points 0 points 0 points    Immunizations Immunization History  Administered Date(s) Administered   Fluad Quad(high Dose 65+) 08/21/2019, 08/22/2021, 08/28/2022   Fluad Trivalent(High Dose 65+) 08/21/2023   Influenza Split 10/10/2014, 08/23/2015   Influenza, High Dose Seasonal PF 08/24/2016, 09/16/2017, 09/19/2018   Influenza,inj,Quad PF,6+ Mos 08/23/2015   Influenza-Unspecified 10/02/2011, 09/13/2012, 09/14/2013, 09/29/2014, 08/11/2019, 08/28/2020, 08/24/2021   PFIZER Comirnaty(Gray Top)Covid-19 Tri-Sucrose Vaccine 04/12/2021   PFIZER(Purple Top)SARS-COV-2 Vaccination 01/15/2020, 02/09/2020, 09/10/2020, 04/12/2021   Pfizer Covid-19 Vaccine Bivalent Booster 30yrs & up 11/09/2021, 09/07/2022   Pfizer(Comirnaty)Fall Seasonal Vaccine 12 years and older 09/07/2022, 08/21/2023   Pneumococcal Conjugate-13 09/16/2017   Pneumococcal Polysaccharide-23 09/19/2018   Pneumococcal-Unspecified 06/05/2009   Rsv, Bivalent, Protein Subunit Rsvpref,pf Verdis Frederickson) 09/07/2022   Td 06/05/2005   Tdap 12/04/2016, 06/09/2017   Zoster Recombinant(Shingrix) 11/24/2019, 04/11/2020   Zoster, Live 12/10/2008    TDAP status: Up to date  Flu Vaccine status: Up to date  Pneumococcal vaccine status: Up to date  Covid-19 vaccine status: Completed vaccines  Qualifies for Shingles Vaccine? Yes   Zostavax completed Yes   Shingrix Completed?: Yes  Screening Tests Health  Maintenance  Topic Date Due   Lung Cancer Screening  11/17/2024   Medicare Annual Wellness (AWV)  01/01/2025   Colonoscopy  08/14/2025   DTaP/Tdap/Td (4 - Td or Tdap) 06/10/2027   Pneumonia Vaccine 68+ Years old  Completed   INFLUENZA VACCINE  Completed   COVID-19 Vaccine  Completed   Hepatitis C Screening  Completed   Zoster Vaccines- Shingrix  Completed   HPV VACCINES  Aged Out    Health Maintenance  There are no preventive care reminders to display for this patient.   Colorectal cancer screening: Type of screening: Colonoscopy. Completed 08/14/2022. Repeat every 3 years  Lung Cancer Screening: (Low Dose CT Chest recommended if Age 75-80 years, 20 pack-year currently smoking OR have quit w/in 15years.) does qualify.   Lung Cancer Screening Referral: 11/18/2023  Additional Screening:  Hepatitis C Screening: does qualify; Completed 08/24/2016  Vision Screening: Recommended annual ophthalmology exams for early detection of glaucoma and other disorders of the eye. Is the patient up to date with their annual eye exam?  Yes  Who is the provider or what is the name of the office in which the patient attends annual eye exams? Dr. Charlotte Sanes If pt is not established  with a provider, would they like to be referred to a provider to establish care? No .   Dental Screening: Recommended annual dental exams for proper oral hygiene   Community Resource Referral / Chronic Care Management: CRR required this visit?  No   CCM required this visit?  No     Plan:     I have personally reviewed and noted the following in the patient's chart:   Medical and social history Use of alcohol, tobacco or illicit drugs  Current medications and supplements including opioid prescriptions. Patient is not currently taking opioid prescriptions. Functional ability and status Nutritional status Physical activity Advanced directives List of other physicians Hospitalizations, surgeries, and ER visits in  previous 12 months Vitals Screenings to include cognitive, depression, and falls Referrals and appointments  In addition, I have reviewed and discussed with patient certain preventive protocols, quality metrics, and best practice recommendations. A written personalized care plan for preventive services as well as general preventive health recommendations were provided to patient.     Leni Pankonin L Whitney Bingaman, CMA   01/02/2024   After Visit Summary: (MyChart) Due to this being a telephonic visit, the after visit summary with patients personalized plan was offered to patient via MyChart   Nurse Notes: Patient is up to date on all health maintenance with no concerns to address today.

## 2024-01-03 ENCOUNTER — Encounter: Payer: Medicare Other | Admitting: Internal Medicine

## 2024-02-14 DIAGNOSIS — D485 Neoplasm of uncertain behavior of skin: Secondary | ICD-10-CM | POA: Diagnosis not present

## 2024-02-14 DIAGNOSIS — D225 Melanocytic nevi of trunk: Secondary | ICD-10-CM | POA: Diagnosis not present

## 2024-02-14 DIAGNOSIS — D2239 Melanocytic nevi of other parts of face: Secondary | ICD-10-CM | POA: Diagnosis not present

## 2024-02-14 DIAGNOSIS — C8400 Mycosis fungoides, unspecified site: Secondary | ICD-10-CM | POA: Diagnosis not present

## 2024-02-14 DIAGNOSIS — Z85828 Personal history of other malignant neoplasm of skin: Secondary | ICD-10-CM | POA: Diagnosis not present

## 2024-02-14 DIAGNOSIS — L570 Actinic keratosis: Secondary | ICD-10-CM | POA: Diagnosis not present

## 2024-02-14 DIAGNOSIS — L821 Other seborrheic keratosis: Secondary | ICD-10-CM | POA: Diagnosis not present

## 2024-02-14 DIAGNOSIS — L988 Other specified disorders of the skin and subcutaneous tissue: Secondary | ICD-10-CM | POA: Diagnosis not present

## 2024-02-14 DIAGNOSIS — D1801 Hemangioma of skin and subcutaneous tissue: Secondary | ICD-10-CM | POA: Diagnosis not present

## 2024-02-14 DIAGNOSIS — L2989 Other pruritus: Secondary | ICD-10-CM | POA: Diagnosis not present

## 2024-02-16 ENCOUNTER — Encounter: Payer: Self-pay | Admitting: Internal Medicine

## 2024-02-17 ENCOUNTER — Other Ambulatory Visit: Payer: Self-pay

## 2024-02-17 MED ORDER — HYDROCHLOROTHIAZIDE 25 MG PO TABS: 25.0000 mg | ORAL_TABLET | Freq: Every day | ORAL | 1 refills | Status: DC

## 2024-07-09 ENCOUNTER — Other Ambulatory Visit: Payer: Self-pay | Admitting: *Deleted

## 2024-07-09 DIAGNOSIS — I739 Peripheral vascular disease, unspecified: Secondary | ICD-10-CM

## 2024-07-25 ENCOUNTER — Other Ambulatory Visit: Payer: Self-pay | Admitting: Internal Medicine

## 2024-08-04 ENCOUNTER — Encounter: Payer: Self-pay | Admitting: Vascular Surgery

## 2024-08-04 ENCOUNTER — Ambulatory Visit (HOSPITAL_COMMUNITY)
Admission: RE | Admit: 2024-08-04 | Discharge: 2024-08-04 | Disposition: A | Discharge status: Home / Self Care | Source: Ambulatory Visit | Attending: Vascular Surgery | Admitting: Vascular Surgery

## 2024-08-04 ENCOUNTER — Ambulatory Visit: Attending: Vascular Surgery | Admitting: Vascular Surgery

## 2024-08-04 VITALS — BP 131/67 | HR 54 | Temp 98.3°F | Resp 18 | Ht 72.0 in | Wt 175.3 lb

## 2024-08-04 DIAGNOSIS — I739 Peripheral vascular disease, unspecified: Secondary | ICD-10-CM

## 2024-08-04 LAB — VAS US ABI WITH/WO TBI
Left ABI: 0.65
Right ABI: 1.04

## 2024-08-04 MED ORDER — CILOSTAZOL 50 MG PO TABS
50.0000 mg | ORAL_TABLET | Freq: Two times a day (BID) | ORAL | 11 refills | Status: AC
Start: 2024-08-04 — End: ?

## 2024-08-04 NOTE — Progress Notes (Signed)
 Patient name: Edward Thornton MRN: [id number] DOB: 08-02-1946 Sex: male  REASON FOR CONSULT: 1 year follow-up, PAD  HPI: Edward Thornton is a 78 y.o. male, with history of hypertension, hyperlipidemia and tobacco abuse who presents for 1 year follow-up of his PAD.  Patient was previously seen after a home health screen through his insurance and was told he had PAD in the left leg.  He denied any left leg symptoms on initial evaluation and we recommended conservative therapy.    States he has noticed his left leg is cramping after walking about 10 to 15 minutes on the treadmill.  Walking about 1 mile at least 3 times a week in the gym.  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   Cataract    bilateral sx   Current smoker    Emphysema of lung (HCC)    smoker- pt told per scans (emphysema is seen on the scans)   Hyperlipidemia    on meds   Hyperplastic colon polyp    Hypertension    on meds   PAD (peripheral artery disease) (HCC)    Seasonal allergies    Stroke (HCC) 08/2021   08/2021- tPA administered to patient    Past Surgical History:  Procedure Laterality Date   CATARACT EXTRACTION, BILATERAL Bilateral    COLONOSCOPY  2013   DJ-prep good-int/ext hems-recall 10 yrs   HEMORRHOID BANDING     x3   INGUINAL HERNIA REPAIR Left 12/10/1997   KNEE ARTHROSCOPY Right 12/10/1988   KNEE ARTHROSCOPY Left 12/10/2008   PILONIDAL CYST EXCISION  12/10/1978   spine    Family History  Problem Relation Age of Onset   Stroke Mother    Heart disease Mother    Stroke Father    Heart disease Father    Cancer Sister        type unknown   Heart disease Maternal Grandmother    Heart attack Maternal Grandfather    Colon polyps Paternal Grandmother 6   Colon cancer Paternal Grandmother 67   Stroke Paternal Grandfather    Stomach cancer Neg Hx    Rectal cancer Neg Hx    Esophageal cancer Neg Hx     SOCIAL HISTORY: Social History    Socioeconomic History   Marital status: Married    Spouse name: Reena   Number of children: 2   Years of education: Not on file   Highest education level: Not on file  Occupational History   Occupation: Occupational hygienist, part-time/ retired   Occupation: Estate agent firm-retired  Tobacco Use   Smoking status: Every Day    Current packs/day: 1.00    Average packs/day: 1 pack/day for 59.6 years (59.6 ttl pk-yrs)    Types: Cigarettes    Start date: 12/10/1964   Smokeless tobacco: Never  Vaping Use   Vaping status: Never Used  Substance and Sexual Activity   Alcohol use: Yes    Alcohol/week: 0.0 - 2.0 standard drinks of alcohol    Comment: drinks beer; 3 or 4 a week    Drug use: No   Sexual activity: Yes  Other Topics Concern   Not on file  Social History Narrative   Sons live in [place]  and [place]    Lives with his husband-2025   Social Drivers of Health   Financial Resource Strain: Low Risk  (01/02/2024)   Overall Financial Resource Strain (CARDIA)    Difficulty  of Paying Living Expenses: Not very hard  Food Insecurity: No Food Insecurity (01/02/2024)   Hunger Vital Sign    Worried About Running Out of Food in the Last Year: Never true    Ran Out of Food in the Last Year: Never true  Transportation Needs: No Transportation Needs (01/02/2024)   PRAPARE - Administrator, Civil Service (Medical): No    Lack of Transportation (Non-Medical): No  Physical Activity: Sufficiently Active (01/02/2024)   Exercise Vital Sign    Days of Exercise per Week: 3 days    Minutes of Exercise per Session: 60 min  Stress: No Stress Concern Present (01/02/2024)   [employer] of Occupational Health - Occupational Stress Questionnaire    Feeling of Stress : Not at all  Social Connections: Socially Integrated (01/02/2024)   Social Connection and Isolation Panel    Frequency of Communication with Friends and Family: More than three times a week    Frequency of Social  Gatherings with Friends and Family: Three times a week    Attends Religious Services: More than 4 times per year    Active Member of Clubs or Organizations: Yes    Attends Banker Meetings: Never    Marital Status: Married  Catering manager Violence: Not At Risk (01/02/2024)   Humiliation, Afraid, Rape, and Kick questionnaire    Fear of Current or Ex-Partner: No    Emotionally Abused: No    Physically Abused: No    Sexually Abused: No    Allergies  Allergen Reactions   Losartan  Hives and Swelling    Current Outpatient Medications  Medication Sig Dispense Refill   acetaminophen  (TYLENOL ) 500 MG tablet Take 1,000 mg by mouth every 6 (six) hours as needed for mild pain.     amLODipine  (NORVASC ) 10 MG tablet Take 1 tablet (10 mg total) by mouth daily. 90 tablet 3   atorvastatin  (LIPITOR) 40 MG tablet Take 1 tablet (40 mg total) by mouth daily. 90 tablet 3   calcium  carbonate (OS-CAL) 1250 (500 Ca) MG chewable tablet Chew 1 tablet by mouth daily.     clopidogrel  (PLAVIX ) 75 MG tablet Take 1 tablet (75 mg total) by mouth daily. 90 tablet 3   hydrochlorothiazide  (HYDRODIURIL ) 25 MG tablet TAKE 1 TABLET BY MOUTH DAILY 100 tablet 0   Menthol-Methyl Salicylate (SALONPAS PAIN RELIEF PATCH EX) Apply 1 patch topically daily as needed (knee pain).     metoprolol  succinate (TOPROL -XL) 50 MG 24 hr tablet Take 1 tablet (50 mg total) by mouth daily. Take with or immediately following a meal. 90 tablet 3   Multiple Vitamin (MULTIVITAMIN) tablet Take 1 tablet by mouth daily.     Omega-3 Fatty Acids (FISH OIL ) 1200 MG CAPS Take 1 capsule (1,200 mg total) by mouth daily. Take 1 capsule daily 90 capsule 3   Turmeric 500 MG TABS Take 500 mg by mouth. Take twice per day     No current facility-administered medications for this visit.    REVIEW OF SYSTEMS:  [X]  denotes positive finding, [ ]  denotes negative finding Cardiac  Comments:  Chest pain or chest pressure:    Shortness of breath upon  exertion:    Short of breath when lying flat:    Irregular heart rhythm:        Vascular    Pain in calf, thigh, or hip brought on by ambulation: x left  Pain in feet at night that wakes you up from your sleep:  Blood clot in your veins:    Leg swelling:         Pulmonary    Oxygen at home:    Productive cough:     Wheezing:         Neurologic    Sudden weakness in arms or legs:     Sudden numbness in arms or legs:     Sudden onset of difficulty speaking or slurred speech:    Temporary loss of vision in one eye:     Problems with dizziness:         Gastrointestinal    Blood in stool:     Vomited blood:         Genitourinary    Burning when urinating:     Blood in urine:        Psychiatric    Major depression:         Hematologic    Bleeding problems:    Problems with blood clotting too easily:        Skin    Rashes or ulcers:        Constitutional    Fever or chills:      PHYSICAL EXAM: Vitals:   08/04/24 1216  BP: 131/67  Pulse: (!) 54  Resp: 18  Temp: 98.3 F (36.8 C)  TempSrc: Temporal  SpO2: 92%  Weight: 175 lb 4.8 oz (79.5 kg)  Height: 6' (1.829 m)    GENERAL: The patient is a well-nourished male, in no acute distress. The vital signs are documented above. CARDIAC: There is a regular rate and rhythm.  VASCULAR:  Right femoral pulse palpable Difficult to appreciate left femoral pulse Right PT palpable No left pedal pulses palpable, no tissue loss PULMONARY: No respiratory distress ABDOMEN: Soft and non-tender. MUSCULOSKELETAL: There are no major deformities or cyanosis. NEUROLOGIC: No focal weakness or paresthesias are detected. PSYCHIATRIC: The patient has a normal affect.  DATA:   ABI's today are stable at 1.04 right biphasic and 0.65 left monophasic   Assessment/Plan:  78 year old male that presents for 1 year follow-up of his PAD.  This was initially identified on home health screen where he had a reduced ABI of 0.6 on the left.   This remained stable today.  He does endorses claudication today for the first time in the left leg.  States he is walking about 10 to 15 minutes on the treadmill before he notices this in his left leg.  I discussed this is likely related underlying occlusive disease.  Given that he is able to grocery shop and do all of his yard work and most of his activities I think medical therapy is still the right decision.  I have encouraged him to keep walking.  I talked about smoking cessation.  He is on statin Plavix .  I have started Pletal  50 mg twice daily.  I will see him in 6 months with repeat ABI's.  Lonni DOROTHA Gaskins, MD [facility] Office: [phone]

## 2024-08-05 ENCOUNTER — Other Ambulatory Visit: Payer: Self-pay

## 2024-08-05 DIAGNOSIS — I739 Peripheral vascular disease, unspecified: Secondary | ICD-10-CM

## 2024-08-05 DIAGNOSIS — H52203 Unspecified astigmatism, bilateral: Secondary | ICD-10-CM | POA: Diagnosis not present

## 2024-08-05 DIAGNOSIS — Z961 Presence of intraocular lens: Secondary | ICD-10-CM | POA: Diagnosis not present

## 2024-08-05 DIAGNOSIS — H353112 Nonexudative age-related macular degeneration, right eye, intermediate dry stage: Secondary | ICD-10-CM | POA: Diagnosis not present

## 2024-08-20 ENCOUNTER — Telehealth: Payer: Self-pay

## 2024-08-20 NOTE — Telephone Encounter (Signed)
 Copied from CRM #[id number]. Topic: General - Other >> Aug 20, 2024 12:00 PM Sasha M wrote: Reason for CRM: pt is requesting a covid vax script to be sent to [facility] [address] [place] [address] Phone: [phone] Fax: [phone] Hours: Mon-Fri 7:30a-7p; Sat 8a-4:30p; [place] 10a-2p

## 2024-08-21 ENCOUNTER — Other Ambulatory Visit (HOSPITAL_COMMUNITY): Payer: Self-pay

## 2024-08-21 MED ORDER — COVID-19 MRNA VAC-TRIS(PFIZER) 30 MCG/0.3ML IM SUSY
0.5000 mL | PREFILLED_SYRINGE | Freq: Once | INTRAMUSCULAR | 0 refills | Status: AC
  Filled 2024-08-21: qty 0.5, 1d supply, fill #0
  Filled 2024-08-24: qty 0.3, 1d supply, fill #0

## 2024-08-21 NOTE — Telephone Encounter (Signed)
 Spoke with wife and informed her that this has been sent in

## 2024-08-21 NOTE — Telephone Encounter (Signed)
 Sent in

## 2024-08-24 ENCOUNTER — Other Ambulatory Visit (HOSPITAL_COMMUNITY): Payer: Self-pay

## 2024-08-24 MED ORDER — FLUZONE HIGH-DOSE 0.5 ML IM SUSY
0.5000 mL | PREFILLED_SYRINGE | Freq: Once | INTRAMUSCULAR | 0 refills | Status: AC
  Filled 2024-08-24: qty 0.5, 1d supply, fill #0

## 2024-09-15 ENCOUNTER — Other Ambulatory Visit: Payer: Self-pay | Admitting: Internal Medicine

## 2024-10-12 ENCOUNTER — Other Ambulatory Visit: Payer: Self-pay | Admitting: Internal Medicine

## 2024-10-15 ENCOUNTER — Ambulatory Visit

## 2024-11-18 ENCOUNTER — Ambulatory Visit (HOSPITAL_COMMUNITY)
Admission: RE | Admit: 2024-11-18 | Discharge: 2024-11-18 | Disposition: A | Discharge status: Home / Self Care | Source: Ambulatory Visit | Attending: Internal Medicine | Admitting: Internal Medicine

## 2024-11-18 DIAGNOSIS — Z87891 Personal history of nicotine dependence: Secondary | ICD-10-CM | POA: Diagnosis present

## 2024-11-18 DIAGNOSIS — F17210 Nicotine dependence, cigarettes, uncomplicated: Secondary | ICD-10-CM | POA: Insufficient documentation

## 2024-11-18 DIAGNOSIS — Z122 Encounter for screening for malignant neoplasm of respiratory organs: Secondary | ICD-10-CM | POA: Diagnosis present

## 2024-11-19 ENCOUNTER — Encounter: Payer: Medicare Other | Admitting: Internal Medicine

## 2024-11-20 ENCOUNTER — Ambulatory Visit: Admitting: Internal Medicine

## 2024-11-20 ENCOUNTER — Encounter: Payer: Self-pay | Admitting: Internal Medicine

## 2024-11-20 VITALS — BP 132/60 | HR 72 | Temp 98.3°F | Ht 72.0 in | Wt 174.0 lb

## 2024-11-20 DIAGNOSIS — E782 Mixed hyperlipidemia: Secondary | ICD-10-CM

## 2024-11-20 DIAGNOSIS — Z8673 Personal history of transient ischemic attack (TIA), and cerebral infarction without residual deficits: Secondary | ICD-10-CM

## 2024-11-20 DIAGNOSIS — Z0000 Encounter for general adult medical examination without abnormal findings: Secondary | ICD-10-CM | POA: Diagnosis not present

## 2024-11-20 DIAGNOSIS — Z720 Tobacco use: Secondary | ICD-10-CM

## 2024-11-20 DIAGNOSIS — J439 Emphysema, unspecified: Secondary | ICD-10-CM | POA: Insufficient documentation

## 2024-11-20 DIAGNOSIS — I739 Peripheral vascular disease, unspecified: Secondary | ICD-10-CM

## 2024-11-20 DIAGNOSIS — R7303 Prediabetes: Secondary | ICD-10-CM | POA: Insufficient documentation

## 2024-11-20 DIAGNOSIS — I10 Essential (primary) hypertension: Secondary | ICD-10-CM

## 2024-11-20 LAB — PSA: PSA: 2.75 ng/mL (ref 0.10–4.00)

## 2024-11-20 LAB — CBC
HCT: 46.4 % (ref 39.0–52.0)
Hemoglobin: 15.7 g/dL (ref 13.0–17.0)
MCHC: 33.8 g/dL (ref 30.0–36.0)
MCV: 96 fl (ref 78.0–100.0)
Platelets: 261 K/uL (ref 150.0–400.0)
RBC: 4.83 Mil/uL (ref 4.22–5.81)
RDW: 12.9 % (ref 11.5–15.5)
WBC: 5.9 K/uL (ref 4.0–10.5)

## 2024-11-20 LAB — COMPREHENSIVE METABOLIC PANEL WITH GFR
ALT: 17 U/L (ref 0–53)
AST: 17 U/L (ref 0–37)
Albumin: 4.2 g/dL (ref 3.5–5.2)
Alkaline Phosphatase: 75 U/L (ref 39–117)
BUN: 13 mg/dL (ref 6–23)
CO2: 31 meq/L (ref 19–32)
Calcium: 9.8 mg/dL (ref 8.4–10.5)
Chloride: 100 meq/L (ref 96–112)
Creatinine, Ser: 0.91 mg/dL (ref 0.40–1.50)
GFR: 81.16 mL/min (ref >60.00)
Glucose, Bld: 131 mg/dL — ABNORMAL HIGH (ref 70–99)
Potassium: 3.5 meq/L (ref 3.5–5.1)
Sodium: 140 meq/L (ref 135–145)
Total Bilirubin: 0.8 mg/dL (ref 0.2–1.2)
Total Protein: 6.8 g/dL (ref 6.0–8.3)

## 2024-11-20 LAB — LIPID PANEL
Cholesterol: 127 mg/dL (ref 0–200)
HDL: 47.6 mg/dL (ref >39.00)
LDL Cholesterol: 53 mg/dL (ref 0–99)
NonHDL: 79.7
Total CHOL/HDL Ratio: 3
Triglycerides: 132 mg/dL (ref 0.0–149.0)
VLDL: 26.4 mg/dL (ref 0.0–40.0)

## 2024-11-20 LAB — HEMOGLOBIN A1C: Hgb A1c MFr Bld: 5.8 % (ref 4.6–6.5)

## 2024-11-20 NOTE — Assessment & Plan Note (Signed)
 No recurrent symptoms. BP at goal and checking lipid panel and HGA1c.

## 2024-11-20 NOTE — Assessment & Plan Note (Signed)
 Flu shot yearly. Pneumonia complete. Shingrix complete. Tetanus up to date. Colonoscopy due 2026. Lung cancer screening done but not read yet. Counseled about sun safety and mole surveillance. Counseled about the dangers of distracted driving. Given 10 year screening recommendations.

## 2024-11-20 NOTE — Assessment & Plan Note (Signed)
Checking lipid panel and adjust statin as needed.  

## 2024-11-20 NOTE — Assessment & Plan Note (Signed)
Checking HgA1c. Adjust as needed. 

## 2024-11-20 NOTE — Assessment & Plan Note (Signed)
 Counseled to quit smoking.

## 2024-11-20 NOTE — Progress Notes (Signed)
 Subjective:   Patient ID: Edward Thornton, male    DOB: 22-Jun-1946, 78 y.o.   MRN: [id number]  The patient is here for physical. Pertinent topics discussed: Discussed the use of AI scribe software for clinical note transcription with the patient, who gave verbal consent to proceed.  History of Present Illness Edward Thornton is a 78 year old male who presents with circulation issues in the left leg and persistent itching.  He has been experiencing reduced circulation in his left leg. He has not noticed any new problems and states that his health seems 'kind of the same'.  He continues to experience itching, which he describes as a nuisance. The itching occurs in different areas and at different times, with some areas being as large as a bug bite. He uses a cream prescribed by a dermatologist, which helps control the itching but does not eliminate it. He has been bitten by insects in the past but does not believe this is related.  No new chest pain, tightness, or breathing troubles. He recently underwent a lung cancer screening but has not yet received the results. He has started taking Allegra for nasal drip and sneezing, which he attributes to dry leaves and pollen.  He reports occasional blood on toilet paper due to hemorrhoids but denies any new stomach troubles, diarrhea, constipation, or heartburn. He is up to date on his colonoscopy, with the next one due in September 2026.  He experiences intermittent joint pain, particularly in his back and knees. He received a cortisone shot in his left knee. He takes turmeric for its anti-inflammatory properties and remains active, doing yard work and walking in his neighborhood. He occasionally uses a cane for stability due to knee instability.  No new vision or hearing problems. He sees a dermatologist for skin issues and has had two skin biopsies, both of which were benign. He continues to smoke and has not reduced his smoking significantly.  No recent  episodes of vertigo or dizziness but keeps Benadryl on hand as a precaution. He has a hearing test scheduled in February and plans to clean his ears beforehand.  PMH, [facility], social history reviewed and updated  Review of Systems  Constitutional: Negative.   HENT: Negative.    Eyes: Negative.   Respiratory:  Negative for cough, chest tightness and shortness of breath.   Cardiovascular:  Negative for chest pain, palpitations and leg swelling.  Gastrointestinal:  Negative for abdominal distention, abdominal pain, constipation, diarrhea, nausea and vomiting.  Musculoskeletal: Negative.   Skin: Negative.   Neurological: Negative.   Psychiatric/Behavioral: Negative.      Objective:  Physical Exam Constitutional:      Appearance: He is well-developed.  HENT:     Head: Normocephalic and atraumatic.  Cardiovascular:     Rate and Rhythm: Normal rate and regular rhythm.  Pulmonary:     Effort: Pulmonary effort is normal. No respiratory distress.     Breath sounds: Normal breath sounds. No wheezing or rales.  Abdominal:     General: Bowel sounds are normal. There is no distension.     Palpations: Abdomen is soft.     Tenderness: There is no abdominal tenderness.  Musculoskeletal:     Cervical back: Normal range of motion.  Skin:    General: Skin is warm and dry.  Neurological:     Mental Status: He is alert and oriented to person, place, and time.     Coordination: Coordination normal.  Vitals:   11/20/24 1014  BP: 132/60  Pulse: 72  Temp: 98.3 F (36.8 C)  TempSrc: Oral  SpO2: 96%  Weight: 174 lb (78.9 kg)  Height: 6' (1.829 m)    Assessment & Plan:

## 2024-11-20 NOTE — Assessment & Plan Note (Signed)
 BP at goal on amlodipine  and metoprolol  and hydrochlorothiazide . Checking CMP and adjust as needed.

## 2024-11-20 NOTE — Assessment & Plan Note (Signed)
 Recent start of pletal  by vasc surgery and continue lipitor. Encouraged to quit smoking.

## 2024-11-20 NOTE — Assessment & Plan Note (Signed)
 Noted on CT scan and current smoker. He is encouraged to quit smoking. Is not having significant SOB on exertion to need inhaler at this time. Continue monitoring.

## 2024-11-23 ENCOUNTER — Ambulatory Visit: Payer: Self-pay | Admitting: Internal Medicine

## 2024-12-14 ENCOUNTER — Other Ambulatory Visit: Payer: Self-pay | Admitting: Internal Medicine

## 2024-12-21 ENCOUNTER — Other Ambulatory Visit: Payer: Self-pay | Admitting: Internal Medicine

## 2025-01-04 ENCOUNTER — Ambulatory Visit: Payer: Medicare Other

## 2025-01-05 ENCOUNTER — Ambulatory Visit

## 2025-01-05 VITALS — Ht 72.0 in | Wt 174.0 lb

## 2025-01-05 DIAGNOSIS — Z0000 Encounter for general adult medical examination without abnormal findings: Secondary | ICD-10-CM | POA: Diagnosis not present

## 2025-01-05 NOTE — Progress Notes (Signed)
 "  Chief Complaint  Patient presents with   Medicare Wellness     Subjective:  Please attest and cosign this visit due to patients primary care provider not being in the office at the time the visit was completed.  (Pt of Dr Almarie Cleveland)   Edward Thornton is a 79 y.o. male who presents for a Medicare Annual Wellness Visit.  Visit info / Clinical Intake: Medicare Wellness Visit Type:: Subsequent Annual Wellness Visit Persons participating in visit and providing information:: patient Medicare Wellness Visit Mode:: Video Since this visit was completed virtually, some vitals may be partially provided or unavailable. Missing vitals are due to the limitations of the virtual format.: Documented vitals are patient reported If Telephone or Video please confirm:: I connected with patient using audio/video enable telemedicine. I verified patient identity with two identifiers, discussed telehealth limitations, and patient agreed to proceed. Patient Location:: Home Provider Location:: Office Interpreter Needed?: No Pre-visit prep was completed: yes AWV questionnaire completed by patient prior to visit?: no Living arrangements:: lives with spouse/significant other Patient's Overall Health Status Rating: good Typical amount of pain: none Does pain affect daily life?: no Are you currently prescribed opioids?: no  Dietary Habits and Nutritional Risks How many meals a day?: 2 Eats fruit and vegetables daily?: yes Most meals are obtained by: preparing own meals; eating out In the last 2 weeks, have you had any of the following?: none Diabetic:: no  Functional Status Activities of Daily Living (to include ambulation/medication): Independent Ambulation: Independent with device- listed below Home Assistive Devices/Equipment: [place]; Eyeglasses Medication Administration: Independent Home Management (perform basic housework or laundry): Independent Manage your own finances?: yes Primary  transportation is: driving Concerns about vision?: no *vision screening is required for WTM* Concerns about hearing?: (!) yes Uses hearing aids?: (!) yes Hear whispered voice?: yes  Fall Screening Falls in the past year?: 0 Number of falls in past year: 0 Was there an injury with Fall?: 0 Fall Risk Category Calculator: 0 Patient Fall Risk Level: Low Fall Risk  Fall Risk Patient at Risk for Falls Due to: No Fall Risks Fall risk Follow up: Falls evaluation completed; Falls prevention discussed  Home and Transportation Safety: All rugs have non-skid backing?: N/A, no rugs All stairs or steps have railings?: yes Grab bars in the bathtub or shower?: yes Have non-skid surface in bathtub or shower?: yes Good home lighting?: yes Regular seat belt use?: yes Hospital stays in the last year:: no  Cognitive Assessment Difficulty concentrating, remembering, or making decisions? : no Will 6CIT or Mini Cog be Completed: yes What year is it?: 0 points What month is it?: 0 points Give patient an address phrase to remember (5 components): [address] [place], [place] About what time is it?: 0 points Count backwards from 20 to 1: 0 points Say the months of the year in reverse: 0 points Repeat the address phrase from earlier: 0 points 6 CIT Score: 0 points  Advance Directives (For Healthcare) Does Patient Have a Medical Advance Directive?: Yes Does patient want to make changes to medical advance directive?: Yes (Inpatient - patient requests chaplain consult to change a medical advance directive) Type of Advance Directive: Healthcare Power of [place]; Living will Copy of Healthcare Power of Attorney in Chart?: No - copy requested Copy of Living Will in Chart?: No - copy requested  Reviewed/Updated  Reviewed/Updated: Reviewed All (Medical, Surgical, Family, Medications, Allergies, Care Teams, Patient Goals)    Allergies (verified) Losartan    Current Medications (  verified) Outpatient  Encounter Medications as of 01/05/2025  Medication Sig   acetaminophen  (TYLENOL ) 500 MG tablet Take 1,000 mg by mouth every 6 (six) hours as needed for mild pain.   amLODipine  (NORVASC ) 10 MG tablet TAKE 1 TABLET BY MOUTH DAILY   atorvastatin  (LIPITOR) 40 MG tablet TAKE 1 TABLET BY MOUTH DAILY   calcium  carbonate (OS-CAL) 1250 (500 Ca) MG chewable tablet Chew 1 tablet by mouth daily.   cilostazol  (PLETAL ) 50 MG tablet Take 1 tablet (50 mg total) by mouth 2 (two) times daily.   clopidogrel  (PLAVIX ) 75 MG tablet TAKE 1 TABLET BY MOUTH DAILY   hydrochlorothiazide  (HYDRODIURIL ) 25 MG tablet TAKE 1 TABLET BY MOUTH DAILY   Menthol-Methyl Salicylate (SALONPAS PAIN RELIEF PATCH EX) Apply 1 patch topically daily as needed (knee pain).   metoprolol  succinate (TOPROL -XL) 50 MG 24 hr tablet TAKE 1 TABLET BY MOUTH DAILY  WITH OR IMMEDIATELY FOLLOWING A  MEAL   Multiple Vitamin (MULTIVITAMIN) tablet Take 1 tablet by mouth daily.   Omega-3 Fatty Acids (FISH OIL ) 1200 MG CAPS Take 1 capsule (1,200 mg total) by mouth daily. Take 1 capsule daily   Turmeric 500 MG TABS Take 500 mg by mouth. Take twice per day   No facility-administered encounter medications on file as of 01/05/2025.    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   Cataract    bilateral sx   Current smoker    Emphysema of lung (HCC)    smoker- pt told per scans (emphysema is seen on the scans)   Emphysema, unspecified (HCC) 11/20/2024   Hyperlipidemia    on meds   Hyperplastic colon polyp    Hypertension    on meds   PAD (peripheral artery disease)    Seasonal allergies    Stroke (HCC) 08/2021   08/2021- tPA administered to patient   Past Surgical History:  Procedure Laterality Date   CATARACT EXTRACTION, BILATERAL Bilateral    COLONOSCOPY  2013   DJ-prep good-int/ext hems-recall 10 yrs   HEMORRHOID BANDING     x3   INGUINAL HERNIA REPAIR Left 12/10/1997   KNEE  ARTHROSCOPY Right 12/10/1988   KNEE ARTHROSCOPY Left 12/10/2008   PILONIDAL CYST EXCISION  12/10/1978   spine   Family History  Problem Relation Age of Onset   Stroke Mother    Heart disease Mother    Stroke Father    Heart disease Father    Cancer Sister        type unknown   Heart disease Maternal Grandmother    Heart attack Maternal Grandfather    Colon polyps Paternal Grandmother 67   Colon cancer Paternal Grandmother 53   Stroke Paternal Grandfather    Stomach cancer Neg Hx    Rectal cancer Neg Hx    Esophageal cancer Neg Hx    Social History   Occupational History   Occupation: Occupational Hygienist, part-time/ retired   Occupation: Estate Agent firm-retired  Tobacco Use   Smoking status: Every Day    Current packs/day: 1.00    Average packs/day: 1 pack/day for 60.1 years (60.1 ttl pk-yrs)    Types: Cigarettes    Start date: 12/10/1964   Smokeless tobacco: Never  Vaping Use   Vaping status: Never Used  Substance and Sexual Activity   Alcohol use: Yes    Alcohol/week: 1.0 - 3.0 standard drink of alcohol    Types: 1 Cans of  beer per week    Comment: drinks beer; 3 or 4 a week    Drug use: No   Sexual activity: Yes   Tobacco Counseling Ready to quit: No Counseling given: Yes  SDOH Screenings   Food Insecurity: No Food Insecurity (01/05/2025)  Housing: Unknown (01/05/2025)  Transportation Needs: No Transportation Needs (01/05/2025)  Utilities: Not At Risk (01/05/2025)  Alcohol Screen: Low Risk (01/02/2024)  Depression (PHQ2-9): Low Risk (01/05/2025)  Financial Resource Strain: Low Risk (01/02/2024)  Physical Activity: Sufficiently Active (01/05/2025)  Social Connections: Socially Integrated (01/05/2025)  Stress: No Stress Concern Present (01/05/2025)  Tobacco Use: High Risk (01/05/2025)  Health Literacy: Adequate Health Literacy (01/05/2025)   See flowsheets for full screening details  Depression Screen PHQ 2 & 9 Depression Scale- Over the past 2 weeks, how often  have you been bothered by any of the following problems? Little interest or pleasure in doing things: 0 Feeling down, depressed, or hopeless (PHQ Adolescent also includes...irritable): 0 PHQ-2 Total Score: 0 Trouble falling or staying asleep, or sleeping too much: 0 Feeling tired or having little energy: 0 Poor appetite or overeating (PHQ Adolescent also includes...weight loss): 0 Feeling bad about yourself - or that you are a failure or have let yourself or your family down: 0 Trouble concentrating on things, such as reading the newspaper or watching television (PHQ Adolescent also includes...like school work): 0 Moving or speaking so slowly that other people could have noticed. Or the opposite - being so fidgety or restless that you have been moving around a lot more than usual: 0 Thoughts that you would be better off dead, or of hurting yourself in some way: 0 PHQ-9 Total Score: 0 If you checked off any problems, how difficult have these problems made it for you to do your work, take care of things at home, or get along with other people?: Not difficult at all  Depression Treatment Depression Interventions/Treatment : [id number] Score <4 Follow-up Not Indicated     Goals Addressed               This Visit's Progress     Patient Stated (pt-stated)        Patient stated he plans to continue watching his diet and exercising             Objective:    Today's Vitals   01/05/25 1346  Weight: 174 lb (78.9 kg)  Height: 6' (1.829 m)   Body mass index is 23.6 kg/m.  Hearing/Vision screen Hearing Screening - Comments:: Wears hearing aids [facility] - appt in 01/2025) Vision Screening - Comments:: Wears rx glasses - up to date with routine eye exams with [facility]  Immunizations and Health Maintenance Health Maintenance  Topic Date Due   COVID-19 Vaccine (9 - 2025-26 season) 02/21/2025   Colonoscopy  08/14/2025   Lung Cancer Screening  11/18/2025   Medicare Annual  Wellness (AWV)  01/05/2026   DTaP/Tdap/Td (4 - Td or Tdap) 06/10/2027   Pneumococcal Vaccine: 50+ Years  Completed   Influenza Vaccine  Completed   Hepatitis C Screening  Completed   Zoster Vaccines- Shingrix  Completed   Meningococcal B Vaccine  Aged Out        Assessment/Plan:  This is a routine wellness examination for Edward Thornton.  Patient Care Team: Rollene Almarie LABOR, MD as PCP - General (Internal Medicine) Cary Doffing, MD as Consulting Physician (Dermatology) Ruthell Lauraine FALCON, NP as Nurse Practitioner (Pulmonary Disease) Fate Morna SAILOR, [facility] (Inactive) as Pharmacist (  Pharmacist) Leslee Reusing, MD as Consulting Physician (Ophthalmology)  I have personally reviewed and noted the following in the patients chart:   Medical and social history Use of alcohol, tobacco or illicit drugs  Current medications and supplements including opioid prescriptions. Functional ability and status Nutritional status Physical activity Advanced directives List of other physicians Hospitalizations, surgeries, and ER visits in previous 12 months Vitals Screenings to include cognitive, depression, and falls Referrals and appointments  No orders of the defined types were placed in this encounter.  In addition, I have reviewed and discussed with patient certain preventive protocols, quality metrics, and best practice recommendations. A written personalized care plan for preventive services as well as general preventive health recommendations were provided to patient.   Edward Thornton, CMA   01/05/2025   Return in 1 year (on 01/05/2026).  After Visit Summary: (MyChart) Due to this being a telephonic visit, the after visit summary with patients personalized plan was offered to patient via MyChart   Nurse Notes: scheduled 2027 AWV appt "

## 2025-01-05 NOTE — Patient Instructions (Addendum)
 Mr. Suski,  Thank you for taking the time for your Medicare Wellness Visit. I appreciate your continued commitment to your health goals. Please review the care plan we discussed, and feel free to reach out if I can assist you further.  Please note that Annual Wellness Visits do not include a physical exam. Some assessments may be limited, especially if the visit was conducted virtually. If needed, we may recommend an in-person follow-up with your provider.  Ongoing Care Seeing your primary care provider every 3 to 6 months helps us  monitor your health and provide consistent, personalized care.   Referrals If a referral was made during today's visit and you haven't received any updates within two weeks, please contact the referred provider directly to check on the status.  Recommended Screenings:  Health Maintenance  Topic Date Due   COVID-19 Vaccine (9 - 2025-26 season) 02/21/2025   Colon Cancer Screening  08/14/2025   Screening for Lung Cancer  11/18/2025   Medicare Annual Wellness Visit  01/05/2026   DTaP/Tdap/Td vaccine (4 - Td or Tdap) 06/10/2027   Pneumococcal Vaccine for age over 64  Completed   Flu Shot  Completed   Hepatitis C Screening  Completed   Zoster (Shingles) Vaccine  Completed   Meningitis B Vaccine  Aged Out       01/02/2024    3:10 PM  Advanced Directives  Does Patient Have a Medical Advance Directive? Yes  Type of Estate Agent of [place];Living will  Copy of Healthcare Power of Attorney in Chart? No - copy requested    Vision: Annual vision screenings are recommended for early detection of glaucoma, cataracts, and diabetic retinopathy. These exams can also reveal signs of chronic conditions such as diabetes and high blood pressure.  Dental: Annual dental screenings help detect early signs of oral cancer, gum disease, and other conditions linked to overall health, including heart disease and diabetes.

## 2025-02-09 ENCOUNTER — Ambulatory Visit: Admitting: Vascular Surgery

## 2025-02-09 ENCOUNTER — Encounter (HOSPITAL_COMMUNITY)

## 2025-02-16 ENCOUNTER — Ambulatory Visit (HOSPITAL_COMMUNITY)

## 2025-02-16 ENCOUNTER — Ambulatory Visit: Admitting: Vascular Surgery

## 2025-11-22 ENCOUNTER — Encounter: Admitting: Internal Medicine

## 2026-01-06 ENCOUNTER — Ambulatory Visit
